# Patient Record
Sex: Female | Born: 1950 | Race: White | Hispanic: No | Smoking: Never smoker
Health system: Southern US, Community
[De-identification: ages and names within clinical notes are randomized; demographics above are authoritative.]

## PROBLEM LIST (undated history)

## (undated) DIAGNOSIS — E119 Type 2 diabetes mellitus without complications: Secondary | ICD-10-CM

## (undated) DIAGNOSIS — I499 Cardiac arrhythmia, unspecified: Secondary | ICD-10-CM

## (undated) DIAGNOSIS — F32A Depression, unspecified: Secondary | ICD-10-CM

## (undated) DIAGNOSIS — K760 Fatty (change of) liver, not elsewhere classified: Secondary | ICD-10-CM

## (undated) DIAGNOSIS — G8929 Other chronic pain: Secondary | ICD-10-CM

## (undated) DIAGNOSIS — E1140 Type 2 diabetes mellitus with diabetic neuropathy, unspecified: Secondary | ICD-10-CM

## (undated) DIAGNOSIS — G4730 Sleep apnea, unspecified: Secondary | ICD-10-CM

## (undated) DIAGNOSIS — T7840XA Allergy, unspecified, initial encounter: Secondary | ICD-10-CM

## (undated) DIAGNOSIS — I10 Essential (primary) hypertension: Secondary | ICD-10-CM

## (undated) DIAGNOSIS — F419 Anxiety disorder, unspecified: Secondary | ICD-10-CM

## (undated) DIAGNOSIS — J45909 Unspecified asthma, uncomplicated: Secondary | ICD-10-CM

## (undated) DIAGNOSIS — F329 Major depressive disorder, single episode, unspecified: Secondary | ICD-10-CM

## (undated) DIAGNOSIS — K7460 Unspecified cirrhosis of liver: Secondary | ICD-10-CM

## (undated) DIAGNOSIS — M1990 Unspecified osteoarthritis, unspecified site: Secondary | ICD-10-CM

## (undated) DIAGNOSIS — K219 Gastro-esophageal reflux disease without esophagitis: Secondary | ICD-10-CM

## (undated) DIAGNOSIS — K5790 Diverticulosis of intestine, part unspecified, without perforation or abscess without bleeding: Secondary | ICD-10-CM

## (undated) DIAGNOSIS — G629 Polyneuropathy, unspecified: Secondary | ICD-10-CM

## (undated) DIAGNOSIS — R05 Cough: Secondary | ICD-10-CM

## (undated) DIAGNOSIS — M549 Dorsalgia, unspecified: Secondary | ICD-10-CM

## (undated) HISTORY — DX: Unspecified asthma, uncomplicated: J45.909

## (undated) HISTORY — PX: PARTIAL HYSTERECTOMY: SHX80

## (undated) HISTORY — PX: FOOT SURGERY: SHX648

## (undated) HISTORY — DX: Gastro-esophageal reflux disease without esophagitis: K21.9

## (undated) HISTORY — DX: Allergy, unspecified, initial encounter: T78.40XA

## (undated) HISTORY — DX: Diverticulosis of intestine, part unspecified, without perforation or abscess without bleeding: K57.90

## (undated) HISTORY — DX: Depression, unspecified: F32.A

## (undated) HISTORY — DX: Type 2 diabetes mellitus with diabetic neuropathy, unspecified: E11.40

## (undated) HISTORY — DX: Anxiety disorder, unspecified: F41.9

## (undated) HISTORY — PX: CARPAL TUNNEL RELEASE: SHX101

## (undated) HISTORY — DX: Cough: R05

## (undated) HISTORY — DX: Major depressive disorder, single episode, unspecified: F32.9

## (undated) HISTORY — PX: TOENAIL EXCISION: SUR558

## (undated) HISTORY — DX: Dorsalgia, unspecified: M54.9

## (undated) HISTORY — DX: Essential (primary) hypertension: I10

## (undated) HISTORY — DX: Other chronic pain: G89.29

## (undated) HISTORY — DX: Unspecified osteoarthritis, unspecified site: M19.90

## (undated) HISTORY — PX: CARDIAC ELECTROPHYSIOLOGY STUDY AND ABLATION: SHX1294

## (undated) HISTORY — PX: OTHER SURGICAL HISTORY: SHX169

## (undated) HISTORY — PX: HEMORROIDECTOMY: SUR656

---
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
## 2002-05-24 ENCOUNTER — Ambulatory Visit (HOSPITAL_COMMUNITY): Admission: RE | Admit: 2002-05-24 | Discharge: 2002-05-24 | Payer: Self-pay | Admitting: *Deleted

## 2002-05-24 ENCOUNTER — Encounter (INDEPENDENT_AMBULATORY_CARE_PROVIDER_SITE_OTHER): Payer: Self-pay | Admitting: Cardiology

## 2008-08-21 ENCOUNTER — Ambulatory Visit: Payer: Self-pay | Admitting: Internal Medicine

## 2009-05-18 DIAGNOSIS — F341 Dysthymic disorder: Secondary | ICD-10-CM | POA: Insufficient documentation

## 2009-05-18 DIAGNOSIS — I10 Essential (primary) hypertension: Secondary | ICD-10-CM | POA: Insufficient documentation

## 2009-05-18 DIAGNOSIS — R002 Palpitations: Secondary | ICD-10-CM | POA: Insufficient documentation

## 2010-01-30 ENCOUNTER — Ambulatory Visit: Payer: Self-pay | Admitting: Unknown Physician Specialty

## 2010-02-12 ENCOUNTER — Ambulatory Visit: Payer: Self-pay | Admitting: Unknown Physician Specialty

## 2010-04-08 ENCOUNTER — Encounter: Payer: Self-pay | Admitting: Pulmonary Disease

## 2010-04-23 ENCOUNTER — Encounter: Payer: Self-pay | Admitting: Pulmonary Disease

## 2010-06-04 ENCOUNTER — Ambulatory Visit: Payer: Self-pay | Admitting: Pulmonary Disease

## 2010-06-04 DIAGNOSIS — R059 Cough, unspecified: Secondary | ICD-10-CM | POA: Insufficient documentation

## 2010-06-04 DIAGNOSIS — R05 Cough: Secondary | ICD-10-CM

## 2010-06-05 ENCOUNTER — Telehealth (INDEPENDENT_AMBULATORY_CARE_PROVIDER_SITE_OTHER): Payer: Self-pay | Admitting: *Deleted

## 2010-07-01 ENCOUNTER — Ambulatory Visit: Payer: Self-pay | Admitting: Pulmonary Disease

## 2010-07-01 ENCOUNTER — Encounter: Payer: Self-pay | Admitting: Pulmonary Disease

## 2010-07-01 DIAGNOSIS — R053 Chronic cough: Secondary | ICD-10-CM

## 2010-07-01 HISTORY — DX: Chronic cough: R05.3

## 2010-07-03 ENCOUNTER — Encounter: Payer: Self-pay | Admitting: Pulmonary Disease

## 2010-07-08 ENCOUNTER — Encounter: Payer: Self-pay | Admitting: Pulmonary Disease

## 2010-07-08 ENCOUNTER — Ambulatory Visit: Payer: Self-pay | Admitting: Pulmonary Disease

## 2010-07-22 ENCOUNTER — Telehealth (INDEPENDENT_AMBULATORY_CARE_PROVIDER_SITE_OTHER): Payer: Self-pay | Admitting: *Deleted

## 2010-07-22 ENCOUNTER — Ambulatory Visit: Payer: Self-pay | Admitting: Pulmonary Disease

## 2010-07-22 DIAGNOSIS — K7689 Other specified diseases of liver: Secondary | ICD-10-CM | POA: Insufficient documentation

## 2010-07-24 ENCOUNTER — Ambulatory Visit: Admission: RE | Admit: 2010-07-24 | Discharge: 2010-07-24 | Payer: Self-pay | Admitting: Pulmonary Disease

## 2010-07-24 ENCOUNTER — Ambulatory Visit: Payer: Self-pay | Admitting: Pulmonary Disease

## 2010-07-25 HISTORY — PX: BRONCHOSCOPY: SUR163

## 2010-07-26 ENCOUNTER — Telehealth: Payer: Self-pay | Admitting: Pulmonary Disease

## 2010-08-14 ENCOUNTER — Ambulatory Visit: Payer: Self-pay | Admitting: Pulmonary Disease

## 2010-08-14 DIAGNOSIS — J988 Other specified respiratory disorders: Secondary | ICD-10-CM

## 2010-08-14 DIAGNOSIS — J398 Other specified diseases of upper respiratory tract: Secondary | ICD-10-CM | POA: Insufficient documentation

## 2010-08-27 ENCOUNTER — Telehealth: Payer: Self-pay | Admitting: Pulmonary Disease

## 2010-08-27 ENCOUNTER — Encounter: Payer: Self-pay | Admitting: Pulmonary Disease

## 2010-09-12 ENCOUNTER — Telehealth (INDEPENDENT_AMBULATORY_CARE_PROVIDER_SITE_OTHER): Payer: Self-pay | Admitting: *Deleted

## 2010-09-18 ENCOUNTER — Telehealth (INDEPENDENT_AMBULATORY_CARE_PROVIDER_SITE_OTHER): Payer: Self-pay | Admitting: *Deleted

## 2010-09-19 ENCOUNTER — Ambulatory Visit: Payer: Self-pay | Admitting: Pulmonary Disease

## 2010-09-19 DIAGNOSIS — K219 Gastro-esophageal reflux disease without esophagitis: Secondary | ICD-10-CM | POA: Insufficient documentation

## 2010-09-20 ENCOUNTER — Telehealth (INDEPENDENT_AMBULATORY_CARE_PROVIDER_SITE_OTHER): Payer: Self-pay | Admitting: *Deleted

## 2011-01-07 NOTE — Miscellaneous (Signed)
Summary: Pulmonary function test   Pulmonary Function Test Date: 07/01/2010 Height (in.): 65 Gender: Female  Pre-Spirometry FVC    Value: 2.84 L/min   Pred: 3.18 L/min     % Pred: 89 % FEV1    Value: 2.17 L     Pred: 2.35 L     % Pred: 92 % FEV1/FVC  Value: 76 %     Pred: 73 %     % Pred: . % FEF 25-75  Value: 1.98 L/min   Pred: 2.65 L/min     % Pred: 75 %  Post-Spirometry FVC    Value: 2.72 L/min   Pred: 3.18 L/min     % Pred: 85 % FEV1    Value: 2.20 L     Pred: 2.35 L     % Pred: 93 % FEV1/FVC  Value: 81 %     Pred: 73 %     % Pred: . % FEF 25-75  Value: 2.35 L/min   Pred: 2.65 L/min     % Pred: 88 %  Lung Volumes TLC    Value: 4.12 L   % Pred: 81 % RV    Value: 12.8 L   % Pred: 66 % DLCO    Value: 18.6 %   % Pred: 78 % DLCO/VA  Value: 5.06 %   % Pred: 135 %  Comments: Normal spirometry.  Normal lung volumes.  Mild diffusion defect.  No bronchodilator response. Clinical Lists Changes  Observations: Added new observation of PFT COMMENTS: Normal spirometry.  Normal lung volumes.  Mild diffusion defect.  No bronchodilator response. (07/01/2010 11:02) Added new observation of DLCO/VA%EXP: 135 % (07/01/2010 11:02) Added new observation of DLCO/VA: 5.06 % (07/01/2010 11:02) Added new observation of DLCO % EXPEC: 78 % (07/01/2010 11:02) Added new observation of DLCO: 18.6 % (07/01/2010 11:02) Added new observation of RV % EXPECT: 66 % (07/01/2010 11:02) Added new observation of RV: 12.8 L (07/01/2010 11:02) Added new observation of TLC % EXPECT: 81 % (07/01/2010 11:02) Added new observation of TLC: 4.12 L (07/01/2010 11:02) Added new observation of FEF2575%EXPS: 88 % (07/01/2010 11:02) Added new observation of PSTFEF25/75P: 2.65  (07/01/2010 11:02) Added new observation of PSTFEF25/75%: 2.35 L/min (07/01/2010 11:02) Added new observation of PSTFEV1/FCV%: . % (07/01/2010 11:02) Added new observation of FEV1FVCPRDPS: 73 % (07/01/2010 11:02) Added new observation of PSTFEV1/FVC:  81 % (07/01/2010 11:02) Added new observation of POSTFEV1%PRD: 93 % (07/01/2010 11:02) Added new observation of FEV1PRDPST: 2.35 L (07/01/2010 11:02) Added new observation of POST FEV1: 2.20 L/min (07/01/2010 11:02) Added new observation of POST FVC%EXP: 85 % (07/01/2010 11:02) Added new observation of FVCPRDPST: 3.18 L/min (07/01/2010 11:02) Added new observation of POST FVC: 2.72 L (07/01/2010 11:02) Added new observation of FEF % EXPEC: 75 % (07/01/2010 11:02) Added new observation of FEF25-75%PRE: 2.65 L/min (07/01/2010 11:02) Added new observation of FEF 25-75%: 1.98 L/min (07/01/2010 11:02) Added new observation of FEV1/FVC%EXP: . % (07/01/2010 11:02) Added new observation of FEV1/FVC PRE: 73 % (07/01/2010 11:02) Added new observation of FEV1/FVC: 76 % (07/01/2010 11:02) Added new observation of FEV1 % EXP: 92 % (07/01/2010 11:02) Added new observation of FEV1 PREDICT: 2.35 L (07/01/2010 11:02) Added new observation of FEV1: 2.17 L (07/01/2010 11:02) Added new observation of FVC % EXPECT: 89 % (07/01/2010 11:02) Added new observation of FVC PREDICT: 3.18 L (07/01/2010 11:02) Added new observation of FVC: 2.84 L (07/01/2010 11:02) Added new observation of PFT HEIGHT: 65  (07/01/2010 11:02) Added new observation of  PFT DATE: 07/01/2010  (07/01/2010 11:02)

## 2011-01-07 NOTE — Procedures (Signed)
Summary: Esophageal pH Study/[facility]  Esophageal pH [facility]   Imported By: Sherian Rein 06/24/2010 10:19:45  _____________________________________________________________________  External Attachment:    Type:   Image     Comment:   External Document

## 2011-01-07 NOTE — Letter (Signed)
Summary: [facility]   Imported By: Sherian Rein 06/24/2010 10:18:13  _____________________________________________________________________  External Attachment:    Type:   Image     Comment:   External Document

## 2011-01-07 NOTE — Progress Notes (Signed)
Summary: talk to dr sood----appt with VS for 09/19/2010  Phone Note Call from Patient Call back at [phone]   Caller: Patient Call For: sood Summary of Call: need to talk to dr sood about surgery at [facility]. call pt after 2pm Initial call taken by: Rickard Patience,  September 18, 2010 8:50 AM  Follow-up for Phone Call        I just spoke with pt.  She needs to have [name] to discuss options. Follow-up by: Coralyn Helling MD,  September 18, 2010 11:07 AM  Additional Follow-up for Phone Call Additional follow up Details #1::        Lawson Fiscal has already spoke with this pt today and pt is scheduled to see VS tomorrow at 3:45pm.  Arman Filter LPN  September 18, 2010 11:41 AM

## 2011-01-07 NOTE — Miscellaneous (Signed)
Summary: Orders Update pft charges  Clinical Lists Changes  Orders: Added new Service order of Carbon Monoxide diffusing w/capacity (94720) - Signed Added new Service order of Lung Volumes (94240) - Signed Added new Service order of Spirometry (Pre & Post) (94060) - Signed 

## 2011-01-07 NOTE — Progress Notes (Signed)
Summary: speak to dr Craige Cotta  Phone Note Call from Patient Call back at [facility] Phone [phone]   Caller: Patient Call For: sood Summary of Call: pt requests to speak to VS. says she had surgery at [facility]. is home now. she is aware that he is not in office at this time. she will give more details to nurse.  Initial call taken by: Tivis Ringer, CNA,  September 12, 2010 10:21 AM  Follow-up for Phone Call        Pt staets she went to [facility] yesterday for Bronch. She states they were running 3 hours behind and when they did the Bronch she was not put under and had to be help down in order for the mto do procedure.  Pt states MD told her she does not need stents. He did some cultures and she is waiting on results of that. Pt states she does not want to go back to [facility]. She is requesting Dr. Craige Cotta call her to discuss. Please advise. Carron Curie CMA  September 12, 2010 10:54 AM   Additional Follow-up for Phone Call Additional follow up Details #1::        Spoke with Dr. Carmie Kanner, and informed Ms. Rosser about results of eval at [facility].  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  She continues to have a cough.  Will have my nurse call to schedule next available [name] to further assess her chronic cough. Additional Follow-up by: Coralyn Helling MD,  September 18, 2010 11:06 AM    Additional Follow-up for Phone Call Additional follow up Details #2::    The patient is sch for follow-up on Thurs, 09/19/2010 @ 3:45 with Dr. Craige Cotta. Follow-up by: Michel Bickers CMA,  September 18, 2010 11:39 AM

## 2011-01-07 NOTE — Progress Notes (Signed)
Summary: speka to Dr. Craige Cotta about appt at [facility] Note Call from Patient Call back at Work Phone [phone]   Caller: Patient Call For: sood Summary of Call: Pt states she wants to talk to VS in ref to her appt at [facility]. Initial call taken by: Darletta Moll,  August 27, 2010 4:28 PM  Follow-up for Phone Call        I would show to VS in am  I am unfamiliar with the case and he will know better what to say he is back in am Follow-up by: Storm Frisk MD,  August 27, 2010 4:30 PM  Additional Follow-up for Phone Call Additional follow up Details #1::        ATC pt to get more info on this message. This message was sent directly to PW's box without going through Triage first for [place] to call to get more info on.   Called pt's work #.  Pt has left for the day.  called pt's home # and [id number].  Aundra Millet Reynolds LPN  August 27, 2010 4:45 PM   pt wants to speak to VS.  She went to [facility] and they want to do another Bronchoscopy and a sleep study on the pt.  Pt says VS just did a bronchoscopy on her a few weeks ago and sent to Dr. at [facility].  She has had a sleep study done too.  Pt is concerned that she doesn't need to have all this done again.  Dr. at [facility] says that if Stints don't work, they will have to go in and do more surgery also.  Dr. at [facility] is next step to take according to VS.   Additional Follow-up by: Eugene Gavia,  August 28, 2010 8:45 AM    Additional Follow-up for Phone Call Additional follow up Details #2::    Pt staets that Dr. Carmie Kanner wants to do another bronch on Oct 5th and she wants to speak to Dr. Craige Cotta about this since she just had one recently with Dr. Craige Cotta.  I called Dr. Ruben Reason office to get OV note faxed but was instructed that it was not completed yet. Pt saw Dr. Carmie Kanner on 08-27-10.  She is requesting to speak directly to Dr. Craige Cotta to discuss Dr. Ruben Reason plan. Please advise. Carron Curie CMA  August 28, 2010 9:18 AM   Additional  Follow-up for Phone Call Additional follow up Details #3:: Details for Additional Follow-up Action Taken: Spoke with pt over the phone.  Explained to her that I was anticipating that Dr. Carmie Kanner would want to repeat bronchoscopy, and then at that time likely decide about whether she would be a candidate for airway stenting.  Also explained to her that the likely indication for repeat sleep test is to see if she has sleep apnea.  If she is found to have OSA, then she could also benefit from PAP therapy to tx her sleep apnea as well as to help maintain airway patency.  She was told that bronchoscopy will be scheduled at [facility] at beginning of October.  She will d/w her primary physician about getting sleep test scheduled at [facility].  Advised her to contact our office if she has difficulty scheduling sleep test, and then we can arrange for her to have sleep test in our lab. Additional Follow-up by: Coralyn Helling MD,  August 28, 2010 9:53 AM

## 2011-01-07 NOTE — Assessment & Plan Note (Signed)
Summary: rov 3 wks ///kp   Visit Type:  Follow-up Copy to:  Catheryn Bacon Primary Josiel Gahm/Referring [place]:  Jerl Mina  CC:  Bronchoscopy follow-up...patient says her cough has not changed or gone away...occasional SOB.  History of Present Illness: 60 yo female never-smoker with chronic cough.  Bronchoscopy from August 18 showed near complete collapse of her trachea extending to both mainstem bronchi with exhalation to the point that the bronchoscope could not be advanced until she inhaled again.  She was also noted to have possible intra-cellular bacteria on pathologist smear review, but cultures were negative.  Her BAL cell count was as follows: WBC 325,  Neutrophils 20%, Lymphocytes 25%, Monocyte/Macrophage 54%, Eosinophils 1%.  She has taken two weeks of zithromax without any difference in her cough.  She continues to have dry cough and throat irritation.  Her symptoms have been essentially unchanged since I have seen her.     Current Medications (verified): 1)  Amlodipine Besylate 5 Mg Tabs (Amlodipine Besylate) .... Once Daily 2)  Eq Omeprazole 20 Mg Tbec (Omeprazole) .... Two Times A Day 3)  Albuterol Sulfate (5 Mg/ml) 0.5% Nebu (Albuterol Sulfate) .... As Needed 4)  Citalopram Hydrobromide 40 Mg Tabs (Citalopram Hydrobromide) .... Take 1 Tablet Daily 5)  Clonazepam 1 Mg Tbdp (Clonazepam) .... Take 1 Tablet Bid 6)  Aleve 220 Mg Caps (Naproxen Sodium) .... 2 Tablets Qid As Needed  Allergies (verified): 1)  ! * Sulfur 2)  ! * Penicillian 3)  ! Biaxin 4)  ! * Emycin 5)  ! Doxycycline 6)  ! Paulo Fruit  Past History:  Past Surgical History: Partial hysterectomy Carpel tunnel release Foot surgery Catheter ablation for arrhythmia Bronchoscopy 07/25/10  Family History: Reviewed history from 06/04/2010 and no changes required. Mother - DM, macular degeneration Father - brain cancer Brother - throat cancer, blood clot  Social History: Reviewed history from  07/01/2010 and no changes required. Married.  Two children.  Denies tobacco or alcohol use.  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   Vital Signs:  Patient profile:   60 year old female Height:      65 inches (165.10 cm) Weight:      202 pounds (91.82 kg) BMI:     33.74 O2 Sat:      92 % on Room air Temp:     98.0 degrees F (36.67 degrees C) oral Pulse rate:   82 / minute BP sitting:   138 / 80  (right arm) Cuff size:   regular  Vitals Entered By: Michel Bickers CMA (August 14, 2010 4:30 PM)  O2 Sat at Rest %:  92 O2 Flow:  Room air CC: Bronchoscopy follow-up...patient says her cough has not changed or gone away...occasional SOB Comments Medications reviewed with the patient. Daytime phone verified. Michel Bickers [facility]  August 14, 2010 4:37 PM   Physical Exam  General:  obese.   Ears:  mild cerumen build up Nose:  clear nasal discharge, no sinus tenderness Mouth:  MP 3, no exudate Neck:  no JVD.   Lungs:  clear bilaterally to auscultation and percussion Heart:  regular rate and rhythm, S1, S2 without murmurs, rubs, gallops, or clicks Abdomen:  bowel sounds positive; abdomen soft and non-tender without masses, or organomegaly Extremities:  no clubbing, cyanosis, edema, or deformity noted Neurologic:  normal CN II-XII and strength normal.   Cervical Nodes:  no significant adenopathy Psych:  anxious.     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  Looking back at her PFT from July she did have truncation of the expiratory limb of the flow volume curve which would be consistent with intra-thoracic variable obstruction.  I will refer to Dr. Carmie Kanner at [facility] to assess whether she may be a candidate for airway stenting to see if this could be the cause of her cough.  I have explained to her that this would be an unusual, but not unheard of,  cause for chronic cough.  Problem # 2:  COUGH (ICD-786.2) She has undergone extensive evalution.  Except for findings detailed above her evaluation, and response to various therapies, has been unsuccessful.     Problem # 3:  ANXIETY DEPRESSION (ICD-300.4) Certain she could have a psychogenic component to her cough.  Would like to completely exclude all other possible causes first before focusing on this part.  Complete Medication List: 1)  Amlodipine Besylate 5 Mg Tabs (Amlodipine besylate) .... Once daily 2)  Eq Omeprazole 20 Mg Tbec (Omeprazole) .... Two times a day 3)  Albuterol Sulfate (5 Mg/ml) 0.5% Nebu (Albuterol sulfate) .... As needed 4)  Citalopram Hydrobromide 40 Mg Tabs (Citalopram hydrobromide) .... Take 1 tablet daily 5)  Clonazepam 1 Mg Tbdp (Clonazepam) .... Take 1 tablet bid 6)  Aleve 220 Mg Caps (Naproxen sodium) .... 2 tablets qid as needed  Other Orders: Est. Patient Level III (16109) Pulmonary Referral (Pulmonary)  Patient Instructions: 1)  Will refer to [facility] Pulmonary department to assess for tracheal stent 2)  Follow up in 2 months

## 2011-01-07 NOTE — Consult Note (Signed)
Summary: Pulmonary/[facility]  Pulmonary/[facility]   Imported By: Sherian Rein 09/23/2010 10:24:57  _____________________________________________________________________  External Attachment:    Type:   Image     Comment:   External Document

## 2011-01-07 NOTE — Progress Notes (Signed)
  Phone Note Other Incoming   Request: Send information Summary of Call: Records received from [facility]. 4 pages forwarded to Dr. Craige Cotta for review. Lawson Fiscal made aware.

## 2011-01-07 NOTE — Progress Notes (Signed)
  Phone Note Outgoing Call   Call placed by: Coralyn Helling MD,  July 26, 2010 4:11 PM Call placed to: Patient Summary of Call: Preliminary bronchoscopy results reviewed with patient.  Explained that BAL smear showed intracellular and extracellular bacteria.  She could have Chlamydial infection which is contributing to her cough.  She has used doxycycline before, but developed palpitations from this.  She has used azythromycin before, and tolerated this well.    I will give her one week course of azythromycin 500 mg once daily.  I will give her one refill, and advised her to refill the script if her cough has not improved.  Also explained that she had bronchoscopic evidence suggestive of tracheomalacia.  Will re-assess whether specific interventions are needed for this at her [name] in Sept. 2011. Initial call taken by: Coralyn Helling MD,  July 26, 2010 4:14 PM    New/Updated Medications: AZITHROMYCIN 500 MG TABS (AZITHROMYCIN) one once daily Prescriptions: AZITHROMYCIN 500 MG TABS (AZITHROMYCIN) one once daily  #7 x 1   Entered and Authorized by:   Coralyn Helling MD   Signed by:   Coralyn Helling MD on 07/26/2010   Method used:   Electronically to        [facility]  [employer] [phone]* (retail)       [address] [place], [place]  [address]       Ph: [phone] or [phone]       Fax: [phone]   RxID:   [id number]

## 2011-01-07 NOTE — Op Note (Signed)
Summary: Bronch/[facility]  Bronch/[facility]   Imported By: Lester [place] 08/19/2010 08:18:49  _____________________________________________________________________  External Attachment:    Type:   Image     Comment:   External Document

## 2011-01-07 NOTE — Progress Notes (Signed)
Summary: hold off on [facility] referral----  Phone Note Call from Patient   Caller: pt Call For: sood Summary of Call: Pt states she feels like the Dexilant is helping and she wants to hold off on [facility] referral.  [facility] aware, i will forward message to Dr. Craige Cotta as well. Carron Curie CMA  September 20, 2010 4:00 PM   Follow-up for Phone Call        [place].  Please advise her that we will need to work out refilling her dexilant if this continues to improve her cough.  Advise her that she may need to have this refilled by her primary physician if she is not going to follow up with GI at [facility]. Follow-up by: Coralyn Helling MD,  September 23, 2010 1:38 PM  Additional Follow-up for Phone Call Additional follow up Details #1::        [id number] to advise if dexilant helps needs to get refills through PCP. Carron Curie CMA  September 23, 2010 2:30 PM   [facility].  Aundra Millet Reynolds LPN  September 24, 2010 12:01 PM      Additional Follow-up for Phone Call Additional follow up Details #2::    pt returned our call.  informed her of VS' recs and that she will need to get refills of Dexilant through her pcp.  Pt verbalized understanding and denied any questions.  Aundra Millet Reynolds LPN  September 25, 2010 9:49 AM

## 2011-01-07 NOTE — Assessment & Plan Note (Signed)
Summary: rov after pft ///kp   Copy to:  Laura [name] Primary Provider/Referring Provider:  Lynnae [name]  CC:  Pt here for follow up after PFT. Pt states breathing and cough is the same.  History of Present Illness: 60 yo female with chronic cough.  She still has a dry cough, and feels like she has phlegm in her throat.  She has not noticed any difference with her sinus regimen.  She still gets occasional wheeze and chest tightness.  She denies fever.  She denies reflux.  She has been using tussionex and this seems to help.    Pulmonary function test from July 01, 2010 showed mild diffusion defect, otherwise normal.  Preventive Screening-Counseling & Management  Alcohol-Tobacco     Smoking Status: never  Current Medications (verified): 1)  Amlodipine Besylate 5 Mg Tabs (Amlodipine Besylate) .... Once Daily 2)  Eq Omeprazole 20 Mg Tbec (Omeprazole) .... Two Times A Day 3)  Albuterol Sulfate (5 Mg/ml) 0.5% Nebu (Albuterol Sulfate) .... As Needed 4)  Singulair 10 Mg Tabs (Montelukast Sodium) .... Take 1 Tablet Every Day 5)  Nasonex 50 Mcg/act Susp (Mometasone Furoate) .... Two Sprays Once Daily 6)  Citalopram Hydrobromide 40 Mg Tabs (Citalopram Hydrobromide) .... Take 1 Tablet Daily 7)  Clonazepam 1 Mg Tbdp (Clonazepam) .... Take 1 Tablet Bid 8)  Aleve 220 Mg Caps (Naproxen Sodium) .... 2 Tablets Qid As Needed 9)  Hydrocodone-Homalropin Syrup .... As Needed 10)  Chlorpheniramine Maleate 4 Mg Tabs (Chlorpheniramine Maleate) .... One To Two Pills At Bedtime For Allergies and Sinus For One Week, Then As Needed  Allergies (verified): 1)  ! * Sulfur 2)  ! * Penicillian 3)  ! Biaxin 4)  ! * Emycin 5)  ! Doxycycline 6)  ! Paulo Fruit  Past History:  Past Medical History: Hypertension Asthma       - PFT 07/01/10 FEV1 2.20(93%), FEV1% 81, TLC 4.12(81%), DLCO 78%, no BD Anxiety Depression Chronic back pain GERD Diverticulosis Hemorrhoids Osteoarthritis  Past Surgical  History: Reviewed history from 06/04/2010 and no changes required. Partial hysterectomy Carpel tunnel release Foot surgery Catheter ablation for arrhythmia  Social History: Married.  Two children.  Denies tobacco or alcohol use.  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   Vital Signs:  Patient profile:   60 year old female Height:      65 inches Weight:      198 pounds BMI:     33.07 O2 Sat:      92 % on Room air Temp:     97.8 degrees F oral Pulse rate:   73 / minute BP sitting:   130 / 80  (right arm) Cuff size:   regular  Vitals Entered By: Laura [name] CMA (July 01, 2010 12:14 PM)  O2 Flow:  Room air CC: Pt here for follow up after PFT. Pt states breathing and cough is the same Comments Medications reviewed with patient Verified contact number and pharmacy with patient Laura [name] CMA  July 01, 2010 12:15 PM    Physical Exam  General:  obese.   Nose:  clear nasal discharge, no sinus tenderness Mouth:  MP 3, mild erythema of posterior pharynx with cobblestone appearance Neck:  no JVD.   Lungs:  clear bilaterally to auscultation and percussion Heart:  regular rate and rhythm, S1, S2 without murmurs, rubs, gallops, or clicks Extremities:  no clubbing, cyanosis, edema, or deformity noted Cervical Nodes:  no significant adenopathy   Impression & Recommendations:  Problem # 1:  COUGH (ICD-786.2) Will check CT sinus and chest.  Will give course of prednisone.  She is to continue her sinus regimen, singulair, and as needed albuterol.  Will refill tussionex.  Medications Added to Medication List This Visit: 1)  Tussionex Pennkinetic Er 8-10 Mg/74ml Lqcr (Chlorpheniramine-hydrocodone) .... 5 ml two times a day as needed 2)  Prednisone 10 Mg Tabs (Prednisone) .... 3 pills once daily for 2 days, 2 pills once daily for 2 days, 1 pill once daily for 2 days, 1/2 pill once daily for 2 days  Complete Medication List: 1)  Amlodipine Besylate 5 Mg Tabs  (Amlodipine besylate) .... Once daily 2)  Eq Omeprazole 20 Mg Tbec (Omeprazole) .... Two times a day 3)  Albuterol Sulfate (5 Mg/ml) 0.5% Nebu (Albuterol sulfate) .... As needed 4)  Singulair 10 Mg Tabs (Montelukast sodium) .... Take 1 tablet every day 5)  Nasonex 50 Mcg/act Susp (Mometasone furoate) .... Two sprays once daily 6)  Citalopram Hydrobromide 40 Mg Tabs (Citalopram hydrobromide) .... Take 1 tablet daily 7)  Clonazepam 1 Mg Tbdp (Clonazepam) .... Take 1 tablet bid 8)  Aleve 220 Mg Caps (Naproxen sodium) .... 2 tablets qid as needed 9)  Hydrocodone-homalropin Syrup  .... As needed 10)  Tussionex Pennkinetic Er 8-10 Mg/26ml Lqcr (Chlorpheniramine-hydrocodone) .... 5 ml two times a day as needed 11)  Prednisone 10 Mg Tabs (Prednisone) .... 3 pills once daily for 2 days, 2 pills once daily for 2 days, 1 pill once daily for 2 days, 1/2 pill once daily for 2 days  Other Orders: Est. Patient Level III (78469) Radiology Referral (Radiology)  Patient Instructions: 1)  Prednisone 10 mg pill:  3 pills once daily for 2 days, 2 pills once daily for 2 days, 1 pill once daily for 2 days, 1/2 pill once daily for 2 days 2)  Tussionex 5 ml two times a day as needed 3)  Will schedule CT sinus and chest 4)  Follow up in 3 to 4 weeks Prescriptions: PREDNISONE 10 MG TABS (PREDNISONE) 3 pills once daily for 2 days, 2 pills once daily for 2 days, 1 pill once daily for 2 days, 1/2 pill once daily for 2 days  #13 x 0   Entered and Authorized by:   Coralyn Helling MD   Signed by:   Coralyn Helling MD on 07/01/2010   Method used:   Print then Give to Patient   RxID:   [id number] TUSSIONEX PENNKINETIC ER 8-10 MG/5ML LQCR (CHLORPHENIRAMINE-HYDROCODONE) 5 ml two times a day as needed  #60 ml x 1   Entered and Authorized by:   Coralyn Helling MD   Signed by:   Coralyn Helling MD on 07/01/2010   Method used:   Print then Give to Patient   RxID:   [id number]    Immunization History:  Influenza Immunization  History:    Influenza:  historical (10/08/2009)

## 2011-01-07 NOTE — Progress Notes (Signed)
Summary: Bronch  Phone Note Call from Patient Call back at Work Phone [phone]   Caller: Patient Call For: sood Reason for Call: Talk to Nurse Summary of Call: re: Bronchoscopy, could you do it this week, Wed, Thurs or Friday?   Initial call taken by: Eugene Gavia,  July 22, 2010 1:47 PM  Follow-up for Phone Call        Pt would like bronch to be done this Wednesday, Thurday, or Friday.  Will forward message to Dr. Craige Cotta.  Pls advise if one of this dates will work for you.  Gweneth Dimitri RN  July 22, 2010 2:43 PM   Additional Follow-up for Phone Call Additional follow up Details #1::        She has been scheduled for bronchoscopy on Wednesday, July 24, 2010 at 11 am.  I advised her to go to outpatient admitting 1 hour before the scheduled procedure time.  It is okay for her to take her morning dose of amlodipine with a small amount of water.  She should not take any of her other medications until after the procedure.  Otherwise advised her to not eat or drink anything after midnight on the night prior to the test.  Advised her to make driving arrangements to and from the hospital. Additional Follow-up by: Coralyn Helling MD,  July 23, 2010 9:02 AM    Additional Follow-up for Phone Call Additional follow up Details #2::    Spoke with patient; she states VS has went thru the procedure and guidelines with her; she will call her insurance and admitting for any payments that may be due.Reynaldo Minium CMA  July 23, 2010 9:10 AM

## 2011-01-07 NOTE — Assessment & Plan Note (Signed)
Summary: 3 WEEK RETURN/[facility]   Visit Type:  Follow-up Copy to:  Catheryn Bacon Primary Provider/Referring Provider:  Jerl Mina  CC:  Cough follow-up. Review CT chest and sinus results.  The patient c/o a dry cough...no worse or better. On Prednisone taper 1/2 po daily.Marland Kitchen  History of Present Illness: 60 yo female never-smoker with chronic cough.  She continues to have cough.  She has not noticed any benefit from using prednisone.  She is not bringing up sputum, and does not have wheeze.  She has not noticed any difference from her sinus regimen.  She continues to use omeprazole, but does not feel this has made any difference.  Pulmonary function test from July 01, 2010 showed mild diffusion defect, otherwise normal.  CT chest with IV contrast from July 08, 2010 showed fatty liver, but otherwise was unremarkable.  CT sinus from July 08, 2010 was normal.    Current Medications (verified): 1)  Amlodipine Besylate 5 Mg Tabs (Amlodipine Besylate) .... Once Daily 2)  Eq Omeprazole 20 Mg Tbec (Omeprazole) .... Two Times A Day 3)  Albuterol Sulfate (5 Mg/ml) 0.5% Nebu (Albuterol Sulfate) .... As Needed 4)  Singulair 10 Mg Tabs (Montelukast Sodium) .... Take 1 Tablet Every Day 5)  Nasonex 50 Mcg/act Susp (Mometasone Furoate) .... Two Sprays Once Daily 6)  Citalopram Hydrobromide 40 Mg Tabs (Citalopram Hydrobromide) .... Take 1 Tablet Daily 7)  Clonazepam 1 Mg Tbdp (Clonazepam) .... Take 1 Tablet Bid 8)  Aleve 220 Mg Caps (Naproxen Sodium) .... 2 Tablets Qid As Needed 9)  Hydrocodone-Homalropin Syrup .... As Needed 10)  Tussionex Pennkinetic Er 8-10 Mg/33ml Lqcr (Chlorpheniramine-Hydrocodone) .... 5 Ml Two Times A Day As Needed 11)  Prednisone 10 Mg Tabs (Prednisone) .... 3 Pills Once Daily For 2 Days, 2 Pills Once Daily For 2 Days, 1 Pill Once Daily For 2 Days, 1/2 Pill Once Daily For 2 Days  Allergies (verified): 1)  ! * Sulfur 2)  ! * Penicillian 3)  ! Biaxin 4)  ! * Emycin 5)  !  Doxycycline 6)  ! Paulo Fruit  Past History:  Past Medical History: Hypertension Chronic cough      - PFT 07/01/10 FEV1 2.20(93%), FEV1% 81, TLC 4.12(81%), DLCO 78%, no BD      - Normal CT chest, sinus 07/08/10 Anxiety Depression Chronic back pain GERD Diverticulosis Hemorrhoids Osteoarthritis  Past Surgical History: Reviewed history from 06/04/2010 and no changes required. Partial hysterectomy Carpel tunnel release Foot surgery Catheter ablation for arrhythmia  Vital Signs:  Patient profile:   60 year old female Height:      65 inches (165.10 cm) Weight:      199.38 pounds (90.63 kg) BMI:     33.30 O2 Sat:      96 % on Room air Temp:     97.6 degrees F (36.44 degrees C) oral Pulse rate:   67 / minute BP sitting:   120 / 756  (left arm) Cuff size:   regular  Vitals Entered By: Michel Bickers CMA (July 22, 2010 11:35 AM)  O2 Sat at Rest %:  96 O2 Flow:  Room air CC: Cough follow-up. Review CT chest and sinus results.  The patient c/o a dry cough...no worse or better. On Prednisone taper 1/2 po daily. Comments Medications reviewed. Daytime phone verified. Michel Bickers [facility]  July 22, 2010 11:42 AM   Physical Exam  General:  obese.   Ears:  mild cerumen build up Nose:  clear nasal discharge, no  sinus tenderness Mouth:  MP 3, mild erythema of posterior pharynx with cobblestone appearance Neck:  no JVD.   Extremities:  no clubbing, cyanosis, edema, or deformity noted Cervical Nodes:  no significant adenopathy   Impression & Recommendations:  Problem # 1:  COUGH (ICD-786.2)  She has not responded to therapies directed at asthma, sinus disease with post-nasal drip, or reflux.  She does not have a history of smoking.  Her pulmonary function tests, and CT of chest and sinus were unremarkable.  I will stop nasonex and singulair.  She can continue as needed albuterol.  Will have her finish, but not renew, her course of prednisone.    I will also schedule her for  bronchoscopy to assess her upper airway further, and to obtain airway sampling for culture and cell count.  I have explained the procedure to the patient.  Risks were detailed as bleeding, infection, pneumothorax, and non-diagnosis.  She will contact my office about a suitable date for her to have the procedure.  Problem # 2:  FATTY LIVER DISEASE (ICD-571.8)  She had incidental finding of fatty liver on CT chest.  In a person without history of alcohol abuse, I explained to her that this can often times be associated with lipid abnormalities and diabetes.  I have advised her to follow up with her primary care physician for further assessment of this.  She may need further GI evaluation also.  Complete Medication List: 1)  Amlodipine Besylate 5 Mg Tabs (Amlodipine besylate) .... Once daily 2)  Eq Omeprazole 20 Mg Tbec (Omeprazole) .... Two times a day 3)  Albuterol Sulfate (5 Mg/ml) 0.5% Nebu (Albuterol sulfate) .... As needed 4)  Citalopram Hydrobromide 40 Mg Tabs (Citalopram hydrobromide) .... Take 1 tablet daily 5)  Clonazepam 1 Mg Tbdp (Clonazepam) .... Take 1 tablet bid 6)  Aleve 220 Mg Caps (Naproxen sodium) .... 2 tablets qid as needed 7)  Hydrocodone-homalropin Syrup  .... As needed 8)  Tussionex Pennkinetic Er 8-10 Mg/76ml Lqcr (Chlorpheniramine-hydrocodone) .... 5 ml two times a day as needed 9)  Prednisone 10 Mg Tabs (Prednisone) .... 3 pills once daily for 2 days, 2 pills once daily for 2 days, 1 pill once daily for 2 days, 1/2 pill once daily for 2 days  Other Orders: Est. Patient Level III (60454)  Patient Instructions: 1)  Stop singulair and nasonex 2)  Finish course of prednisone 3)  Call to schedule appointment for bronchoscopy at [facility] 4)  Follow up in 2 to 3 weeks

## 2011-01-07 NOTE — Assessment & Plan Note (Signed)
Summary: chronic bronchitis/jd   Copy to:  Catheryn Bacon Primary Provider/Referring Provider:  Lynnae Prude  CC:  Chronic cough.  History of Present Illness: 60 yo female with chronic cough.  She is a self-referral.  She has notice a cough for several years, and was told that she has asthma and bronchitis.  This has been getting worse over the past one year.  She was told that she had pneumonia twice this past year, and once the year before.  She was never hospitalized for her previous pneumonias.  She gets a dry cough associated with a globus sensation.  She feels like she has congestion in her upper chest, but can't bring up any sputum.  She does not get wheezing and denies hemoptysis.  She will get winded on occasional when she exerts herself, but otherwise does not feel like her breathing stops her from doing activity.  She denies fever or sweats.  She will occasionally clear her throat.  She does get sinus congestion at times, but denies sneezing.  She has been evaluated previously by pulmonary, Dr. Meredeth Ide.  She was tried on advair for possible asthma.  This caused her to get a rash in her throat, but did not doing anything for her cough.  She has been using her nebulizer sporadically, but this doesn't really seem to do much.  She has been seen by an allergist.  She was told that she had multiple allergies, including: ragweed, trees, grass, mold, dust mites, and cats.  She denies nasal polyps, and does not have problems taking aspirin.  She has never been on allergy shots, and is not currently using anti-histamines.  She does snore at night, and will occasionally wake up with palpitations and a cough.  She had previous catheter ablation for an arrhythmia.  She thinks her last chest xray was in January.  She does not recall when she had breathing tests done last.  She denies travel history.  There is no history of smoking.  She works in a day care center.  She does not have animal  exposure.  There is no history of TB.EGD in March 2011 showed esophagitis and barium swallow showed mild reflux.  She was seen by Dr. Pamala Hurry with GI at [facility].  She was given a trial of omeprazole 20 mg two times a day.  She had ambulatory esophageal pH monitoring from Apr 23, 2010.  She currently denies symptoms of reflux.  Current Medications (verified): 1)  Hydrocodone-Homalropin Syrup .... As Needed 2)  Amlodipine Besylate 5 Mg Tabs (Amlodipine Besylate) .... Once Daily 3)  Eq Omeprazole 20 Mg Tbec (Omeprazole) .... Two Times A Day 4)  Singulair 10 Mg Tabs (Montelukast Sodium) .... Take 1 Tablet Every Day 5)  Citalopram Hydrobromide 40 Mg Tabs (Citalopram Hydrobromide) .... Take 1 Tablet Daily 6)  Clonazepam 1 Mg Tbdp (Clonazepam) .... Take 1 Tablet Bid 7)  Albuterol Sulfate (5 Mg/ml) 0.5% Nebu (Albuterol Sulfate) .... As Needed 8)  Aleve 220 Mg Caps (Naproxen Sodium) .... 2 Tablets Qid As Needed  Allergies (verified): 1)  ! * Sulfur 2)  ! * Penicillian 3)  ! Biaxin 4)  ! * Emycin 5)  ! Doxycycline 6)  ! Paulo Fruit  Past History:  Past Medical History: Hypertension Asthma  Anxiety Depression Chronic back pain GERD Diverticulosis Hemorrhoids Osteoarthritis  Past Surgical History: Partial hysterectomy Carpel tunnel release Foot surgery Catheter ablation for arrhythmia  Family History: Mother - DM, macular degeneration Father - brain cancer Brother -  throat cancer, blood clot  Social History: Married.  Two children.  Denies tobacco or alcohol use.  Works in day care center.  Review of Systems       The patient complains of shortness of breath with activity, non-productive cough, weight change, anxiety, depression, hand/feet swelling, and joint stiffness or pain.  The patient denies shortness of breath at rest, productive cough, coughing up blood, chest pain, irregular heartbeats, indigestion, loss of appetite, abdominal pain, difficulty swallowing, sore throat,  tooth/dental problems, headaches, nasal congestion/difficulty breathing through nose, sneezing, itching, rash, change in color of mucus, and fever.    Vital Signs:  Patient profile:   60 year old female Height:      65 inches Weight:      198 pounds BMI:     33.07 O2 Sat:      98 % on Room air Temp:     97.8 degrees F oral Pulse rate:   80 / minute BP sitting:   122 / 90  (right arm) Cuff size:   regular  Vitals Entered By: Denna Haggard, CMA (June 04, 2010 4:10 PM)  O2 Sat at Rest %:  98% O2 Flow:  Room air  Reason for Visit Chronic bronchitis and ashtma  Physical Exam  General:  obese.   Eyes:  PERRLA and EOMI.   Ears:  mild cerumen build up Nose:  clear nasal discharge, no sinus tenderness Mouth:  MP 3, mild erythema of posterior pharynx with cobblestone appearance Neck:  no JVD.   Chest Wall:  no deformities noted Lungs:  clear bilaterally to auscultation and percussion Heart:  regular rate and rhythm, S1, S2 without murmurs, rubs, gallops, or clicks Abdomen:  bowel sounds positive; abdomen soft and non-tender without masses, or organomegaly Msk:  no deformity or scoliosis noted with normal posture Pulses:  pulses normal Extremities:  no clubbing, cyanosis, edema, or deformity noted Neurologic:  CN II-XII grossly intact with normal reflexes, coordination, muscle strength and tone Skin:  intact without lesions or rashes Cervical Nodes:  no significant adenopathy Psych:  anxious.     Impression & Recommendations:  Problem # 1:  COUGH (ICD-786.2) She has a chronic cough.  There is no history of tobacco use.  I will repeat her chest xray to exclude lung parenchymal disease.  She has been on therapy for asthma w/o benefit.  She had recent evaluation for reflux as a cause of her cough at [facility], but this did not improve her symptoms.  She does have a history of allergies, and has symptoms of post-nasal drip.  She also reports previous allergy testing which showed  multiple allergens.  She has symptoms suggestive of sleep apnea, and some of her nocturnal cough could actually be related to sleep disordered breathing.  She may need further evaluation for sleep apnea.  I will repeat her chest xray today.  I will arrange for repeat pulmonary function testing.  I will get copies of her medical records.  She is to continue singulair and as needed albuterol for now.  I have advised her to use nasal irrigation followed by nasonex on a daily basis for at least several weeks.  She is to use chlorpheniramine at bedtime for one week, then as needed after this.  I have advised her to use sugarless candy to maintain salivary flow, and salt water gargles as needed.  Depending on her response to these she may need further allergy evaluation and/or imaging studies of her sinuses.  She  had also questioned whether a bronchoscopy would be needed.  I have advised her that she may need a bronchoscopy at some point, but that there are several interventions we can try before she may need to undergo airway inspection.  Medications Added to Medication List This Visit: 1)  Amlodipine Besylate 5 Mg Tabs (Amlodipine besylate) .... Once daily 2)  Eq Omeprazole 20 Mg Tbec (Omeprazole) .... Two times a day 3)  Albuterol Sulfate (5 Mg/ml) 0.5% Nebu (Albuterol sulfate) .... As needed 4)  Singulair 10 Mg Tabs (Montelukast sodium) .... Take 1 tablet every day 5)  Nasonex 50 Mcg/act Susp (Mometasone furoate) .... Two sprays once daily 6)  Citalopram Hydrobromide 40 Mg Tabs (Citalopram hydrobromide) .... Take 1 tablet daily 7)  Clonazepam 1 Mg Tbdp (Clonazepam) .... Take 1 tablet bid 8)  Aleve 220 Mg Caps (Naproxen sodium) .... 2 tablets qid as needed 9)  Hydrocodone-homalropin Syrup  .... As needed 10)  Chlorpheniramine Maleate 4 Mg Tabs (Chlorpheniramine maleate) .... One to two pills at bedtime for allergies and sinus for one week, then as needed  Complete Medication List: 1)  Amlodipine  Besylate 5 Mg Tabs (Amlodipine besylate) .... Once daily 2)  Eq Omeprazole 20 Mg Tbec (Omeprazole) .... Two times a day 3)  Albuterol Sulfate (5 Mg/ml) 0.5% Nebu (Albuterol sulfate) .... As needed 4)  Singulair 10 Mg Tabs (Montelukast sodium) .... Take 1 tablet every day 5)  Nasonex 50 Mcg/act Susp (Mometasone furoate) .... Two sprays once daily 6)  Citalopram Hydrobromide 40 Mg Tabs (Citalopram hydrobromide) .... Take 1 tablet daily 7)  Clonazepam 1 Mg Tbdp (Clonazepam) .... Take 1 tablet bid 8)  Aleve 220 Mg Caps (Naproxen sodium) .... 2 tablets qid as needed 9)  Hydrocodone-homalropin Syrup  .... As needed 10)  Chlorpheniramine Maleate 4 Mg Tabs (Chlorpheniramine maleate) .... One to two pills at bedtime for allergies and sinus for one week, then as needed  Other Orders: Consultation Level IV (10272) Full Pulmonary Function Test (PFT) T-2 View CXR (71020TC)  Patient Instructions: 1)  Nasal irrigation once daily (saline sinus rinse) 2)  Nasonex two sprays once daily  3)  Chlorpheniramine 8 mg at bedtime for one week, then as needed  4)  Use salt water gargle two times a day as needed  5)  Use sugarless candy to keep mouth moist 6)  Continue singulair 10 mg at bedtime  7)  Albuterol nebulized up to four times per day as needed 8)  Chest xray today 9)  Will schedule breathing test (PFT) 10)  Follow up in 2 to 3 weeks Prescriptions: NASONEX 50 MCG/ACT SUSP (MOMETASONE FUROATE) two sprays once daily  #1 x 3   Entered and Authorized by:   Coralyn Helling MD   Signed by:   Coralyn Helling MD on 06/04/2010   Method used:   Print then Give to Patient   RxID:   [id number]

## 2011-01-07 NOTE — Assessment & Plan Note (Signed)
Summary: rov to assess cough/LC   Copy to:  Catheryn Bacon Primary Provider/Referring Provider:  Jerl Mina  CC:  Pt here for follow-up to discuss chronic cough. .  History of Present Illness: 60 yo female never-smoker with chronic cough.  She had bronchoscopy at [facility].  She had a lot of difficulty after the bronch.  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  She continues to have her cough.  She has informed me today that she did not actually follow up with Dr. Pamala Hurry at [facility] GI dept. after having her EGD and esophageal pH probe.      Current Medications (verified): 1)  Amlodipine Besylate 5 Mg Tabs (Amlodipine Besylate) .... Once Daily 2)  Eq Omeprazole 20 Mg Tbec (Omeprazole) .... Two Times A Day 3)  Albuterol Sulfate (5 Mg/ml) 0.5% Nebu (Albuterol Sulfate) .... As Needed 4)  Citalopram Hydrobromide 40 Mg Tabs (Citalopram Hydrobromide) .... Take 1 Tablet Daily 5)  Clonazepam 1 Mg Tbdp (Clonazepam) .... Take 1 Tablet Bid 6)  Aleve 220 Mg Caps (Naproxen Sodium) .... 2 Tablets Qid As Needed  Allergies (verified): 1)  ! * Sulfur 2)  ! * Penicillian 3)  ! Biaxin 4)  ! * Emycin 5)  ! Doxycycline 6)  ! Paulo Fruit  Past History:  Past Medical History: Last updated: 07/22/2010 Hypertension Chronic cough      - PFT 07/01/10 FEV1 2.20(93%), FEV1% 81, TLC 4.12(81%), DLCO 78%, no BD      - Normal CT chest, sinus 07/08/10 Anxiety Depression Chronic back pain GERD Diverticulosis Hemorrhoids Osteoarthritis  Past Surgical History: Last updated: 08/14/2010 Partial hysterectomy Carpel tunnel release Foot surgery Catheter ablation for arrhythmia Bronchoscopy 07/25/10  Vital Signs:  Patient profile:   60 year old female Height:      65 inches Weight:      202 pounds O2 Sat:      98 % on Room air Temp:     98.0 degrees F oral Pulse rate:   65 / minute BP sitting:   130 / 88  (right arm) Cuff size:   regular  Vitals Entered By: Carron Curie CMA (September 19, 2010 3:30 PM)  O2 Flow:  Room air CC: Pt here for follow-up to discuss chronic cough.  Comments Medications reviewed with patient Carron Curie CMA  September 19, 2010 3:32 PM Daytime phone number verified with patient.    Physical Exam  General:  obese.   Nose:  clear nasal discharge, no sinus tenderness Mouth:  MP 3, no exudate Neck:  no JVD.   Lungs:  clear bilaterally to auscultation and percussion Heart:  regular rate and rhythm, S1, S2 without murmurs, rubs, gallops, or clicks Abdomen:  bowel sounds positive; abdomen soft and non-tender without masses, or organomegaly Extremities:  no clubbing, cyanosis, edema, or deformity noted Neurologic:  normal CN II-XII and strength normal.   Cervical Nodes:  no significant adenopathy Psych:  anxious.     Impression & Recommendations:  Problem # 1:  COUGH (ICD-786.2) Her pulmonary evaluation has not revealed a specific cause for her cough.  I will have her re-assess her reflux as a cause of the cough.  Will also try her on tramadol as a cough suppressant agent.  Problem # 2:  GERD (ICD-530.81) She had evidence for reflux on EGD and pH monitoring from [facility].  She did not follow up with GI after these tests.  I explained that her cough could be related to continued reflux inspite  of being on PPI therapy.  I will have her stop omeprazole and have given her samples of dexilant.  Will also arrange for her to have repeat evaluation with Dr. Pamala Hurry at [facility] GI dept.  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 Carmie Kanner at [facility].  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  It was felt the severity did not warrant stenting, and that this was not likely to be contributing to her cough.  Medications Added to Medication List This Visit: 1)  Dexilant 60 Mg Cpdr (Dexlansoprazole) .... One by mouth once daily 2)  Tramadol Hcl 50 Mg Tabs (Tramadol hcl) .... One by mouth three times a day as  needed  Complete Medication List: 1)  Amlodipine Besylate 5 Mg Tabs (Amlodipine besylate) .... Once daily 2)  Eq Omeprazole 20 Mg Tbec (Omeprazole) .... Two times a day 3)  Albuterol Sulfate (5 Mg/ml) 0.5% Nebu (Albuterol sulfate) .... As needed 4)  Citalopram Hydrobromide 40 Mg Tabs (Citalopram hydrobromide) .... Take 1 tablet daily 5)  Clonazepam 1 Mg Tbdp (Clonazepam) .... Take 1 tablet bid 6)  Aleve 220 Mg Caps (Naproxen sodium) .... 2 tablets qid as needed 7)  Dexilant 60 Mg Cpdr (Dexlansoprazole) .... One by mouth once daily 8)  Tramadol Hcl 50 Mg Tabs (Tramadol hcl) .... One by mouth three times a day as needed  Other Orders: Est. Patient Level III (11914) Gastroenterology Referral (GI) Admin 1st Vaccine (78295) Flu Vaccine 52yrs + (62130)  Patient Instructions: 1)  Tramadol 50 mg three times a day as needed for cough 2)  Dexilant 60 mg once daily.  Stop Omeprazole while using Dexilant 3)  Will arrange for follow up appointment with Dr. Pamala Hurry at [facility] 4)  Follow up in 2 to 3 months Prescriptions: TRAMADOL HCL 50 MG TABS (TRAMADOL HCL) one by mouth three times a day as needed  #30 x 6   Entered and Authorized by:   Coralyn Helling MD   Signed by:   Coralyn Helling MD on 09/19/2010   Method used:   Electronically to        [facility] [employer] [phone]* (retail)       [address] [place], [place]  [address]       Ph: [phone] or [phone]       Fax: [phone]   RxID:   [id number]     Flu Vaccine Consent Questions     Do you have a history of severe allergic reactions to this vaccine? no    Any prior history of allergic reactions to egg and/or gelatin? no    Do you have a sensitivity to the preservative Thimersol? no    Do you have a past history of Guillan-Barre Syndrome? no    Do you currently have an acute febrile illness? no    Have you ever had a severe reaction to latex? no    Vaccine information given and explained to patient?  yes    Are you currently pregnant? no    Lot Number:[id number]   Exp Date:06/07/2011   Site Given  Left Deltoid IMlu Carron Curie CMA  September 19, 2010 4:20 PM

## 2011-02-21 LAB — AFB CULTURE WITH SMEAR (NOT AT ARMC): Acid Fast Smear: NONE SEEN

## 2011-02-21 LAB — BODY FLUID CELL COUNT WITH DIFFERENTIAL
Eos, Fluid: 1 %
Lymphs, Fluid: 25 %
Monocyte-Macrophage-Serous Fluid: 54 % (ref 50–90)
Neutrophil Count, Fluid: 20 % (ref 0–25)
Total Nucleated Cell Count, Fluid: 305 cu mm (ref 0–1000)

## 2011-02-21 LAB — CULTURE, RESPIRATORY W GRAM STAIN

## 2011-02-21 LAB — FUNGUS CULTURE W SMEAR: Fungal Smear: NONE SEEN

## 2011-02-21 LAB — PATHOLOGIST SMEAR REVIEW

## 2011-02-21 LAB — CULTURE, RESPIRATORY

## 2011-02-28 ENCOUNTER — Emergency Department: Payer: Self-pay | Admitting: Emergency Medicine

## 2011-04-22 NOTE — Letter (Signed)
August 21, 2008    Laura [name]  [address] [place].  [place], [place] [address]   RE:  Laura, [name]  MRN:  [id number]  /  DOB:  1950/12/26   Dear Dr. Burnett Sheng:   It was a pleasure to see Laura [name], your cousin's wife, in  consultation regarding her palpitations.   As you know, she is a 60 year old woman with a complex set of complaints  who has a history of palpitations that dates back some years.  She  characterizes these as abrupt in onset, a sensation that her body is  shaking inside, atypically shaking inside.  These spells typically lasts  for just a few seconds.  They occur frequently during the day.  They are  accompanied by a sensation that her blood pressure is falling,  presyncope, and lightheadedness.  She has had no syncope.  She does not  note that they are aggravated by stimulants or caffeine.  There is some  orthostatic intolerance.  She has a history of hypertension.   Your note refers to a catheter ablation procedure.  She does not  recognize the term EP study or catheter ablation.  She describes a  catheter procedure at [facility] which for me is hard to interpret.  It was  done very late in the day, she went home that day and it was done just  on one side.  So, I suspect that this may have been a coronary angiogram  as opposed to an electrophysiological study, but I do not know for sure.   She describes her life as being exceedingly stressful.  She recounted  some of the stories of her husband's very lengthy and difficult stays at  [facility].   Her past cardiac evaluation has included Holter monitor undertaken in  May 2009, which was associated with many of her typical spells and there  are numerous activations.  As you know, the rhythm on this monitor was  entirely normal (see below).  She has also had an ultrasound done  relatively recently by Dr. Gwen Pounds and that report is normal.  The date  of that scan was June 2009.   Her past medical history in addition  to above is notable for:  1. Anxiety/depression.  2. Back pain requiring injections.  3. Arthritis.  4. GE reflux disease.  5. Asthma.   Her past surgical history is notable for  1. Partial hysterectomy.  2. Carpal tunnel.  3. Foot surgery.   SOCIAL HISTORY:  She is married.  She has 2 children.  Does admit to  having stress with her younger daughter.  She does not use cigarettes,  alcohol, or recreational drugs.  She works in a day care center.   Her current medications include Singulair, lisinopril 10, citalopram 40,  clonazepam 1, amlodipine 5, p.r.n. albuterol inhalers.   Her allergies include SULFA, PENICILLIN, ERYTHROMYCIN, LIDOCAINE,  QUESTION BETA-BLOCKERS, LEVAQUIN, BIAXIN, DOXYCYCLINE and at least  specifically listed is ATENOLOL.  She is also reportedly allergic to  TAMIFLU.    On examination, she is a older Caucasian female appearing her stated age  of 63.  She is 5 feet 4 inches, her weight was 197.  Her pulse was 64.  Her blood pressure was 123/78.  With 5 minutes of standing, there is no  significant change in her heart rate and her blood pressure went up  initially to 143 and then drifted back down to 116.  She described  dizziness, blurry vision, feeling funny, swaying  knees, and feeling  shaky on the inside, all with the aforementioned vital signs.  Her HEENT  exam demonstrated no icterus or xanthoma.  The neck veins were flat.  The carotids were brisk and full bilaterally without bruits.  The back  was without kyphosis or scoliosis.  The lungs were clear.  The heart  sounds were regular without murmurs or gallops.  The abdomen was soft  with active bowel sounds without midline pulsation or hepatomegaly.  Femoral pulses were 2+, distal pulses were intact.  There is no  clubbing, cyanosis, or edema.  Neurological exam was grossly normal.  Her skin was warm and dry.   Electrocardiogram dated today demonstrated sinus rhythm of 64 with  intervals of  0.17/0.10/0.48 with a QTc as recorded at 0.49.  Old  electrocardiograms are not available for comparison.   IMPRESSION:  1. Palpitations without discernible arrhythmia.  2. Previous catheter procedure question angiogram versus      electrophysiology study.  3. Anxiety/depression.  4. QT prolongation on the electrocardiogram.   Dr. Burnett Sheng, Laura [name]'s palpitations are unassociated with  arrhythmia.  This was seen in a nonsignificant minority of patients  where up to a third will have no discernible arrhythmia.  I think there  may be some analogy to the syndrome X population and that there are some  patients I think who are exquisitely sensitive to changes in their heart  which we normally do not associate with symptoms, for example, abrupt  changes in heart rate may be symptomatic as it goes from 60 to 80.  The  old record describes her as being allergic to beta-blockers.  I am not  quite sure what this means, but if there are beta-blockers that could be  tried in low dose, it may help get [place] a little bit of some assistance  here.  The other thing that was done in the syndrome X people, the idea  was that their brains were exquisitely sensitive to changes in their  heart in ways again that we cannot discern and amitriptyline was used to  try to help improve the filtering if you were to decrease the nerve  trapping into the brain with at least some success.  Biofeedback is  another alternative.   I will plan to talk with you about her directly so we come up with a  specific plan.   If there is anything further I can do, please do not hesitate to contact  me.   My number is [phone] and feel free to call whenever.    Sincerely,      Duke Salvia, MD, [facility]  Electronically Signed    SCK/MedQ  DD: 08/21/2008  DT: 08/22/2008  Job #: [id number]   CC:    Arnoldo Hooker, MD

## 2011-05-29 ENCOUNTER — Encounter: Payer: Self-pay | Admitting: Cardiovascular Disease

## 2011-11-19 DIAGNOSIS — IMO0001 Reserved for inherently not codable concepts without codable children: Secondary | ICD-10-CM | POA: Insufficient documentation

## 2012-04-23 ENCOUNTER — Ambulatory Visit: Payer: Self-pay | Admitting: Unknown Physician Specialty

## 2012-04-26 LAB — PATHOLOGY REPORT

## 2012-12-08 HISTORY — PX: COLONOSCOPY: SHX174

## 2013-01-15 ENCOUNTER — Emergency Department: Payer: Self-pay | Admitting: Emergency Medicine

## 2013-11-08 ENCOUNTER — Ambulatory Visit: Payer: Self-pay | Admitting: Obstetrics and Gynecology

## 2015-08-01 ENCOUNTER — Encounter: Payer: Self-pay | Admitting: Obstetrics and Gynecology

## 2015-11-21 ENCOUNTER — Emergency Department
Admission: EM | Admit: 2015-11-21 | Discharge: 2015-11-21 | Disposition: A | Discharge status: Home / Self Care | Payer: Medicare Other | Attending: Emergency Medicine | Admitting: Emergency Medicine

## 2015-11-21 ENCOUNTER — Encounter: Payer: Self-pay | Admitting: *Deleted

## 2015-11-21 DIAGNOSIS — I10 Essential (primary) hypertension: Secondary | ICD-10-CM | POA: Insufficient documentation

## 2015-11-21 DIAGNOSIS — E1140 Type 2 diabetes mellitus with diabetic neuropathy, unspecified: Secondary | ICD-10-CM | POA: Diagnosis not present

## 2015-11-21 DIAGNOSIS — R200 Anesthesia of skin: Secondary | ICD-10-CM | POA: Diagnosis present

## 2015-11-21 DIAGNOSIS — R0981 Nasal congestion: Secondary | ICD-10-CM | POA: Diagnosis not present

## 2015-11-21 DIAGNOSIS — B029 Zoster without complications: Secondary | ICD-10-CM | POA: Diagnosis not present

## 2015-11-21 DIAGNOSIS — R05 Cough: Secondary | ICD-10-CM | POA: Insufficient documentation

## 2015-11-21 DIAGNOSIS — Z79899 Other long term (current) drug therapy: Secondary | ICD-10-CM | POA: Diagnosis not present

## 2015-11-21 DIAGNOSIS — Z880 Allergy status to penicillin: Secondary | ICD-10-CM | POA: Insufficient documentation

## 2015-11-21 HISTORY — DX: Type 2 diabetes mellitus without complications: E11.9

## 2015-11-21 LAB — BASIC METABOLIC PANEL
Anion gap: 7 (ref 5–15)
BUN: 13 mg/dL (ref 6–20)
CO2: 25 mmol/L (ref 22–32)
Calcium: 8.8 mg/dL — ABNORMAL LOW (ref 8.9–10.3)
Chloride: 105 mmol/L (ref 101–111)
Creatinine, Ser: 0.57 mg/dL (ref 0.44–1.00)
GFR calc Af Amer: >60 mL/min (ref >60)
GFR calc non Af Amer: >60 mL/min (ref >60)
Glucose, Bld: 223 mg/dL — ABNORMAL HIGH (ref 65–99)
Potassium: 3.8 mmol/L (ref 3.5–5.1)
Sodium: 137 mmol/L (ref 135–145)

## 2015-11-21 LAB — CBC
HCT: 42.1 % (ref 35.0–47.0)
Hemoglobin: 14.5 g/dL (ref 12.0–16.0)
MCH: 27.9 pg (ref 26.0–34.0)
MCHC: 34.5 g/dL (ref 32.0–36.0)
MCV: 80.7 fL (ref 80.0–100.0)
Platelets: 230 10*3/uL (ref 150–440)
RBC: 5.22 MIL/uL — ABNORMAL HIGH (ref 3.80–5.20)
RDW: 13.5 % (ref 11.5–14.5)
WBC: 8.6 10*3/uL (ref 3.6–11.0)

## 2015-11-21 LAB — GLUCOSE, CAPILLARY: Glucose-Capillary: 214 mg/dL — ABNORMAL HIGH (ref 65–99)

## 2015-11-21 NOTE — ED Provider Notes (Signed)
[facility] Emergency Department Provider Note  ____________________________________________  Time seen: Approximately 5:33 PM  I have reviewed the triage vital signs and the nursing notes.   HISTORY  Chief Complaint Numbness and Nasal Congestion    HPI Laura [name] is a 64 y.o. female patient reports she was recently diagnosed with diabetes is on 3 medicines for it. Patient had a yeast infection for about a year and was not diagnosed then went to see a new doctor who diagnosed diabetes with blood sugars above 400. Patient comes in today for several reasons over one she's having stuffy nose and a cough productive of mostly clear mucus there is a little bit of green in it. Patient also reports her sugar was 81 this morning. Patient says her doctor wanted to have her sugar be around 100 she was little bit worried. Patient also reports her shingles is hurting her she has medicine for thorough and her hands and feet have been numb especially the soles of feet and toes. In going on for several weeks. I discussed with patient and her family that to make sure her sugar does not drop too much to just eat a few more bites of food this evening and then she is to see her doctor tomorrow anyway she can tell him about the extra food tonight and what happened with the sugar yesterday. I discussed asking her doctor to see if he wants to give her any steroids although it's been about a week since shingles involving going on so much sure that they would help much and might raise the blood sugar too much. I will let her doctor decided what if any antibiotic to give her for the cough. It sounds like it could be mostly viral. Numbness of the hands and feet sound like it's probably a diabetic neuropathy especially since the patient has apparently had diabetes for over a year at least. Touching by the yeast infection she described   Past Medical History  Diagnosis Date  . HTN (hypertension)    . Chronic cough 07/01/10    PFT  FEV1 2.20 (93%), FEV 1% 81, TLC 4,12 (81%), DLCO 78%, no BD. normal chest CT, sinus 07/08/10  . Anxiety   . Depression   . Chronic back pain   . GERD (gastroesophageal reflux disease)   . Diverticulosis   . Hemorrhoids   . Osteoarthritis   . Diabetes mellitus without complication [facility])     Patient Active Problem List   Diagnosis Date Noted  . GERD 09/19/2010  . OTHER DISEASES OF TRACHEA AND BRONCHUS 08/14/2010  . FATTY LIVER DISEASE 07/22/2010  . COUGH 06/04/2010  . ANXIETY DEPRESSION 05/18/2009  . HYPERTENSION, UNSPECIFIED 05/18/2009  . PALPITATIONS 05/18/2009    Past Surgical History  Procedure Laterality Date  . Partial hysterectomy    . Carpal tunnel release    . Foot surgery    . Cathater ablation      for arrhythmia  . Bronchoscopy  07/25/10    Current Outpatient Rx  Name  Route  Sig  Dispense  Refill  . albuterol (PROVENTIL) (5 MG/ML) 0.5% nebulizer solution   Nebulization   Take 2.5 mg by nebulization every 6 (six) hours as needed.           Marland Kitchen amLODipine (NORVASC) 5 MG tablet   Oral   Take 5 mg by mouth daily.           . citalopram (CELEXA) 40 MG tablet   Oral  Take 40 mg by mouth daily.           . clonazePAM (KLONOPIN) 1 MG tablet   Oral   Take 1 mg by mouth 2 (two) times daily.           Marland Kitchen dexlansoprazole (DEXILANT) 60 MG capsule   Oral   Take 60 mg by mouth daily.           . naproxen sodium (ANAPROX) 220 MG tablet   Oral   Take 440 mg by mouth daily as needed.           . Omeprazole (EQ OMEPRAZOLE) 20 MG TBEC   Oral   Take 1 tablet by mouth 2 (two) times daily.           . traMADol (ULTRAM) 50 MG tablet   Oral   Take 50 mg by mouth 3 (three) times daily as needed.             Allergies Atenolol; Clarithromycin; Doxycycline; Penicillins; and Sulfur  History reviewed. No pertinent family history.  Social History Social History  Substance Use Topics  . Smoking status: Never Smoker    . Smokeless tobacco: None  . Alcohol Use: No    Review of Systems Constitutional: No fever/chills Eyes: No visual changes. ENT: No sore throat. Cardiovascular: Denies chest pain. Respiratory: Denies shortness of breath. Gastrointestinal: No abdominal pain.  No nausea, no vomiting.  No diarrhea.  No constipation. Genitourinary: Negative for dysuria. Musculoskeletal: Negative for back pain. Skin: Negative for rash except the shingles except for pain in that side of the shingles Neurological: Negative for headaches, focal weakness   10-point ROS otherwise negative.  ____________________________________________   PHYSICAL EXAM:  VITAL SIGNS: ED Triage Vitals  Enc Vitals Group     BP 11/21/15 1644 154/70 mmHg     Pulse Rate 11/21/15 1644 88     Resp 11/21/15 1644 18     Temp 11/21/15 1644 98.2 F (36.8 C)     Temp Source 11/21/15 1644 Oral     SpO2 11/21/15 1644 97 %     Weight 11/21/15 1644 164 lb (74.39 kg)     Height 11/21/15 1644 5\' 5"  (1.651 m)     Head Cir --      Peak Flow --      Pain Score 11/21/15 1647 7     Pain Loc --      Pain Edu? --      Excl. in [place]? --     Constitutional: Alert and oriented. Well appearing and in no acute distress. Eyes: Conjunctivae are normal. PERRL. EOMI. Head: Atraumatic. Nose: No congestion/rhinnorhea. Mouth/Throat: Mucous membranes are moist.  Oropharynx non-erythematous. Neck: No stridor. Cardiovascular: Normal rate, regular rhythm. Grossly normal heart sounds.  Good peripheral circulation. Respiratory: Normal respiratory effort.  No retractions. Lungs CTAB. Gastrointestinal: Soft and nontender. No distention. No abdominal bruits. No CVA tenderness. Musculoskeletal: No lower extremity tenderness nor edema.  No joint effusions. Neurologic:  Normal speech and language. No gross focal neurologic deficits are appreciated except for complaints of numbness in the hands and feet especially the soles of the feet.. No gait  instability. Skin:  Skin is warm, dry and intact. No rash noted except for the low back area going around to the abdomen rash consistent with shingles Psychiatric: Mood and affect are normal. Speech and behavior are normal.  ____________________________________________   LABS (all labs ordered are listed, but only abnormal results are displayed)  Labs Reviewed  BASIC METABOLIC PANEL - Abnormal; Notable for the following:    Glucose, Bld 223 (*)    Calcium 8.8 (*)    All other components within normal limits  CBC - Abnormal; Notable for the following:    RBC 5.22 (*)    All other components within normal limits  GLUCOSE, CAPILLARY - Abnormal; Notable for the following:    Glucose-Capillary 214 (*)    All other components within normal limits  CBG MONITORING, ED   ____________________________________________  EKG   ____________________________________________  RADIOLOGY   ____________________________________________   PROCEDURES   ____________________________________________   INITIAL IMPRESSION / ASSESSMENT AND PLAN / ED COURSE  Pertinent labs & imaging results that were available during my care of the patient were reviewed by me and considered in my medical decision making (see chart for details).   ____________________________________________   FINAL CLINICAL IMPRESSION(S) / ED DIAGNOSES  Final diagnoses:  Type 2 diabetes mellitus with diabetic neuropathy, without long-term current use of insulin ([place])      Nena Polio, MD 11/21/15 2330

## 2015-11-21 NOTE — Discharge Instructions (Signed)
Blood Glucose Monitoring, Adult °Monitoring your blood glucose (also know as blood sugar) helps you to manage your diabetes. It also helps you and your health care provider monitor your diabetes and determine how well your treatment plan is working. °WHY SHOULD YOU MONITOR YOUR BLOOD GLUCOSE? °· It can help you understand how food, exercise, and medicine affect your blood glucose. °· It allows you to know what your blood glucose is at any given moment. You can quickly tell if you are having low blood glucose (hypoglycemia) or high blood glucose (hyperglycemia). °· It can help you and your health care provider know how to adjust your medicines. °· It can help you understand how to manage an illness or adjust medicine for exercise. °WHEN SHOULD YOU TEST? °Your health care provider will help you decide how often you should check your blood glucose. This may depend on the type of diabetes you have, your diabetes control, or the types of medicines you are taking. Be sure to write down all of your blood glucose readings so that this information can be reviewed with your health care provider. See below for examples of testing times that your health care provider may suggest. °Type 1 Diabetes °· Test at least 2 times per day if your diabetes is well controlled, if you are using an insulin pump, or if you perform multiple daily injections. °· If your diabetes is not well controlled or if you are sick, you may need to test more often. °· It is a good idea to also test: °¨ Before every insulin injection. °¨ Before and after exercise. °¨ Between meals and 2 hours after a meal. °¨ Occasionally between 2:00 a.m. and 3:00 a.m. °Type 2 Diabetes °· If you are taking insulin, test at least 2 times per day. However, it is best to test before every insulin injection. °· If you take medicines by mouth (orally), test 2 times a day. °· If you are on a controlled diet, test once a day. °· If your diabetes is not well controlled or if you  are sick, you may need to monitor more often. °HOW TO MONITOR YOUR BLOOD GLUCOSE °Supplies Needed °· Blood glucose meter. °· Test strips for your meter. Each meter has its own strips. You must use the strips that go with your own meter. °· A pricking needle (lancet). °· A device that holds the lancet (lancing device). °· A journal or log book to write down your results. °Procedure °· Wash your hands with soap and water. Alcohol is not preferred. °· Prick the side of your finger (not the tip) with the lancet. °· Gently milk the finger until a small drop of blood appears. °· Follow the instructions that come with your meter for inserting the test strip, applying blood to the strip, and using your blood glucose meter. °Other Areas to Get Blood for Testing °Some meters allow you to use other areas of your body (other than your finger) to test your blood. These areas are called alternative sites. The most common alternative sites are: °· The forearm. °· The thigh. °· The back area of the lower leg. °· The palm of the hand. °The blood flow in these areas is slower. Therefore, the blood glucose values you get may be delayed, and the numbers are different from what you would get from your fingers. Do not use alternative sites if you think you are having hypoglycemia. Your reading will not be accurate. Always use a finger if you are   having hypoglycemia. Also, if you cannot feel your lows (hypoglycemia unawareness), always use your fingers for your blood glucose checks. °ADDITIONAL TIPS FOR GLUCOSE MONITORING °· Do not reuse lancets. °· Always carry your supplies with you. °· All blood glucose meters have a 24-hour "hotline" number to call if you have questions or need help. °· Adjust (calibrate) your blood glucose meter with a control solution after finishing a few boxes of strips. °BLOOD GLUCOSE RECORD KEEPING °It is a good idea to keep a daily record or log of your blood glucose readings. Most glucose meters, if not all,  keep your glucose records stored in the meter. Some meters come with the ability to download your records to your home computer. Keeping a record of your blood glucose readings is especially helpful if you are wanting to look for patterns. Make notes to go along with the blood glucose readings because you might forget what happened at that exact time. Keeping good records helps you and your health care provider to work together to achieve good diabetes management.  °  °This information is not intended to replace advice given to you by your health care provider. Make sure you discuss any questions you have with your health care provider. °  °Document Released: 11/27/2003 Document Revised: 12/15/2014 Document Reviewed: 04/18/2013 °Elsevier Interactive Patient Education ©2016 [employer] ° °Diabetes and Exercise °Exercising regularly is important. It is not just about losing weight. It has many health benefits, such as: °· Improving your overall fitness, flexibility, and endurance. °· Increasing your bone density. °· Helping with weight control. °· Decreasing your body fat. °· Increasing your muscle strength. °· Reducing stress and tension. °· Improving your overall health. °People with diabetes who exercise gain additional benefits because exercise: °· Reduces appetite. °· Improves the body's use of blood sugar (glucose). °· Helps lower or control blood glucose. °· Decreases blood pressure. °· Helps control blood lipids (such as cholesterol and triglycerides). °· Improves the body's use of the hormone insulin by: °¨ Increasing the body's insulin sensitivity. °¨ Reducing the body's insulin needs. °· Decreases the risk for heart disease because exercising: °¨ Lowers cholesterol and triglycerides levels. °¨ Increases the levels of good cholesterol (such as high-density lipoproteins [HDL]) in the body. °¨ Lowers blood glucose levels. °YOUR ACTIVITY PLAN  °Choose an activity that you enjoy, and set realistic goals. To  exercise safely, you should begin practicing any new physical activity slowly, and gradually increase the intensity of the exercise over time. Your health care provider or diabetes educator can help create an activity plan that works for you. General recommendations include: °· Encouraging children to engage in at least 60 minutes of physical activity each day. °· Stretching and performing strength training exercises, such as yoga or weight lifting, at least 2 times per week. °· Performing a total of at least 150 minutes of moderate-intensity exercise each week, such as brisk walking or water aerobics. °· Exercising at least 3 days per week, making sure you allow no more than 2 consecutive days to pass without exercising. °· Avoiding long periods of inactivity (90 minutes or more). When you have to spend an extended period of time sitting down, take frequent breaks to walk or stretch. °RECOMMENDATIONS FOR EXERCISING WITH TYPE 1 OR TYPE 2 DIABETES  °· Check your blood glucose before exercising. If blood glucose levels are greater than 240 mg/dL, check for urine ketones. Do not exercise if ketones are present. °· Avoid injecting insulin into areas of the   body that are going to be exercised. For example, avoid injecting insulin into:  The arms when playing tennis.  The legs when jogging.  Keep a record of:  Food intake before and after you exercise.  Expected peak times of insulin action.  Blood glucose levels before and after you exercise.  The type and amount of exercise you have done.  Review your records with your health care provider. Your health care provider will help you to develop guidelines for adjusting food intake and insulin amounts before and after exercising.  If you take insulin or oral hypoglycemic agents, watch for signs and symptoms of hypoglycemia. They include:  Dizziness.  Shaking.  Sweating.  Chills.  Confusion.  Drink plenty of water while you exercise to prevent  dehydration or heat stroke. Body water is lost during exercise and must be replaced.  Talk to your health care provider before starting an exercise program to make sure it is safe for you. Remember, almost any type of activity is better than none.   This information is not intended to replace advice given to you by your health care provider. Make sure you discuss any questions you have with your health care provider.   Document Released: 02/14/2004 Document Revised: 04/10/2015 Document Reviewed: 05/03/2013 Elsevier Interactive Patient Education [employer].   Please discuss with your doctor any other treatments for your diabetic neuropathy. I would eat just a little bit of extra food tonight. She doctor know when you see him tomorrow and he may want to adjust your diabetic medicines so that she sugar stayed around 181 is not bad. I also discussed with you doctor whether or not he wants to treat you for the cough and runny nose you're having. Please do not hesitate to return for any further problems. Be sure to always carry some sugar or hard candy around with you in case you begin to develop the symptoms of hypoglycemia or low blood sugar that we discussed. Remember these are sweating nervousness and shaking. Remember to go over these things with your doctor as well.

## 2015-11-21 NOTE — ED Notes (Addendum)
Pt states she was diagnosed with DM 2 a few weeks ago, states she was diagnosed with shingles last week, states numbness in her feet and hands for several days, also states this AM her CBG was 81 and she is concerned, also states some nasal congestion

## 2015-12-09 HISTORY — PX: CATARACT EXTRACTION: SUR2

## 2016-01-31 DIAGNOSIS — E1140 Type 2 diabetes mellitus with diabetic neuropathy, unspecified: Secondary | ICD-10-CM | POA: Insufficient documentation

## 2016-02-05 DIAGNOSIS — G479 Sleep disorder, unspecified: Secondary | ICD-10-CM | POA: Insufficient documentation

## 2016-02-05 DIAGNOSIS — E1140 Type 2 diabetes mellitus with diabetic neuropathy, unspecified: Secondary | ICD-10-CM | POA: Insufficient documentation

## 2016-02-24 ENCOUNTER — Emergency Department
Admission: EM | Admit: 2016-02-24 | Discharge: 2016-02-24 | Disposition: A | Discharge status: Home / Self Care | Payer: Medicare Other | Attending: Emergency Medicine | Admitting: Emergency Medicine

## 2016-02-24 ENCOUNTER — Encounter: Payer: Self-pay | Admitting: Emergency Medicine

## 2016-02-24 DIAGNOSIS — F329 Major depressive disorder, single episode, unspecified: Secondary | ICD-10-CM | POA: Insufficient documentation

## 2016-02-24 DIAGNOSIS — E1140 Type 2 diabetes mellitus with diabetic neuropathy, unspecified: Secondary | ICD-10-CM | POA: Insufficient documentation

## 2016-02-24 DIAGNOSIS — Z90711 Acquired absence of uterus with remaining cervical stump: Secondary | ICD-10-CM | POA: Insufficient documentation

## 2016-02-24 DIAGNOSIS — G629 Polyneuropathy, unspecified: Secondary | ICD-10-CM

## 2016-02-24 DIAGNOSIS — E1165 Type 2 diabetes mellitus with hyperglycemia: Secondary | ICD-10-CM | POA: Diagnosis not present

## 2016-02-24 DIAGNOSIS — M1990 Unspecified osteoarthritis, unspecified site: Secondary | ICD-10-CM | POA: Insufficient documentation

## 2016-02-24 DIAGNOSIS — I10 Essential (primary) hypertension: Secondary | ICD-10-CM | POA: Diagnosis not present

## 2016-02-24 DIAGNOSIS — Z79899 Other long term (current) drug therapy: Secondary | ICD-10-CM | POA: Diagnosis not present

## 2016-02-24 MED ORDER — GABAPENTIN 300 MG PO CAPS: 300.0000 mg | ORAL_CAPSULE | Freq: Three times a day (TID) | ORAL | Status: DC

## 2016-02-24 MED ORDER — IBUPROFEN 600 MG PO TABS
600.0000 mg | ORAL_TABLET | Freq: Three times a day (TID) | ORAL | Status: DC
Start: 2016-02-24 — End: 2016-06-24

## 2016-02-24 MED ORDER — LORAZEPAM 1 MG PO TABS: 1.0000 mg | ORAL_TABLET | Freq: Every day | ORAL | Status: DC

## 2016-02-24 NOTE — ED Provider Notes (Signed)
Time Seen: Approximately.----------------------------------------- 3:56 PM on 02/24/2016 -----------------------------------------   I have reviewed the triage notes  Chief Complaint: Peripheral Neuropathy   History of Present Illness: Laura [name] is a 65 y.o. female *who presents with symptoms of bilateral hands and lower extremity pain. She has been to a neurologist and an orthopedic surgeon is been diagnosed with peripheral neuropathy with also upcoming referral to a pain specialist. And states her blood sugars have waxed and waned in her height. They seem to be somewhere between 142 210. She denies any fever or trauma. She has not had any nerve conduction studies at this time and is been diagnosed from a clinical standpoint. Today she states the pain is just did not "" kept her awake "" at night and she's tried Ultram without success. The patient's currently on gabapentin and that's 300 mg twice a day. She was also started recently on duloxetine.   Past Medical History  Diagnosis Date  . HTN (hypertension)   . Chronic cough 07/01/10    PFT  FEV1 2.20 (93%), FEV 1% 81, TLC 4,12 (81%), DLCO 78%, no BD. normal chest CT, sinus 07/08/10  . Anxiety   . Depression   . Chronic back pain   . GERD (gastroesophageal reflux disease)   . Diverticulosis   . Hemorrhoids   . Osteoarthritis   . Diabetes mellitus without complication [facility])     Patient Active Problem List   Diagnosis Date Noted  . GERD 09/19/2010  . OTHER DISEASES OF TRACHEA AND BRONCHUS 08/14/2010  . FATTY LIVER DISEASE 07/22/2010  . COUGH 06/04/2010  . ANXIETY DEPRESSION 05/18/2009  . HYPERTENSION, UNSPECIFIED 05/18/2009  . PALPITATIONS 05/18/2009    Past Surgical History  Procedure Laterality Date  . Partial hysterectomy    . Carpal tunnel release    . Foot surgery    . Cathater ablation      for arrhythmia  . Bronchoscopy  07/25/10    Past Surgical History  Procedure Laterality Date  . Partial hysterectomy     . Carpal tunnel release    . Foot surgery    . Cathater ablation      for arrhythmia  . Bronchoscopy  07/25/10    Current Outpatient Rx  Name  Route  Sig  Dispense  Refill  . albuterol (PROVENTIL) (5 MG/ML) 0.5% nebulizer solution   Nebulization   Take 2.5 mg by nebulization every 6 (six) hours as needed.           Marland Kitchen amLODipine (NORVASC) 5 MG tablet   Oral   Take 5 mg by mouth daily.           . citalopram (CELEXA) 40 MG tablet   Oral   Take 40 mg by mouth daily.           . clonazePAM (KLONOPIN) 1 MG tablet   Oral   Take 1 mg by mouth 2 (two) times daily.           Marland Kitchen dexlansoprazole (DEXILANT) 60 MG capsule   Oral   Take 60 mg by mouth daily.           . naproxen sodium (ANAPROX) 220 MG tablet   Oral   Take 440 mg by mouth daily as needed.           . Omeprazole (EQ OMEPRAZOLE) 20 MG TBEC   Oral   Take 1 tablet by mouth 2 (two) times daily.           Marland Kitchen  traMADol (ULTRAM) 50 MG tablet   Oral   Take 50 mg by mouth 3 (three) times daily as needed.             Allergies:  Atenolol; Clarithromycin; Doxycycline; Penicillins; and Sulfur  Family History: History reviewed. No pertinent family history.  Social History: Social History  Substance Use Topics  . Smoking status: Never Smoker   . Smokeless tobacco: None  . Alcohol Use: No     Review of Systems:   10 point review of systems was performed and was otherwise negative:  Constitutional: No fever Eyes: No visual disturbances ENT: No sore throat, ear pain Cardiac: No chest pain Respiratory: No shortness of breath, wheezing, or stridor Abdomen: No abdominal pain, no vomiting, No diarrhea Endocrine: No weight loss, No night sweats Extremities: No peripheral edema, cyanosis Skin: No rashes, easy bruising Neurologic: No focal weakness, trouble with speech or swollowing Urologic: No dysuria, Hematuria, or urinary frequency   Physical Exam:  ED Triage Vitals  Enc Vitals Group     BP  02/24/16 1513 166/71 mmHg     Pulse Rate 02/24/16 1513 93     Resp 02/24/16 1513 18     Temp 02/24/16 1513 98 F (36.7 C)     Temp Source 02/24/16 1513 Oral     SpO2 02/24/16 1513 98 %     Weight 02/24/16 1513 175 lb (79.379 kg)     Height 02/24/16 1513 5\' 5"  (1.651 m)     Head Cir --      Peak Flow --      Pain Score 02/24/16 1514 10     Pain Loc --      Pain Edu? --      Excl. in [place]? --     General: Awake , Alert , and Oriented times 3; GCS 15 Head: Normal cephalic , atraumatic Eyes: Pupils equal , round, reactive to light Nose/Throat: No nasal drainage, patent upper airway without erythema or exudate.  Neck: Supple, Full range of motion, No anterior adenopathy or palpable thyroid masses Lungs: Clear to ascultation without wheezes , rhonchi, or rales Heart: Regular rate, regular rhythm without murmurs , gallops , or rubs Abdomen: Soft, non tender without rebound, guarding , or rigidity; bowel sounds positive and symmetric in all 4 quadrants. No organomegaly .        Extremities: 2 plus symmetric pulses. No edema, clubbing or cyanosis Neurologic: normal ambulation, Motor symmetric without deficits, sensory intact Skin: warm, dry, no rashes  ED Course:  The patient clinically presents with pain that seems to be indicative of peripheral neuropathy given its location and the chronic nature of her discomfort. The patient can be increased on her Neurontin. And we discussed the lack of benefit with opioid therapy at this time. I will prescribe her some Ativan and prescription strength ibuprofen along with increasing her Neurontin. We did encourage her to follow up with the pain clinic.   Assessment:  Peripheral neuropathy      Plan: * Outpatient management Patient was advised to return immediately if condition worsens. Patient was advised to follow up with their primary care physician or other specialized physicians involved in their outpatient care. The patient and/or family  member/power of attorney had laboratory results reviewed at the bedside. All questions and concerns were addressed and appropriate discharge instructions were distributed by the nursing staff.             Daymon Larsen, MD 02/24/16 [phone]

## 2016-02-24 NOTE — ED Notes (Signed)
Pt present to ED with c/o with sharp pains with tingling that started Friday night. Pt states this has been happening since January ; has diabetic neuropathy.

## 2016-02-24 NOTE — Discharge Instructions (Signed)
Peripheral Neuropathy °Peripheral neuropathy is a type of nerve damage. It affects nerves that carry signals between the spinal cord and other parts of the body. These are called peripheral nerves. With peripheral neuropathy, one nerve or a group of nerves may be damaged.  °CAUSES  °Many things can damage peripheral nerves. For some people with peripheral neuropathy, the cause is unknown. Some causes include: °· Diabetes. This is the most common cause of peripheral neuropathy. °· Injury to a nerve. °· Pressure or stress on a nerve that lasts a long time. °· Too little vitamin B. Alcoholism can lead to this. °· Infections. °· Autoimmune diseases, such as multiple sclerosis and systemic lupus erythematosus. °· Inherited nerve diseases. °· Some medicines, such as cancer drugs. °· Toxic substances, such as lead and mercury. °· Too little blood flowing to the legs. °· Kidney disease. °· Thyroid disease. °SIGNS AND SYMPTOMS  °Different people have different symptoms. The symptoms you have will depend on which of your nerves is damaged.  Common symptoms include: °· Loss of feeling (numbness) in the feet and hands. °· Tingling in the feet and hands. °· Pain that burns. °· Very sensitive skin. °· Weakness. °· Not being able to move a part of the body (paralysis). °· Muscle twitching. °· Clumsiness or poor coordination. °· Loss of balance. °· Not being able to control your bladder. °· Feeling dizzy. °· Sexual problems. °DIAGNOSIS  °Peripheral neuropathy is a symptom, not a disease. Finding the cause of peripheral neuropathy can be hard. To figure that out, your health care provider will take a medical history and do a physical exam. A neurological exam will also be done. This involves checking things affected by your brain, spinal cord, and nerves (nervous system). For example, your health care provider will check your reflexes, how you move, and what you can feel.  °Other types of tests may also be ordered, such as: °· Blood  tests. °· A test of the fluid in your spinal cord. °· Imaging tests, such as CT scans or an MRI. °· Electromyography (EMG). This test checks the nerves that control muscles. °· Nerve conduction velocity tests. These tests check how fast messages pass through your nerves. °· Nerve biopsy. A small piece of nerve is removed. It is then checked under a microscope. °TREATMENT  °· Medicine is often used to treat peripheral neuropathy. Medicines may include: °¨ Pain-relieving medicines. Prescription or over-the-counter medicine may be suggested. °¨ Antiseizure medicine. This may be used for pain. °¨ Antidepressants. These also may help ease pain from neuropathy. °¨ Lidocaine. This is a numbing medicine. You might wear a patch or be given a shot. °¨ Mexiletine. This medicine is typically used to help control irregular heart rhythms. °· Surgery. Surgery may be needed to relieve pressure on a nerve or to destroy a nerve that is causing pain. °· Physical therapy to help movement. °· Assistive devices to help movement. °HOME CARE INSTRUCTIONS  °· Only take over-the-counter or prescription medicines as directed by your health care provider. Follow the instructions carefully for any given medicines. Do not take any other medicines without first getting approval from your health care provider. °· If you have diabetes, work closely with your health care provider to keep your blood sugar under control. °· If you have numbness in your feet: °¨ Check every day for signs of injury or infection. Watch for redness, warmth, and swelling. °¨ Wear padded socks and comfortable shoes. These help protect your feet. °· Do not do   things that put pressure on your damaged nerve.  Do not smoke. Smoking keeps blood from getting to damaged nerves.  Avoid or limit alcohol. Too much alcohol can cause a lack of B vitamins. These vitamins are needed for healthy nerves.  Develop a good support system. Coping with peripheral neuropathy can be  stressful. Talk to a mental health specialist or join a support group if you are struggling.  Follow up with your health care provider as directed. SEEK MEDICAL CARE IF:   You have new signs or symptoms of peripheral neuropathy.  You are struggling emotionally from dealing with peripheral neuropathy.  You have a fever. SEEK IMMEDIATE MEDICAL CARE IF:   You have an injury or infection that is not healing.  You feel very dizzy or begin vomiting.  You have chest pain.  You have trouble breathing.   This information is not intended to replace advice given to you by your health care provider. Make sure you discuss any questions you have with your health care provider.   Document Released: 11/14/2002 Document Revised: 08/06/2011 Document Reviewed: 08/01/2013 Elsevier Interactive Patient Education [employer].  Please return immediately if condition worsens. Please contact her primary physician or the physician you were given for referral. If you have any specialist physicians involved in her treatment and plan please also contact them. Thank you for using [facility].

## 2016-02-24 NOTE — ED Notes (Signed)
Discussed discharge instructions, prescriptions, and follow-up care with patient and pt's family, with pt's permission. No questions or concerns at this time. Pt stable at discharge.

## 2016-02-28 ENCOUNTER — Encounter: Payer: Self-pay | Admitting: Pain Medicine

## 2016-02-28 ENCOUNTER — Ambulatory Visit: Payer: Medicare Other | Attending: Pain Medicine | Admitting: Pain Medicine

## 2016-02-28 VITALS — BP 133/72 | HR 92 | Temp 98.1°F | Resp 16 | Ht 65.0 in | Wt 174.0 lb

## 2016-02-28 DIAGNOSIS — M1990 Unspecified osteoarthritis, unspecified site: Secondary | ICD-10-CM | POA: Insufficient documentation

## 2016-02-28 DIAGNOSIS — F418 Other specified anxiety disorders: Secondary | ICD-10-CM | POA: Diagnosis not present

## 2016-02-28 DIAGNOSIS — E1140 Type 2 diabetes mellitus with diabetic neuropathy, unspecified: Secondary | ICD-10-CM | POA: Insufficient documentation

## 2016-02-28 DIAGNOSIS — I10 Essential (primary) hypertension: Secondary | ICD-10-CM | POA: Insufficient documentation

## 2016-02-28 DIAGNOSIS — E0849 Diabetes mellitus due to underlying condition with other diabetic neurological complication: Secondary | ICD-10-CM

## 2016-02-28 DIAGNOSIS — E0841 Diabetes mellitus due to underlying condition with diabetic mononeuropathy: Secondary | ICD-10-CM

## 2016-02-28 DIAGNOSIS — K219 Gastro-esophageal reflux disease without esophagitis: Secondary | ICD-10-CM | POA: Insufficient documentation

## 2016-02-28 DIAGNOSIS — M79605 Pain in left leg: Secondary | ICD-10-CM | POA: Diagnosis present

## 2016-02-28 DIAGNOSIS — J45909 Unspecified asthma, uncomplicated: Secondary | ICD-10-CM | POA: Insufficient documentation

## 2016-02-28 DIAGNOSIS — T7840XA Allergy, unspecified, initial encounter: Secondary | ICD-10-CM | POA: Insufficient documentation

## 2016-02-28 DIAGNOSIS — M79604 Pain in right leg: Secondary | ICD-10-CM | POA: Diagnosis present

## 2016-02-28 NOTE — Patient Instructions (Addendum)
PLAN   Continue present medications Neurontin Cymbalta Klonopin Patient no longer taking tramadol tramadol. May consider amitriptyline or similar medications. Also discuss your taking ibuprofen with Dr. Kary Kos and with Dr. Lewis Shock or similar medications  Lumbar sympathetic block to be performed at time of return appointment  F/U PCP Dr. Kary Kos for evaliation of  BP and general medical  condition  F/U surgical evaluation. May consider pending follow-up evaluations  Ask the nurses and secretary how do you get your TENS unit and physical therapy  F/U neurological evaluation. May consider pending follow-up evaluations  May consider radiofrequency rhizolysis or intraspinal procedures pending response to present treatment and F/U evaluation   Patient to call Pain Management Center should patient have concerns prior to scheduled return appointment.  Lumbar Sympathetic Block Patient Information  Description: The lumbar plexus is a group of nerves that are part of the sympathetic nervous system.  These nerves supply organs in the pelvis and legs.  Lumbar sympathetic blocks are utilized for the diagnosis and treatment of painful conditions in these areas.   The lumbar plexus is located on both sides of the aorta at approximately the level of the second lumbar vertebral body.  The block will be performed with you lying on your abdomen with a pillow underneath.  Using direct x-ray guidance,   The plexus will be located on both sides of the spine.  Numbing medicine will be used to deaden the skin prior to needle insertion.  In most cases, a small amount of sedation can be give by IV prior to the numbing medicine.  One or two small needles will be placed near the plexus and local anesthetic will be injected.  This may make your leg(s) feel warm.  The Entire block usually lasts about 15-25 minutes.  Conditions which may be treated by lumbar sympathetic block:   Reflex sympathetic  dystrophy  Phantom limb pain  Peripheral neuropathy  Peripheral vascular disease ( inadequate blood flow )  Cancer pain of pelvis, leg and kidney  Preparation for the injection:  1. Do note eat any solid food or diary products within 8 hours of your appointment. 2. You may drink clear liquids up to 3 hours before appointment.  Clear liquids include water, black coffee, juice or soda.  No milk or cream please. 3. You may take your regular medication, including pain medications, with a sip of water before you appointment.  Diabetics should hold regular insulin ( if taken separately ) and take 1/2 NPH dose the morning of the procedure .  Carry some sugar containing items with you to your appointment. 4. A driver must accompany you and be prepared to drive you home after your procedure. 5. Bring all your current medication with you. 6. An IV may be inserted and sedation may be given at the discretion of the physician.  7. A blood pressure cuff, EKG and other monitors will often be applied during the procedure.  Some patients may need to have extra oxygen administered for a short period. 8. You will be asked to provide medical information, including your allergies and medications, prior to the procedure.  We must know immediately if your taking blood thinners (like Coumadin/Warfarin) or if you are allergic to IV iodine contrast (dye).  We must know if you could possibly be pregnant.  Possible side-effects   Bleeding from needle site or deeper  Infection (rare, can require surgery)  Nerve injury (rare)  Numbness & tingling (temporary)  Collapsed lung (  rare)  Spinal headache (a headache worse with upright posture)  Light-headedness (temporary)  Pain at injection site (several days)  Decreased blood pressure (temporary)  Weakness in legs (temporary)  Seizure or other drug reaction (rare)  Call if you experience:   Fever/chills associated with headache or increased back/ neck  pain  Headache worsened by an upright position  New onset weakness or numbness of an extremity below the injection site  Hives or difficulty breathing ( go to the emergency room)  Inflammation or drainage at the injections site(s)  New symptoms which are concerning to you  Please note:  If effective, we will often do a series of 2-3 injections spaced 3-6 weeks apart to maximally decrease your pain.  If initial series is effective, you may be a candidate for a more permanent block of the lumbar sympathetic plexus.  If you have any questions please call [phone] [facility]

## 2016-02-28 NOTE — Progress Notes (Signed)
Safety precautions to be maintained throughout the outpatient stay will include: orient to surroundings, keep bed in low position, maintain call bell within reach at all times, provide assistance with transfer out of bed and ambulation.  

## 2016-02-28 NOTE — Progress Notes (Signed)
Subjective:    Patient ID: Laura [name], female    DOB: 06-18-1951, 65 y.o.   MRN: [id number]  HPI  The patient is a 65 year old female who comes to pain management Center at the request of Dr. Gurney Maxin for further evaluation and treatment of pain involving the lower extremities especially with pain involving the upper extremities is well. The patient states that she was recently diagnosed with having diabetes mellitus. The patient states that the pain of the lower extremities is most severely incapacitating and interferes with activities of daily living as well as ability to obtain restful sleep. The patient stated that she was diagnosed in late October on November 2016. We discussed patient's condition including prior medications prescribe treating patient's condition. We informed patient that we would consider modifying medications and that we would recommend patient undergo lumbar sympathetic block in attempt to decrease severity of symptoms at this time. We will schedule patient for lumbar sympathetic block to be performed at time of return appointment. We also discussed patient's use of ibuprofen which has replace patient's use of Aleve. We have advised patient to follow-up with Dr. Rosario Jacks and with Dr. Melrose Nakayama to discuss the effects of ibuprofen on patient's liver and kidney as well as the stomach. We mention some of the side effects of these medications and explained to patient that she should exercise caution and try to minimize the use of the ibuprofen. The patient described her pain as aching burning constant distressing build exhausting painful getting longer horrible throbbing tearing uncomfortable sharp shooting pains interferes with patient ability to sleep as well as is awakened patient from sleep. The patient stated that the pain was present TIME. The patient was without any known factors a treatment which decreased her pain. We will schedule patient for lumbar synthetic block to be  performed at time return appointment and patient will continue her present medications Neurontin Cymbalta Klonopin and ibuprofen. The patient had previously been prescribed tramadol and is no longer taking tramadol. The patient was in agreement with suggested treatment plan      Review of Systems     Cardiovascular: Unremarkable   Pulmonary: Asthma   Neurological: Unremarkable  Psychological: Anxiety Depression  Gastrointestinal: Unremarkable  Genitourinary: Unremarkable  Hematologic: Unremarkable  Endocrine: Diabetes mellitus  Rheumatological: Unremarkable  Musculoskeletal: Unremarkable  Other significant: Unremarkable      Objective:   Physical Exam   There was tenderness to palpation of the splenius capitis and occipitalis musculature regions. Palpation over the cervical facet cervical paraspinal must reason was attends to palpation of mild degree as was noted in the splenius capitis and occipitalis musculature regions. Palpation of the acromioclavicular and glenohumeral joint region were attends to palpation of moderate degree. The patient appeared to be with slightly decreased grip strength and Tinel and Phalen's maneuver were without increase of pain of significant degree. There was no increased warmth and erythema in the region of the hands. Palpation over the region of the thoracic facet thoracic paraspinal musculature region was without evidence of crepitus of the thoracic region. Palpation over the lumbar paraspinal must reason lumbar facet region was attends to palpation of mild to moderate degree with lateral bending rotation extension and palpation of the lumbar facets reproducing mild to moderate discomfort. Palpation of the PSIS and PII S region was with mild discomfort. Palpation of the gluteal and piriformis musculature regions were with mild discomfort. Palpation of the greater trochanteric region and iliotibial band region was with mild to moderate  discomfort. Straight leg raise was tolerates approximately 30 without increased pain with dorsiflexion noted. There was questionably decreased sensation of the lower extremities in a stocking-type distribution. No new lesions of the lower extremities were noted. There was negative clonus negative Homans. EHL strength appeared to be decreased. No sensory deficit or dermatomal distribution detected. DTRs were difficult to elicit. The knees were attends to palpation with negative anterior and posterior drawer signs with no ballottement of the patella. Abdomen nontender and no costovertebral tenderness noted.     Assessment & Plan:     Diabetes mellitus with diabetic neuropathy  Osteoarthritis     PLAN   Continue present medications Neurontin Cymbalta Klonopin Patient no longer taking tramadol tramadol. May consider amitriptyline or similar medications. Also discuss your taking ibuprofen with Dr. Kary Kos and with Dr. Lewis Shock or similar medications  Lumbar sympathetic block to be performed at time of return appointment  F/U PCP Dr. Kary Kos for evaliation of  BP and general medical  condition  F/U surgical evaluation. May consider pending follow-up evaluations  Ask the nurses and secretary how do you get your TENS unit and physical therapy  F/U neurological evaluation. May consider pending follow-up evaluations  May consider radiofrequency rhizolysis or intraspinal procedures pending response to present treatment and F/U evaluation   Patient to call Pain Management Center should patient have concerns prior to scheduled return appointment.

## 2016-03-06 ENCOUNTER — Other Ambulatory Visit: Payer: Self-pay | Admitting: Pain Medicine

## 2016-03-12 ENCOUNTER — Encounter: Payer: Self-pay | Admitting: Pain Medicine

## 2016-03-12 ENCOUNTER — Ambulatory Visit: Payer: Medicare Other | Attending: Pain Medicine | Admitting: Pain Medicine

## 2016-03-12 VITALS — BP 159/83 | HR 80 | Temp 98.3°F | Resp 16 | Wt 183.0 lb

## 2016-03-12 DIAGNOSIS — E0849 Diabetes mellitus due to underlying condition with other diabetic neurological complication: Secondary | ICD-10-CM

## 2016-03-12 DIAGNOSIS — E1140 Type 2 diabetes mellitus with diabetic neuropathy, unspecified: Secondary | ICD-10-CM | POA: Insufficient documentation

## 2016-03-12 DIAGNOSIS — M79605 Pain in left leg: Secondary | ICD-10-CM | POA: Diagnosis present

## 2016-03-12 DIAGNOSIS — M79604 Pain in right leg: Secondary | ICD-10-CM | POA: Diagnosis present

## 2016-03-12 DIAGNOSIS — E0841 Diabetes mellitus due to underlying condition with diabetic mononeuropathy: Secondary | ICD-10-CM

## 2016-03-12 MED ORDER — FENTANYL CITRATE (PF) 100 MCG/2ML IJ SOLN
INTRAMUSCULAR | Status: AC
  Administered 2016-03-12: 100 ug via INTRAVENOUS
  Filled 2016-03-12: qty 2

## 2016-03-12 MED ORDER — MIDAZOLAM HCL 5 MG/5ML IJ SOLN
INTRAMUSCULAR | Status: AC
  Administered 2016-03-12: 3 mg via INTRAVENOUS
  Filled 2016-03-12: qty 5

## 2016-03-12 MED ORDER — ORPHENADRINE CITRATE 30 MG/ML IJ SOLN
INTRAMUSCULAR | Status: AC
  Administered 2016-03-12: 12:00:00
  Filled 2016-03-12: qty 2

## 2016-03-12 MED ORDER — TRIAMCINOLONE ACETONIDE 40 MG/ML IJ SUSP
INTRAMUSCULAR | Status: AC
  Administered 2016-03-12: 12:00:00
  Filled 2016-03-12: qty 1

## 2016-03-12 MED ORDER — BUPIVACAINE HCL (PF) 0.25 % IJ SOLN
INTRAMUSCULAR | Status: AC
  Administered 2016-03-12: 12:00:00
  Filled 2016-03-12: qty 30

## 2016-03-12 NOTE — Progress Notes (Signed)
Subjective:    Patient ID: Laura [name], female    DOB: 1951-03-03, 65 y.o.   MRN: [id number]  HPI  PROCEDURE PERFORMED: Lumbar sympathetic block.  HISTORY OF PRESENT ILLNESS: The patient is 65 y.o. female who returns to [facility] for further evaluation and treatment of pain involving the lower extremities. The patient is with history of Diabetes mellitus with diabetic neuropathy with pain described as sharp shooting stabbing burning sensation of the lower extremities especially . There is concern regarding the patient's pain being due to diabetic neuropathy . The risks, benefits, and expectations of the procedure were discussed and explained to the patient who was understanding and wished to proceed with interventional treatment as planned.   DESCRIPTION OF PROCEDURE: Lumbar sympathetic block with IV Versed, IV fentanyl conscious sedation, EKG, blood pressure, pulse, pulse oximetry monitoring. The procedure was performed with the patient in prone position under fluoroscopic guidance.   NEEDLE PLACEMENT AT L2, left side lumbar sympathetic block: With the patient in prone position and oblique orientation of 20 degrees, Betadine prep and local anesthetic skin wheal of 1.5% lidocaine plain was prepared at the proposed needle entry site. Under fluoroscopic guidance with 20 degrees oblique orientation, the 22 -gauge needle was inserted at the lateral border of the L2 vertebral body on the left side.   NEEDLE PLACEMENT AT L3, left side lumbar sympathetic block: With the patient in the prone position and oblique orientation of 20 degrees, Betadine prep and local anesthetic skin wheal of 1.5% lidocaine plain was prepared at the proposed needle entry site. Under fluoroscopic guidance with 20 degrees  oblique orientation, the 22 -gauge needle was inserted at the lateral border of the L3 vertebral body on the left side.   NEEDLE PLACEMENT AT L4, left side lumbar sympathetic block: With the  patient in prone position and oblique orientation of 20 degrees, Betadine prep and local anesthetic skin wheal of 1.5% lidocaine plain was prepared at the proposed needle entry site. Under fluoroscopic guidance with 20 degrees  oblique orientation, the 22 -gauge needle was inserted at the lateral border of the L4 vertebral body on the left side.    Following needle placement at the L2, L3 and L4 vertebral body levels on the left side, needle placement was then verified on lateral view with tip of the needle documented to be in the anterior third of the vertebral body of L2, L3 and L4 respectively. Following negative aspiration of each needle for heme and CSF, L2 vertebral body level needle was injected with 10 mL of 0.25% bupivacaine. L3 vertebral body level needle was injected with 10 mL of 0.25% bupivacaine. L4 vertebral body level was injected with 10 mL of 0.25% bupivacaine. Needles were removed. Please note temperature readings prior to lumbar sympathetic block were noted to be 80.9 degrees Fahrenheit and following completion of the lumbar sympathetic block temperature readings of the lower extremity were noted to be 85.5 degrees Fahrenheit. The patient tolerated the procedure well.  PLAN:   1. Medications: We will continue presently prescribed medications at this time. 2. The patient is to follow-up with primary care physician Dr. [place] Pink for further evaluation of blood pressure and general medical condition as discussed. 3. Surgical evaluation as discussed. We will avoid at this time 4. Neurological evaluation as discussed. May consider PNCV EMG studies 5. The patient may be candidate for radiofrequency procedures, implantation devices, and other treatment pending response to treatment and follow-up evaluation. 6. The patient has been advised  to adhere to proper body mechanics. 7. The patient has been advised to call the Pain Management Center prior to scheduled return appointment should  there be significant change in condition or have other concerns regarding condition prior to scheduled return appointment.   The patient was understanding and in agreement with suggested treatment plan. *  Review of Systems     Objective:   Physical Exam        Assessment & Plan:

## 2016-03-12 NOTE — Progress Notes (Signed)
Safety precautions to be maintained throughout the outpatient stay will include: orient to surroundings, keep bed in low position, maintain call bell within reach at all times, provide assistance with transfer out of bed and ambulation.  

## 2016-03-12 NOTE — Patient Instructions (Addendum)
PLAN   Continue present medications Neurontin Cymbalta Klonopin Patient no longer taking tramadol tramadol. May consider amitriptyline or similar medications. Also discuss your taking ibuprofen with Dr. Kary Kos and with Dr. Lewis Shock or similar medications as we previously discussed Please begin antibiotic today as prescribed  F/U PCP Dr. Kary Kos for evaliation of  BP and general medical  condition  F/U surgical evaluation. May consider pending follow-up evaluations  Ask the nurses and secretary how do you get your TENS unit and physical therapy  F/U neurological evaluation. May consider pending follow-up evaluations  May consider radiofrequency rhizolysis or intraspinal procedures pending response to present treatment and F/U evaluation   Patient to call Pain Management Center should patient have concerns prior to scheduled return appointment. Lumbar Sympathetic Block Patient Information  Description: The lumbar plexus is a group of nerves that are part of the sympathetic nervous system.  These nerves supply organs in the pelvis and legs.  Lumbar sympathetic blocks are utilized for the diagnosis and treatment of painful conditions in these areas.   The lumbar plexus is located on both sides of the aorta at approximately the level of the second lumbar vertebral body.  The block will be performed with you lying on your abdomen with a pillow underneath.  Using direct x-ray guidance,   The plexus will be located on both sides of the spine.  Numbing medicine will be used to deaden the skin prior to needle insertion.  In most cases, a small amount of sedation can be give by IV prior to the numbing medicine.  One or two small needles will be placed near the plexus and local anesthetic will be injected.  This may make your leg(s) feel warm.  The Entire block usually lasts about 15-25 minutes.  Conditions which may be treated by lumbar sympathetic block:   Reflex sympathetic dystrophy  Phantom  limb pain  Peripheral neuropathy  Peripheral vascular disease ( inadequate blood flow )  Cancer pain of pelvis, leg and kidney  Preparation for the injection:  1. Do note eat any solid food or diary products within 8 hours of your appointment. 2. You may drink clear liquids up to 3 hours before appointment.  Clear liquids include water, black coffee, juice or soda.  No milk or cream please. 3. You may take your regular medication, including pain medications, with a sip of water before you appointment.  Diabetics should hold regular insulin ( if taken separately ) and take 1/2 NPH dose the morning of the procedure .  Carry some sugar containing items with you to your appointment. 4. A driver must accompany you and be prepared to drive you home after your procedure. 5. Bring all your current medication with you. 6. An IV may be inserted and sedation may be given at the discretion of the physician.  7. A blood pressure cuff, EKG and other monitors will often be applied during the procedure.  Some patients may need to have extra oxygen administered for a short period. 8. You will be asked to provide medical information, including your allergies and medications, prior to the procedure.  We must know immediately if your taking blood thinners (like Coumadin/Warfarin) or if you are allergic to IV iodine contrast (dye).  We must know if you could possibly be pregnant.  Possible side-effects   Bleeding from needle site or deeper  Infection (rare, can require surgery)  Nerve injury (rare)  Numbness & tingling (temporary)  Collapsed lung (rare)  Spinal  headache (a headache worse with upright posture)  Light-headedness (temporary)  Pain at injection site (several days)  Decreased blood pressure (temporary)  Weakness in legs (temporary)  Seizure or other drug reaction (rare)  Call if you experience:   Fever/chills associated with headache or increased back/ neck pain  Headache  worsened by an upright position  New onset weakness or numbness of an extremity below the injection site  Hives or difficulty breathing ( go to the emergency room)  Inflammation or drainage at the injections site(s)  New symptoms which are concerning to you  Please note:  If effective, we will often do a series of 2-3 injections spaced 3-6 weeks apart to maximally decrease your pain.  If initial series is effective, you may be a candidate for a more permanent block of the lumbar sympathetic plexus.  If you have any questions please call [phone] [facility] Pain Management Discharge Instructions  General Discharge Instructions :  If you need to reach your doctor call: Monday-Friday 8:00 am - 4:00 pm at [phone] or toll free [phone].  After clinic hours [phone] to have operator reach doctor.  Bring all of your medication bottles to all your appointments in the pain clinic.  To cancel or reschedule your appointment with Pain Management please remember to call 24 hours in advance to avoid a fee.  Refer to the educational materials which you have been given on: General Risks, I had my Procedure. Discharge Instructions, Post Sedation.  Post Procedure Instructions:  The drugs you were given will stay in your system until tomorrow, so for the next 24 hours you should not drive, make any legal decisions or drink any alcoholic beverages.  You may eat anything you prefer, but it is better to start with liquids then soups and crackers, and gradually work up to solid foods.  Please notify your doctor immediately if you have any unusual bleeding, trouble breathing or pain that is not related to your normal pain.  Depending on the type of procedure that was done, some parts of your body may feel week and/or numb.  This usually clears up by tonight or the next day.  Walk with the use of an assistive device or accompanied by an adult for the  24 hours.  You may use ice on the affected area for the first 24 hours.  Put ice in a Ziploc bag and cover with a towel and place against area 15 minutes on 15 minutes off.  You may switch to heat after 24 hours.

## 2016-03-13 ENCOUNTER — Other Ambulatory Visit: Payer: Self-pay | Admitting: Pain Medicine

## 2016-03-13 ENCOUNTER — Telehealth: Payer: Self-pay | Admitting: *Deleted

## 2016-03-13 MED ORDER — CELECOXIB 200 MG PO CAPS: ORAL_CAPSULE | ORAL | Status: DC

## 2016-03-13 NOTE — Telephone Encounter (Signed)
No problems post procedure. 

## 2016-03-17 ENCOUNTER — Telehealth: Payer: Self-pay | Admitting: *Deleted

## 2016-03-17 ENCOUNTER — Telehealth: Payer: Self-pay | Admitting: Pain Medicine

## 2016-03-17 NOTE — Telephone Encounter (Signed)
Thank you very much 

## 2016-03-17 NOTE — Telephone Encounter (Signed)
Patient states she is unable to get Celebrex without prior authorization, wants to know what other meds are available and will need something done within next 2-3 days as she will be out of ibuprofen.

## 2016-03-17 NOTE — Telephone Encounter (Signed)
Nurses and Secretaries, I did not recommend Celebrex for the patient The patient brought in a note stating that her primary care physician recommended that she begin Celebrex Therefore I wrote the prescription for Celebrex for the patient The patient should return to her primary care physician and see what he wants her to take since [name] Laura [name] was present when patient came to the clinic requesting a prescription for Celebrex

## 2016-03-17 NOTE — Telephone Encounter (Signed)
Patient called and given the insurance preferred alternatives to Celebrex. Patient to check with Dr. Rosario Jacks and find out which one he prefers. Fax also sent to Dr. Rosario Jacks with approved medications.

## 2016-03-25 ENCOUNTER — Ambulatory Visit: Payer: Medicare Other | Attending: Pain Medicine | Admitting: Physical Therapy

## 2016-03-25 DIAGNOSIS — M79671 Pain in right foot: Secondary | ICD-10-CM | POA: Diagnosis present

## 2016-03-25 DIAGNOSIS — M79672 Pain in left foot: Secondary | ICD-10-CM | POA: Insufficient documentation

## 2016-03-25 NOTE — Therapy (Signed)
[place] MAIN [facility] SERVICES [address] [place], [place], [address] Phone: [phone]   Fax:  [phone]  Physical Therapy Evaluation  Patient Details  Name: Laura [name] MRN: [id number] Date of Birth: 03/07/1951 No Data Recorded  Encounter Date: 03/25/2016      PT End of Session - 03/25/16 1631    Visit Number 1   PT Start Time 0400   PT Stop Time 0445   PT Time Calculation (min) 45 min      Past Medical History  Diagnosis Date  . HTN (hypertension)   . Chronic cough 07/01/10    PFT  FEV1 2.20 (93%), FEV 1% 81, TLC 4,12 (81%), DLCO 78%, no BD. normal chest CT, sinus 07/08/10  . Anxiety   . Depression   . Chronic back pain   . GERD (gastroesophageal reflux disease)   . Diverticulosis   . Hemorrhoids   . Osteoarthritis   . Diabetes mellitus without complication ([place])   . Allergy   . Diabetic neuropathy [facility])     Past Surgical History  Procedure Laterality Date  . Partial hysterectomy    . Carpal tunnel release    . Foot surgery    . Cathater ablation      for arrhythmia  . Bronchoscopy  07/25/10  . Cataract extraction Bilateral Jan 2017    There were no vitals filed for this visit.                Tens Evaluation [facility]) - 03/25/16 1613    TENS History   History of Current Condition Patient was Dx with diabetic neuropathy 12/16 and is having  pain  in feet and numbness  and tingling in her hands.   Treatments used to date Medication;Injections   ROM Impairments and Functional Limitations WFL   ROM WFL   Strength Impairments and Functional Limitations WFL   Strength WFL BLE   Sensation Impairments and Fuctional Limitations numbness BUE hands and feet   Sensation decreased sensation light touch in hands and feet   Assessment and Plan   Assessment/ Plan Patient is able to demonstrate proper setup and use of TENS unit, no further skilled needs.   Initial Parameters used constant    Type/Brand of TENS  issued: medical modalities   Pre-programmed setting: constant mode   Frequency and duraton of daily use: 2 hours on/ 2 hours off                      PT Education - 03/25/16 1630    Education provided Yes   Education Details TENS   Person(s) Educated Patient   Methods Explanation   Comprehension Verbalized understanding             PT Long Term Goals - 03/25/16 1632    PT LONG TERM GOAL #1   Title Patient will understand use of TENS unti with contraindications.    Status Achieved               Plan - 03/25/16 1631    Clinical Impression Statement Patient was evaluated for TENS unit for BLE foot pain.    Rehab Potential Fair   PT Frequency One time visit   PT Treatment/Interventions Other (comment)  TENs unit       Patient will benefit from skilled therapeutic intervention in order to improve the following deficits and impairments:  Pain  Visit Diagnosis: Pain in left foot  Pain in right  foot     Problem List Patient Active Problem List   Diagnosis Date Noted  . Allergic state 02/28/2016  . Arthritis 02/28/2016  . Acid reflux 02/28/2016  . BP (high blood pressure) 02/28/2016  . Chronic painful diabetic neuropathy ([place]) 02/05/2016  . Difficulty sleeping 02/05/2016  . Diabetic neuropathy ([place]) 01/31/2016  . Reflux 11/19/2011  . GERD 09/19/2010  . OTHER DISEASES OF TRACHEA AND BRONCHUS 08/14/2010  . FATTY LIVER DISEASE 07/22/2010  . Cough 06/04/2010  . ANXIETY DEPRESSION 05/18/2009  . HYPERTENSION, UNSPECIFIED 05/18/2009  . PALPITATIONS 05/18/2009   Alanson Puls, PT, DPT  [place], Minette Headland S 03/25/2016, 5:07 PM  [place] MAIN [facility] SERVICES [address] [place], [place], [address] Phone: [phone]   Fax:  [phone]  Name: Emira Wigley MRN: [id number] Date of Birth: [birth year]

## 2016-04-03 ENCOUNTER — Encounter: Payer: Self-pay | Admitting: Pain Medicine

## 2016-04-03 ENCOUNTER — Ambulatory Visit: Payer: Medicare Other | Attending: Pain Medicine | Admitting: Pain Medicine

## 2016-04-03 VITALS — BP 154/66 | HR 81 | Temp 98.2°F | Resp 16 | Ht 64.0 in | Wt 183.0 lb

## 2016-04-03 DIAGNOSIS — E0841 Diabetes mellitus due to underlying condition with diabetic mononeuropathy: Secondary | ICD-10-CM

## 2016-04-03 DIAGNOSIS — E1140 Type 2 diabetes mellitus with diabetic neuropathy, unspecified: Secondary | ICD-10-CM | POA: Insufficient documentation

## 2016-04-03 DIAGNOSIS — M1990 Unspecified osteoarthritis, unspecified site: Secondary | ICD-10-CM | POA: Diagnosis not present

## 2016-04-03 DIAGNOSIS — E0849 Diabetes mellitus due to underlying condition with other diabetic neurological complication: Secondary | ICD-10-CM

## 2016-04-03 DIAGNOSIS — Z79899 Other long term (current) drug therapy: Secondary | ICD-10-CM | POA: Diagnosis not present

## 2016-04-03 DIAGNOSIS — M79604 Pain in right leg: Secondary | ICD-10-CM | POA: Diagnosis present

## 2016-04-03 DIAGNOSIS — M79605 Pain in left leg: Secondary | ICD-10-CM | POA: Diagnosis present

## 2016-04-03 NOTE — Patient Instructions (Addendum)
PLAN   Continue present medications Neurontin Cymbalta Klonopin patient advised to avoid Naprosyn and nonsteroidal anti-inflammatory medications on today's visit. May consider amitriptyline and other medications as discussed  Lumbar sympathetic block to be performed at time return appointment  F/U PCP Dr. Kary Kos for evaliation of  BP and general medical  condition  F/U surgical evaluation. May consider pending follow-up evaluations  Ask the nurses and secretary how do you get your TENS unit and physical therapy  F/U neurological evaluation. May consider PNCV/EMG studies and other studies pending follow-up evaluations  May consider radiofrequency rhizolysis or intraspinal procedures pending response to present treatment and F/U evaluation   Patient to call Pain Management Center should patient have concerns prior to scheduled return appointment.  Lumbar Sympathetic Block Patient Information  Description: The lumbar plexus is a group of nerves that are part of the sympathetic nervous system.  These nerves supply organs in the pelvis and legs.  Lumbar sympathetic blocks are utilized for the diagnosis and treatment of painful conditions in these areas.   The lumbar plexus is located on both sides of the aorta at approximately the level of the second lumbar vertebral body.  The block will be performed with you lying on your abdomen with a pillow underneath.  Using direct x-ray guidance,   The plexus will be located on both sides of the spine.  Numbing medicine will be used to deaden the skin prior to needle insertion.  In most cases, a small amount of sedation can be give by IV prior to the numbing medicine.  One or two small needles will be placed near the plexus and local anesthetic will be injected.  This may make your leg(s) feel warm.  The Entire block usually lasts about 15-25 minutes.  Conditions which may be treated by lumbar sympathetic block:   Reflex sympathetic dystrophy  Phantom  limb pain  Peripheral neuropathy  Peripheral vascular disease ( inadequate blood flow )  Cancer pain of pelvis, leg and kidney  Preparation for the injection:  1. Do note eat any solid food or diary products within 8 hours of your appointment. 2. You may drink clear liquids up to 3 hours before appointment.  Clear liquids include water, black coffee, juice or soda.  No milk or cream please. 3. You may take your regular medication, including pain medications, with a sip of water before you appointment.  Diabetics should hold regular insulin ( if taken separately ) and take 1/2 NPH dose the morning of the procedure .  Carry some sugar containing items with you to your appointment. 4. A driver must accompany you and be prepared to drive you home after your procedure. 5. Bring all your current medication with you. 6. An IV may be inserted and sedation may be given at the discretion of the physician.  7. A blood pressure cuff, EKG and other monitors will often be applied during the procedure.  Some patients may need to have extra oxygen administered for a short period. 8. You will be asked to provide medical information, including your allergies and medications, prior to the procedure.  We must know immediately if your taking blood thinners (like Coumadin/Warfarin) or if you are allergic to IV iodine contrast (dye).  We must know if you could possibly be pregnant.  Possible side-effects   Bleeding from needle site or deeper  Infection (rare, can require surgery)  Nerve injury (rare)  Numbness & tingling (temporary)  Collapsed lung (rare)  Spinal headache (a  headache worse with upright posture)  Light-headedness (temporary)  Pain at injection site (several days)  Decreased blood pressure (temporary)  Weakness in legs (temporary)  Seizure or other drug reaction (rare)  Call if you experience:   Fever/chills associated with headache or increased back/ neck pain  Headache  worsened by an upright position  New onset weakness or numbness of an extremity below the injection site  Hives or difficulty breathing ( go to the emergency room)  Inflammation or drainage at the injections site(s)  New symptoms which are concerning to you  Please note:  If effective, we will often do a series of 2-3 injections spaced 3-6 weeks apart to maximally decrease your pain.  If initial series is effective, you may be a candidate for a more permanent block of the lumbar sympathetic plexus.  If you have any questions please call [phone] [facility]

## 2016-04-03 NOTE — Progress Notes (Signed)
Safety precautions to be maintained throughout the outpatient stay will include: orient to surroundings, keep bed in low position, maintain call bell within reach at all times, provide assistance with transfer out of bed and ambulation.  

## 2016-04-03 NOTE — Progress Notes (Signed)
Subjective:    Patient ID: Laura [name], female    DOB: [birth year], 65 y.o.   MRN: [id number]  HPI  The patient is a 65 year old female who returns to pain management for further evaluation and treatment of pain involving the lower extremities. The patient is a burning stinging sensation of the lower extremities which has been persistent despite prior treatment. The patient is undergone lumbar sympathetic block with some improvement of her pain and wishes to undergo lumbar sympathetic block of the other lower extremity. We discussed patient's medications as well on today's visit and patient states that she has had some improvement of pain with Neurontin 300 mg which she had been told to take and the dose of 3 pills 3 times per day per Dr. Melrose Nakayama. The patient also has been taking Cymbalta as well as Naprosyn. We have advised patient to avoid the use of Naprosyn and to continue her Neurontin and Cymbalta as per Dr. Melrose Nakayama. Because of the office of Dr. Melrose Nakayama on today's visit regarding the Neurontin and we will request permission to prescribe medications for treatment of patient's pain. We will proceed with lumbar sympathetic block at time return appointment and consider patient for additional modifications of treatment pending sponsor treatment and follow-up evaluation. We will proceed with lumbar synthetic block as discussed at time return appointment and consider additional modifications of treatment pending follow-up evaluation  Review of Systems     Objective:   Physical Exam  There was tends to palpation of paraspinal muscular treat the cervical region cervical facet region a mild degree with mild tenderness over the splenius capitis and occipitalis musculature regions. Palpation over the region of the thoracic facet thoracic paraspinal musculature region was without excessive tends to palpation and no crepitus of the thoracic region was noted. Palpation of the acromioclavicular and glenohumeral  joint regions reproduce mild discomfort and patient appeared to be with unremarkable Spurling's maneuver. The patient appeared to be with bilaterally equal grip strength with Tinel and Phalen's maneuver reproducing minimal discomfort as well palpation over the lumbar paraspinal muscular treat and lumbar facet region was attends to palpation of moderate degree with lateral bending rotation extension and palpation of lumbar facets reproducing moderate discomfort. There was decreased sensation of the lower extremities in a stocking-type distribution with areas of increased sensitivity to touch of the lower extremities. Palpation of the knees reveal patient to be with tenderness to palpation of moderate degree with crepitus of the knees with negative anterior and posterior drawer signs without ballottement of the patella. EHL strength the lower extremities were noted. DTRs were difficult to this patient had difficulty relaxing. No definite sensory deficit or dermatomal distribution the lower extremities noted abdomen nontender with no costovertebral tenderness noted there was negative clonus negative Homans      Assessment & Plan:    Diabetes mellitus with diabetic neuropathy  Osteoarthritis    PLAN   Continue present medications NeurontinAnd Cymbalta  The patient was advised to avoid Naprosyn and nonsteroidal anti-inflammatory medications on today's visit. May consider amitriptyline and other medications as discussed  Lumbar sympathetic block to be performed at time return appointment  F/U PCP Dr. Kary Kos for evaliation of  BP and general medical  condition  F/U surgical evaluation. May consider pending follow-up evaluations  Ask the nurses and secretary how do you get your TENS unit and physical therapy  F/U neurological evaluation.. Patient will follow-up with Dr. Melrose Nakayama as discussed. Patient will continue Neurontin 300 mg and will take 3  pills 3 times per day as per Dr. Melrose Nakayama  May  consider radiofrequency rhizolysis or intraspinal procedures pending response to present treatment and F/U evaluation   Patient to call Pain Management Center should patient have concerns prior to scheduled return appointment.

## 2016-04-10 ENCOUNTER — Telehealth: Payer: Self-pay | Admitting: *Deleted

## 2016-04-10 NOTE — Telephone Encounter (Signed)
Pt daughter called in for her mother, due to her mother is working at the moment wanting to speak with Dr. Primus Bravo. i made the daughter aware that Dr. Primus Bravo is in clinicals and I would have to take a message. She stated to have Dr. Primus Bravo to give her mom a call ASAP.

## 2016-04-10 NOTE — Telephone Encounter (Signed)
Thank you :)

## 2016-04-10 NOTE — Telephone Encounter (Signed)
Laura [name] Please call patient this morning to collect details of what concerns patient and discussed with me as soon as possible so that we can provide a plan of treatment for the patient

## 2016-04-10 NOTE — Telephone Encounter (Signed)
Nurses Please let Laura [name] know so that she can address this and hopefully prevent this from reoccurring

## 2016-04-10 NOTE — Telephone Encounter (Signed)
Patient states that someone called from our office and rescheduled her appointment via her supervisor at work. Asked that we not leave messages anymore , just to leave a message and she will call us back. I assured her that it would not happen again and apologized. Confirmed appointment for May 12th.

## 2016-04-14 ENCOUNTER — Ambulatory Visit: Payer: Medicare Other | Admitting: Pain Medicine

## 2016-04-18 ENCOUNTER — Encounter: Payer: Self-pay | Admitting: Pain Medicine

## 2016-04-18 ENCOUNTER — Ambulatory Visit: Payer: Medicare Other | Attending: Pain Medicine | Admitting: Pain Medicine

## 2016-04-18 VITALS — BP 136/76 | HR 84 | Temp 89.6°F | Resp 16 | Ht 64.0 in | Wt 185.0 lb

## 2016-04-18 DIAGNOSIS — E0849 Diabetes mellitus due to underlying condition with other diabetic neurological complication: Secondary | ICD-10-CM

## 2016-04-18 DIAGNOSIS — G629 Polyneuropathy, unspecified: Secondary | ICD-10-CM | POA: Insufficient documentation

## 2016-04-18 DIAGNOSIS — M79604 Pain in right leg: Secondary | ICD-10-CM | POA: Insufficient documentation

## 2016-04-18 DIAGNOSIS — M79605 Pain in left leg: Secondary | ICD-10-CM | POA: Insufficient documentation

## 2016-04-18 DIAGNOSIS — E0841 Diabetes mellitus due to underlying condition with diabetic mononeuropathy: Secondary | ICD-10-CM

## 2016-04-18 MED ORDER — TRIAMCINOLONE ACETONIDE 40 MG/ML IJ SUSP
INTRAMUSCULAR | Status: AC
  Filled 2016-04-18: qty 1

## 2016-04-18 MED ORDER — MIDAZOLAM HCL 5 MG/5ML IJ SOLN
INTRAMUSCULAR | Status: AC
  Administered 2016-04-18: 2 mg via INTRAVENOUS
  Filled 2016-04-18: qty 5

## 2016-04-18 MED ORDER — LIDOCAINE HCL (PF) 1 % IJ SOLN: 10.0000 mL | Freq: Once | INTRAMUSCULAR | Status: DC

## 2016-04-18 MED ORDER — ORPHENADRINE CITRATE 30 MG/ML IJ SOLN: 60.0000 mg | Freq: Once | INTRAMUSCULAR | Status: DC

## 2016-04-18 MED ORDER — AZITHROMYCIN 250 MG PO TABS: ORAL_TABLET | ORAL | Status: DC

## 2016-04-18 MED ORDER — ORPHENADRINE CITRATE 30 MG/ML IJ SOLN
INTRAMUSCULAR | Status: AC
  Administered 2016-04-18: 09:00:00
  Filled 2016-04-18: qty 2

## 2016-04-18 MED ORDER — LACTATED RINGERS IV SOLN: 1000.0000 mL | INTRAVENOUS | Status: DC

## 2016-04-18 MED ORDER — FENTANYL CITRATE (PF) 100 MCG/2ML IJ SOLN
INTRAMUSCULAR | Status: AC
  Administered 2016-04-18: 50 ug via INTRAVENOUS
  Filled 2016-04-18: qty 2

## 2016-04-18 MED ORDER — LIDOCAINE HCL (PF) 1 % IJ SOLN
INTRAMUSCULAR | Status: AC
  Administered 2016-04-18: 09:00:00
  Filled 2016-04-18: qty 5

## 2016-04-18 MED ORDER — MIDAZOLAM HCL 5 MG/5ML IJ SOLN: 5.0000 mg | Freq: Once | INTRAMUSCULAR | Status: DC

## 2016-04-18 MED ORDER — BUPIVACAINE HCL (PF) 0.25 % IJ SOLN: 30.0000 mL | Freq: Once | INTRAMUSCULAR | Status: DC

## 2016-04-18 MED ORDER — BUPIVACAINE HCL (PF) 0.25 % IJ SOLN
INTRAMUSCULAR | Status: AC
  Administered 2016-04-18: 09:00:00
  Filled 2016-04-18: qty 30

## 2016-04-18 MED ORDER — FENTANYL CITRATE (PF) 100 MCG/2ML IJ SOLN: 100.0000 ug | Freq: Once | INTRAMUSCULAR | Status: DC

## 2016-04-18 NOTE — Patient Instructions (Addendum)
PLAN   Continue present medications Neurontin Cymbalta Klonopin Patient no longer taking tramadol tramadol. May consider amitriptyline or similar medications. Also discuss your taking ibuprofen with Dr. Kary Kos and with Dr. Lewis Shock or similar medications as we previously discussed Please begin antibiotic azithromycin(Z-Pak)  today as prescribed  F/U PCP Dr. Kary Kos for evaliation of  BP and general medical  condition  F/U surgical evaluation. May consider pending follow-up evaluations  Ask the nurses and secretary how do you get your TENS unit and physical therapy  F/U neurological evaluation. May consider pending follow-up evaluations  May consider radiofrequency rhizolysis or intraspinal procedures pending response to present treatment and F/U evaluation   Patient to call Pain Management Center should patient have concerns prior to scheduled return appointment.Lumbar Sympathetic Block Patient Information  Description: The lumbar plexus is a group of nerves that are part of the sympathetic nervous system.  These nerves supply organs in the pelvis and legs.  Lumbar sympathetic blocks are utilized for the diagnosis and treatment of painful conditions in these areas.   The lumbar plexus is located on both sides of the aorta at approximately the level of the second lumbar vertebral body.  The block will be performed with you lying on your abdomen with a pillow underneath.  Using direct x-ray guidance,   The plexus will be located on both sides of the spine.  Numbing medicine will be used to deaden the skin prior to needle insertion.  In most cases, a small amount of sedation can be give by IV prior to the numbing medicine.  One or two small needles will be placed near the plexus and local anesthetic will be injected.  This may make your leg(s) feel warm.  The Entire block usually lasts about 15-25 minutes.  Conditions which may be treated by lumbar sympathetic block:   Reflex sympathetic  dystrophy  Phantom limb pain  Peripheral neuropathy  Peripheral vascular disease ( inadequate blood flow )  Cancer pain of pelvis, leg and kidney  Preparation for the injection:  1. Do note eat any solid food or diary products within 8 hours of your appointment. 2. You may drink clear liquids up to 3 hours before appointment.  Clear liquids include water, black coffee, juice or soda.  No milk or cream please. 3. You may take your regular medication, including pain medications, with a sip of water before you appointment.  Diabetics should hold regular insulin ( if taken separately ) and take 1/2 NPH dose the morning of the procedure .  Carry some sugar containing items with you to your appointment. 4. A driver must accompany you and be prepared to drive you home after your procedure. 5. Bring all your current medication with you. 6. An IV may be inserted and sedation may be given at the discretion of the physician.  7. A blood pressure cuff, EKG and other monitors will often be applied during the procedure.  Some patients may need to have extra oxygen administered for a short period. 8. You will be asked to provide medical information, including your allergies and medications, prior to the procedure.  We must know immediately if your taking blood thinners (like Coumadin/Warfarin) or if you are allergic to IV iodine contrast (dye).  We must know if you could possibly be pregnant.  Possible side-effects   Bleeding from needle site or deeper  Infection (rare, can require surgery)  Nerve injury (rare)  Numbness & tingling (temporary)  Collapsed lung (rare)  Spinal headache (a headache worse with upright posture)  Light-headedness (temporary)  Pain at injection site (several days)  Decreased blood pressure (temporary)  Weakness in legs (temporary)  Seizure or other drug reaction (rare)  Call if you experience:   Fever/chills associated with headache or increased back/ neck  pain  Headache worsened by an upright position  New onset weakness or numbness of an extremity below the injection site  Hives or difficulty breathing ( go to the emergency room)  Inflammation or drainage at the injections site(s)  New symptoms which are concerning to you  Please note:  If effective, we will often do a series of 2-3 injections spaced 3-6 weeks apart to maximally decrease your pain.  If initial series is effective, you may be a candidate for a more permanent block of the lumbar sympathetic plexus.  If you have any questions please call [phone] [facility] Pain Management Discharge Instructions  General Discharge Instructions :  If you need to reach your doctor call: Monday-Friday 8:00 am - 4:00 pm at [phone] or toll free [phone].  After clinic hours [phone] to have operator reach doctor.  Bring all of your medication bottles to all your appointments in the pain clinic.  To cancel or reschedule your appointment with Pain Management please remember to call 24 hours in advance to avoid a fee.  Refer to the educational materials which you have been given on: General Risks, I had my Procedure. Discharge Instructions, Post Sedation.  Post Procedure Instructions:  The drugs you were given will stay in your system until tomorrow, so for the next 24 hours you should not drive, make any legal decisions or drink any alcoholic beverages.  You may eat anything you prefer, but it is better to start with liquids then soups and crackers, and gradually work up to solid foods.  Please notify your doctor immediately if you have any unusual bleeding, trouble breathing or pain that is not related to your normal pain.  Depending on the type of procedure that was done, some parts of your body may feel week and/or numb.  This usually clears up by tonight or the next day.  Walk with the use of an assistive device or accompanied by  an adult for the 24 hours.  You may use ice on the affected area for the first 24 hours.  Put ice in a Ziploc bag and cover with a towel and place against area 15 minutes on 15 minutes off.  You may switch to heat after 24 hours.

## 2016-04-18 NOTE — Progress Notes (Signed)
Safety precautions to be maintained throughout the outpatient stay will include: orient to surroundings, keep bed in low position, maintain call bell within reach at all times, provide assistance with transfer out of bed and ambulation.  

## 2016-04-18 NOTE — Progress Notes (Signed)
Subjective:    Patient ID: Laura [name], female    DOB: 1951-06-19, 65 y.o.   MRN: [id number]  HPI  PROCEDURE PERFORMED: Lumbar sympathetic block.  HISTORY OF PRESENT ILLNESS: The patient is 64 y.o. female who returns to [place] for further evaluation and treatment of pain involving the lower extremities. The patient is with history of Neuropathy with pain described as burning shooting stabbing sensations . There is concern regarding the patient's pain being due to diabetic neuropathy and decision has been made to proceed with lumbar sympathetic block in attempt to decrease severely disabling pain, prevent progression of patient's symptoms, and avoid the need for more involved treatment . The risks, benefits, and expectations of the procedure were discussed and explained to the patient who was understanding and wished to proceed with interventional treatment as planned.   DESCRIPTION OF PROCEDURE: Lumbar sympathetic block with IV Versed, IV fentanyl conscious sedation, EKG, blood pressure, pulse, capnography, and pulse oximetry monitoring. The procedure was performed with the patient in prone position under fluoroscopic guidance.   NEEDLE PLACEMENT AT L2, right side lumbar sympathetic block: With the patient in prone position and oblique orientation of 20 degrees, Betadine prep and local anesthetic skin wheal of 1.5% lidocaine plain was prepared at the proposed needle entry site. Under fluoroscopic guidance with 20 degrees oblique orientation, the 22 -gauge needle was inserted at the lateral border of the L2 vertebral body on the right side.   NEEDLE PLACEMENT AT L3, right side lumbar sympathetic block: With the patient in the prone position and oblique orientation of 20 degrees, Betadine prep and local anesthetic skin wheal of 1.5% lidocaine plain was prepared at the proposed needle entry site. Under fluoroscopic guidance with 20 degrees  oblique orientation, the 22 -gauge needle was  inserted at the lateral border of the L3 vertebral body on the right side.   NEEDLE PLACEMENT AT L4, right side lumbar sympathetic block: With the patient in prone position and oblique orientation of 20 degrees, Betadine prep and local anesthetic skin wheal of 1.5% lidocaine plain was prepared at the proposed needle entry site. Under fluoroscopic guidance with 20 degrees  oblique orientation, the 22 -gauge needle was inserted at the lateral border of the L4 vertebral body on the right side.    Following needle placement at the L2, L3 and L4 vertebral body levels on the right side, needle placement was then verified on lateral view with tip of the needle documented to be in the anterior third of the vertebral body of L2, L3 and L4 respectively. Following negative aspiration of each needle for heme and CSF, L2 vertebral body level needle was injected with 10 mL of 0.25% bupivacaine. L3 vertebral body level needle was injected with 10 mL of 0.25% bupivacaine. L4 vertebral body level was injected with 10 mL of 0.25% bupivacaine. Needles were removed. Please note temperature readings prior to lumbar sympathetic block were noted to be 81.6 degrees Fahrenheit and following completion of the lumbar sympathetic block temperature readings of the lower extremity were noted to be 89.6 degrees Fahrenheit.    The patient tolerated the procedure well.  PLAN:   1. Medications: We will continue presently prescribed medications at this time. 2. The patient is to follow-up with primary care physician Dr. Kary Kos for further evaluation of blood pressure and general medical condition as discussed. 3. Surgical evaluation as discussed. 4. Neurological evaluation as discussed. 5. The patient may be candidate for radiofrequency procedures, implantation devices, and other  treatment pending response to treatment and follow-up evaluation. 6. The patient has been advised to adhere to proper body mechanics. 7. The patient has  been advised to call the Pain Management Center prior to scheduled return appointment should there be significant change in condition or have other concerns regarding condition prior to scheduled return appointment.   The patient was understanding and in agreement with suggested treatment plan.   Review of Systems     Objective:   Physical Exam        Assessment & Plan:

## 2016-04-21 ENCOUNTER — Telehealth: Payer: Self-pay | Admitting: *Deleted

## 2016-04-21 NOTE — Telephone Encounter (Signed)
patient denies problems post procedure

## 2016-04-22 ENCOUNTER — Telehealth: Payer: Self-pay | Admitting: Pain Medicine

## 2016-04-22 NOTE — Telephone Encounter (Signed)
Patient called and informed that she has refills at [facility] and she can discuss refills with dr. Primus Bravo at next appt.

## 2016-04-22 NOTE — Telephone Encounter (Signed)
Patient wants to know if dr crisp has permission to write meds and if so when will he be writing them, have we heard back from Dr. Melrose Nakayama regarding this?

## 2016-05-20 ENCOUNTER — Ambulatory Visit: Payer: Medicare Other | Attending: Pain Medicine | Admitting: Pain Medicine

## 2016-05-20 ENCOUNTER — Encounter: Payer: Self-pay | Admitting: Pain Medicine

## 2016-05-20 VITALS — BP 124/63 | HR 75 | Temp 98.2°F | Resp 15 | Ht 65.0 in | Wt 184.0 lb

## 2016-05-20 DIAGNOSIS — E0849 Diabetes mellitus due to underlying condition with other diabetic neurological complication: Secondary | ICD-10-CM

## 2016-05-20 DIAGNOSIS — E0841 Diabetes mellitus due to underlying condition with diabetic mononeuropathy: Secondary | ICD-10-CM

## 2016-05-20 DIAGNOSIS — M79605 Pain in left leg: Secondary | ICD-10-CM | POA: Diagnosis present

## 2016-05-20 DIAGNOSIS — M79604 Pain in right leg: Secondary | ICD-10-CM | POA: Diagnosis present

## 2016-05-20 DIAGNOSIS — M5136 Other intervertebral disc degeneration, lumbar region: Secondary | ICD-10-CM

## 2016-05-20 DIAGNOSIS — E1140 Type 2 diabetes mellitus with diabetic neuropathy, unspecified: Secondary | ICD-10-CM | POA: Insufficient documentation

## 2016-05-20 DIAGNOSIS — M1990 Unspecified osteoarthritis, unspecified site: Secondary | ICD-10-CM

## 2016-05-20 NOTE — Progress Notes (Signed)
   Subjective:    Patient ID: Laura [name], female    DOB: [birth year], 65 y.o.   MRN: [id number]  HPI  The patient is a 65 year old female who returns to pain management for further evaluation and treatment of pain involving the region of the lower extremity regions. The patient stated that she had decreased in terms of sensations involving the lower extremity following prior interventional treatment. The patient states that she has occasional burning stinging pain of the lower extremity region. The patient also has some pain involving the region of the back. We discussed patient's condition and discussed proceeding with lumbosacral selective nerve root block in attempt to decrease the pain of the lower extremities and decreased pain involving the lower back region is well. There has been concern regarding patient's pain began due to neuropathy and there is also concern regarding intraspinal abnormalities of the lumbar region treated to patient's symptomatology. We discussed patient's condition and patient will continue Neurontin and Cymbalta and we will proceed with lumbosacral selective nerve root block at time of return appointment. All agreed to suggested treatment plan.   Review of Systems     Objective:   Physical Exam  There was tenderness over the splenius capitis and occipitalis regions palpation which reproduces mild discomfort with mild tenderness over the cervical facet cervical paraspinal muscular treat regions. There was mild tenderness of the acromioclavicular and glenohumeral joint regions. Palpation over the thoracic region thoracic facet region was attends to palpation with crepitus of the thoracic region noted. There appeared to be unremarkable Spurling's maneuver. The patient appeared to be with bilaterally equal grip strength and Tinel and Phalen's maneuver were without increased pain of significant degree. Palpation over the lumbar paraspinal musculature region lumbar facet region  was attends to palpation of moderate degree with lateral bending rotation extension and palpation of the lumbar facets reproducing moderate discomfort. There was moderate tenderness of the PSIS and PII S region as well as mild tenderness of the greater trochanteric region iliotibial band region. Straight leg raising was tolerates approximately 30 without a definite increased pain with dorsiflexion noted. EHL strength appeared to be slightly decreased. There was tenderness to palpation of the lower extremities of mild degree with negative clonus negative Homans. No definite sensory deficit of dermatomal distribution was detected. DTRs appeared to be trace at the knees. There was negative clonus negative Homans Abdomen was nontender and no costovertebral tenderness was noted      Assessment & Plan:     Diabetes mellitus with diabetic neuropathy  Osteoarthritis      PLAN   Continue present medications Neurontin Cymbalta Klonopin  Patient advised to avoid Naprosyn and nonsteroidal anti-inflammatory medications.  May consider amitriptyline and other medications as discussed  Lumbosacral selective nerve root block to be performed at time of return appointment  F/U PCP Dr. Kary Kos for evaliation of  BP and general medical  condition  F/U surgical evaluation. May consider pending follow-up evaluations  F/U neurological evaluation. May consider PNCV/EMG studies and other studies pending follow-up evaluations  Will consider lumbar MRI as discussed  May consider radiofrequency rhizolysis or intraspinal procedures pending response to present treatment and F/U evaluation   [facility] to call [facility] should patient have concerns prior to scheduled return appointment.

## 2016-05-20 NOTE — Progress Notes (Signed)
Safety precautions to be maintained throughout the outpatient stay will include: orient to surroundings, keep bed in low position, maintain call bell within reach at all times, provide assistance with transfer out of bed and ambulation.  

## 2016-05-20 NOTE — Patient Instructions (Addendum)
PLAN   Continue present medications Neurontin Cymbalta Klonopin  Patient advised to avoid Naprosyn and nonsteroidal anti-inflammatory medications. May consider amitriptyline and other medications as discussed  Lumbosacral selective nerve root block to be performed at time of return appointment  F/U PCP Dr. Kary Kos for evaliation of  BP and general medical  condition  F/U surgical evaluation. May consider pending follow-up evaluations  F/U neurological evaluation. May consider PNCV/EMG studies and other studies pending follow-up evaluations  May consider radiofrequency rhizolysis or intraspinal procedures pending response to present treatment and F/U evaluation   [facility] to call [facility] should patient have concerns prior to scheduled return appointment.    Selective Nerve Root Block Patient Information  Description: Specific nerve roots exit the spinal canal and these nerves can be compressed and inflamed by a bulging disc and bone spurs.  By injecting steroids on the nerve root, we can potentially decrease the inflammation surrounding these nerves, which often leads to decreased pain.  Also, by injecting local anesthesia on the nerve root, this can provide [place] helpful information to give to your referring doctor if it decreases your pain.  Selective nerve root blocks can be done along the spine from the neck to the low back depending on the location of your pain.   After numbing the skin with local anesthesia, a small needle is passed to the nerve root and the position of the needle is verified using x-ray pictures.  After the needle is in correct position, we then deposit the medication.  You may experience a pressure sensation while this is being done.  The entire block usually lasts less than 15 minutes.  Conditions that may be treated with selective nerve root blocks:  Low back and leg pain  Spinal stenosis  Diagnostic block prior to potential surgery  Neck and arm  pain  Post laminectomy syndrome  Preparation for the injection:   Do not eat any solid food or dairy products within 8 hours of your appointment.  You may drink clear liquids up to 3 hours before an appointment.  Clear liquids include water, black coffee, juice or soda.  No milk or cream please.  You may take your regular medications, including pain medications, with a sip of water before your appointment.  Diabetics should hold regular insulin (if taken separately) and take 1/2 normal NPH dose the morning of the procedure.  Carry some sugar containing items with you to your appointment.  A driver must accompany you and be prepared to drive you home after your procedure.  Bring all your current medications with you.  An IV may be inserted and sedation may be given at the discretion of the physician.  A blood pressure cuff, EKG, and other monitors will often be applied during the procedure.  Some patients may need to have extra oxygen administered for a short period.  You will be asked to provide medical information, including allergies, prior to the procedure.  We must know immediately if you are taking blood  Thinners (like Coumadin) or if you are allergic to IV iodine contrast (dye).  Possible side-effects: All are usually temporary  Bleeding from needle site  Light headedness  Numbness and tingling  Decreased blood pressure  Weakness in arms/legs  Pressure sensation in back/neck  Pain at injection site (several days)  Possible complications: All are extremely rare  Infection  Nerve injury  Spinal headache (a headache wore with upright position)  Call if you experience:  Fever/chills associated with  headache or increased back/neck pain  Headache worsened by an upright position  New onset weakness or numbness of an extremity below the injection site  Hives or difficulty breathing (go to the emergency room)  Inflammation or drainage at the injection  site(s)  Severe back/neck pain greater than usual  New symptoms which are concerning to you  Please note:  Although the local anesthetic injected can often make your back or neck feel good for several hours after the injection the pain will likely return.  It takes 3-5 days for steroids to work on the nerve root. You may not notice any pain relief for at least one week.  If effective, we will often do a series of 3 injections spaced 3-6 weeks apart to maximally decrease your pain.    If you have any questions, please call [phone] [facility]   GENERAL RISKS AND COMPLICATIONS  What are the risk, side effects and possible complications? Generally speaking, most procedures are safe.  However, with any procedure there are risks, side effects, and the possibility of complications.  The risks and complications are dependent upon the sites that are lesioned, or the type of nerve block to be performed.  The closer the procedure is to the spine, the more serious the risks are.  Great care is taken when placing the radio frequency needles, block needles or lesioning probes, but sometimes complications can occur.  Infection: Any time there is an injection through the skin, there is a risk of infection.  This is why sterile conditions are used for these blocks.  There are four possible types of infection.  Localized skin infection.  Central Nervous System Infection-This can be in the form of Meningitis, which can be deadly.  Epidural Infections-This can be in the form of an epidural abscess, which can cause pressure inside of the spine, causing compression of the spinal cord with subsequent paralysis. This would require an emergency surgery to decompress, and there are no guarantees that the patient would recover from the paralysis.  Discitis-This is an infection of the intervertebral discs.  It occurs in about 1% of discography procedures.  It is difficult to  treat and it may lead to surgery.        2. Pain: the needles have to go through skin and soft tissues, will cause soreness.       3. Damage to internal structures:  The nerves to be lesioned may be near blood vessels or    other nerves which can be potentially damaged.       4. Bleeding: Bleeding is more common if the patient is taking blood thinners such as  aspirin, Coumadin, Ticiid, Plavix, etc., or if he/she have some genetic predisposition  such as hemophilia. Bleeding into the spinal canal can cause compression of the spinal  cord with subsequent paralysis.  This would require an emergency surgery to  decompress and there are no guarantees that the patient would recover from the  paralysis.       5. Pneumothorax:  Puncturing of a lung is a possibility, every time a needle is introduced in  the area of the chest or upper back.  Pneumothorax refers to free air around the  collapsed lung(s), inside of the thoracic cavity (chest cavity).  Another two possible  complications related to a similar event would include: Hemothorax and Chylothorax.   These are variations of the Pneumothorax, where instead of air around the collapsed  lung(s), you  may have blood or chyle, respectively.       6. Spinal headaches: They may occur with any procedures in the area of the spine.       7. Persistent CSF (Cerebro-Spinal Fluid) leakage: This is a rare problem, but may occur  with prolonged intrathecal or epidural catheters either due to the formation of a fistulous  track or a dural tear.       8. Nerve damage: By working so close to the spinal cord, there is always a possibility of  nerve damage, which could be as serious as a permanent spinal cord injury with  paralysis.       9. Death:  Although rare, severe deadly allergic reactions known as "Anaphylactic  reaction" can occur to any of the medications used.      10. Worsening of the symptoms:  We can always make thing worse.  What are the chances of something  like this happening? Chances of any of this occuring are extremely low.  By statistics, you have more of a chance of getting killed in a motor vehicle accident: while driving to the hospital than any of the above occurring .  Nevertheless, you should be aware that they are possibilities.  In general, it is similar to taking a shower.  Everybody knows that you can slip, hit your head and get killed.  Does that mean that you should not shower again?  Nevertheless always keep in mind that statistics do not mean anything if you happen to be on the wrong side of them.  Even if a procedure has a 1 (one) in a 1,000,000 (million) chance of going wrong, it you happen to be that one..Also, keep in mind that by statistics, you have more of a chance of having something go wrong when taking medications.  Who should not have this procedure? If you are on a blood thinning medication (e.g. Coumadin, Plavix, see list of "Blood Thinners"), or if you have an active infection going on, you should not have the procedure.  If you are taking any blood thinners, please inform your physician.  How should I prepare for this procedure?  Do not eat or drink anything at least six hours prior to the procedure.  Bring a driver with you .  It cannot be a taxi.  Come accompanied by an adult that can drive you back, and that is strong enough to help you if your legs get weak or numb from the local anesthetic.  Take all of your medicines the morning of the procedure with just enough water to swallow them.  If you have diabetes, make sure that you are scheduled to have your procedure done first thing in the morning, whenever possible.  If you have diabetes, take only half of your insulin dose and notify our nurse that you have done so as soon as you arrive at the clinic.  If you are diabetic, but only take blood sugar pills (oral hypoglycemic), then do not take them on the morning of your procedure.  You may take them after you  have had the procedure.  Do not take aspirin or any aspirin-containing medications, at least eleven (11) days prior to the procedure.  They may prolong bleeding.  Wear loose fitting clothing that may be easy to take off and that you would not mind if it got stained with Betadine or blood.  Do not wear any jewelry or perfume  Remove any nail coloring.  It will interfere  with some of our monitoring equipment.  NOTE: Remember that this is not meant to be interpreted as a complete list of all possible complications.  Unforeseen problems may occur.  BLOOD THINNERS The following drugs contain aspirin or other products, which can cause increased bleeding during surgery and should not be taken for 2 weeks prior to and 1 week after surgery.  If you should need take something for relief of minor pain, you may take acetaminophen which is found in Tylenol,m Datril, Anacin-3 and Panadol. It is not blood thinner. The products listed below are.  Do not take any of the products listed below in addition to any listed on your instruction sheet.  A.P.C or A.P.C with Codeine Codeine Phosphate Capsules #3 Ibuprofen Ridaura  ABC compound Congesprin Imuran rimadil  Advil Cope Indocin Robaxisal  Alka-Seltzer Effervescent Pain Reliever and Antacid Coricidin or Coricidin-D  Indomethacin Rufen  Alka-Seltzer plus Cold Medicine Cosprin Ketoprofen S-A-C Tablets  Anacin Analgesic Tablets or Capsules Coumadin Korlgesic Salflex  Anacin Extra Strength Analgesic tablets or capsules CP-2 Tablets Lanoril Salicylate  Anaprox Cuprimine Capsules Levenox Salocol  Anexsia-D Dalteparin Magan Salsalate  Anodynos Darvon compound Magnesium Salicylate Sine-off  Ansaid Dasin Capsules Magsal Sodium Salicylate  Anturane Depen Capsules Marnal Soma  APF Arthritis pain formula Dewitt's Pills Measurin Stanback  Argesic Dia-Gesic Meclofenamic Sulfinpyrazone  Arthritis Bayer Timed Release Aspirin Diclofenac Meclomen Sulindac  Arthritis pain  formula Anacin Dicumarol Medipren Supac  Analgesic (Safety coated) Arthralgen Diffunasal Mefanamic Suprofen  Arthritis Strength Bufferin Dihydrocodeine Mepro Compound Suprol  Arthropan liquid Dopirydamole Methcarbomol with Aspirin Synalgos  ASA tablets/Enseals Disalcid Micrainin Tagament  Ascriptin Doan's Midol Talwin  Ascriptin A/D Dolene Mobidin Tanderil  Ascriptin Extra Strength Dolobid Moblgesic Ticlid  Ascriptin with Codeine Doloprin or Doloprin with Codeine Momentum Tolectin  Asperbuf Duoprin Mono-gesic Trendar  Aspergum Duradyne Motrin or Motrin IB Triminicin  Aspirin plain, buffered or enteric coated Durasal Myochrisine Trigesic  Aspirin Suppositories Easprin Nalfon Trillsate  Aspirin with Codeine Ecotrin Regular or Extra Strength Naprosyn Uracel  Atromid-S Efficin Naproxen Ursinus  Auranofin Capsules Elmiron Neocylate Vanquish  Axotal Emagrin Norgesic Verin  Azathioprine Empirin or Empirin with Codeine Normiflo Vitamin E  Azolid Emprazil Nuprin Voltaren  Bayer Aspirin plain, buffered or children's or timed BC Tablets or powders Encaprin Orgaran Warfarin Sodium  Buff-a-Comp Enoxaparin Orudis Zorpin  Buff-a-Comp with Codeine Equegesic Os-Cal-Gesic   Buffaprin Excedrin plain, buffered or Extra Strength Oxalid   Bufferin Arthritis Strength Feldene Oxphenbutazone   Bufferin plain or Extra Strength Feldene Capsules Oxycodone with Aspirin   Bufferin with Codeine Fenoprofen Fenoprofen Pabalate or Pabalate-SF   Buffets II Flogesic Panagesic   Buffinol plain or Extra Strength Florinal or Florinal with Codeine Panwarfarin   Buf-Tabs Flurbiprofen Penicillamine   Butalbital Compound Four-way cold tablets Penicillin   Butazolidin Fragmin Pepto-Bismol   Carbenicillin Geminisyn Percodan   Carna Arthritis Reliever Geopen Persantine   Carprofen Gold's salt Persistin   Chloramphenicol Goody's Phenylbutazone   Chloromycetin Haltrain Piroxlcam   Clmetidine heparin Plaquenil   Cllnoril  Hyco-pap Ponstel   Clofibrate Hydroxy chloroquine Propoxyphen         Before stopping any of these medications, be sure to consult the physician who ordered them.  Some, such as Coumadin (Warfarin) are ordered to prevent or treat serious conditions such as "deep thrombosis", "pumonary embolisms", and other heart problems.  The amount of time that you may need off of the medication may also vary with the medication and the reason for which you were taking it.  If you are taking any of these medications, please make sure you notify your pain physician before you undergo any procedures.         Trigger Point Injection Trigger points are areas where you have muscle pain. A trigger point injection is a shot given in the trigger point to relieve that pain. A trigger point might feel like a knot in your muscle. It hurts to press on a trigger point. Sometimes the pain spreads out (radiates) to other parts of the body. For example, pressing on a trigger point in your shoulder might cause pain in your arm or neck. You might have one trigger point. Or, you might have more than one. People often have trigger points in their upper back and lower back. They also occur often in the neck and shoulders. Pain from a trigger point lasts for a long time. It can make it hard to keep moving. You might not be able to do the exercise or physical therapy that could help you deal with the pain. A trigger point injection may help. It does not work for everyone. But, it may relieve your pain for a few days or a few months. A trigger point injection does not cure long-lasting (chronic) pain. LET YOUR CAREGIVER KNOW ABOUT:  Any allergies (especially to latex, lidocaine, or steroids).  Blood-thinning medicines that you take. These drugs can lead to bleeding or bruising after an injection. They include:  Aspirin.  Ibuprofen.  Clopidogrel.  Warfarin.  Other medicines you take. This includes all vitamins, herbs,  eyedrops, over-the-counter medicines, and creams.  Use of steroids.  Recent infections.  Past problems with numbing medicines.  Bleeding problems.  Surgeries you have had.  Other health problems. RISKS AND COMPLICATIONS A trigger point injection is a safe treatment. However, problems may develop, such as:  Minor side effects usually go away in 1 to 2 days. These may include:  Soreness.  Bruising.  Stiffness.  More serious problems are rare. But, they may include:  Bleeding under the skin (hematoma).  Skin infection.  Breaking off of the needle under your skin.  Lung puncture.  The trigger point injection may not work for you. BEFORE THE PROCEDURE You may need to stop taking any medicine that thins your blood. This is to prevent bleeding and bruising. Usually these medicines are stopped several days before the injection. No other preparation is needed. PROCEDURE  A trigger point injection can be given in your caregiver's office or in a clinic. Each injection takes 2 minutes or less.  Your caregiver will feel for trigger points. The caregiver may use a marker to circle the area for the injection.  The skin over the trigger point will be washed with a germ-killing (antiseptic) solution.  The caregiver pinches the spot for the injection.  Then, a very thin needle is used for the shot. You may feel pain or a twitching feeling when the needle enters the trigger point.  A numbing solution may be injected into the trigger point. Sometimes a drug to keep down swelling, redness, and warmth (inflammation) is also injected.  Your caregiver moves the needle around the trigger zone until the tightness and twitching goes away.  After the injection, your caregiver may put gentle pressure over the injection site.  Then it is covered with a bandage. AFTER THE PROCEDURE  You can go right home after the injection.  The bandage can be taken off after a few hours.  You may feel  sore  and stiff for 1 to 2 days.  Go back to your regular activities slowly. Your caregiver may ask you to stretch your muscles. Do not do anything that takes extra energy for a few days.  Follow your caregiver's instructions to manage and treat other pain.   This information is not intended to replace advice given to you by your health care provider. Make sure you discuss any questions you have with your health care provider.   Document Released: 11/13/2011 Document Revised: 03/21/2013 Document Reviewed: 11/13/2011 Elsevier Interactive Patient Education 2016 [place]  What are the risk, side effects and possible complications? Generally speaking, most procedures are safe.  However, with any procedure there are risks, side effects, and the possibility of complications.  The risks and complications are dependent upon the sites that are lesioned, or the type of nerve block to be performed.  The closer the procedure is to the spine, the more serious the risks are.  Great care is taken when placing the radio frequency needles, block needles or lesioning probes, but sometimes complications can occur.  Infection: Any time there is an injection through the skin, there is a risk of infection.  This is why sterile conditions are used for these blocks.  There are four possible types of infection.  Localized skin infection.  Central Nervous System Infection-This can be in the form of Meningitis, which can be deadly.  Epidural Infections-This can be in the form of an epidural abscess, which can cause pressure inside of the spine, causing compression of the spinal cord with subsequent paralysis. This would require an emergency surgery to decompress, and there are no guarantees that the patient would recover from the paralysis.  Discitis-This is an infection of the intervertebral discs.  It occurs in about 1% of discography procedures.  It is difficult to treat and it  may lead to surgery.        2. Pain: the needles have to go through skin and soft tissues, will cause soreness.       3. Damage to internal structures:  The nerves to be lesioned may be near blood vessels or    other nerves which can be potentially damaged.       4. Bleeding: Bleeding is more common if the patient is taking blood thinners such as  aspirin, Coumadin, Ticiid, Plavix, etc., or if he/she have some genetic predisposition  such as hemophilia. Bleeding into the spinal canal can cause compression of the spinal  cord with subsequent paralysis.  This would require an emergency surgery to  decompress and there are no guarantees that the patient would recover from the  paralysis.       5. Pneumothorax:  Puncturing of a lung is a possibility, every time a needle is introduced in  the area of the chest or upper back.  Pneumothorax refers to free air around the  collapsed lung(s), inside of the thoracic cavity (chest cavity).  Another two possible  complications related to a similar event would include: Hemothorax and Chylothorax.   These are variations of the Pneumothorax, where instead of air around the collapsed  lung(s), you may have blood or chyle, respectively.       6. Spinal headaches: They may occur with any procedures in the area of the spine.       7. Persistent CSF (Cerebro-Spinal Fluid) leakage: This is a rare problem, but may occur  with prolonged intrathecal or epidural catheters either due to  the formation of a fistulous  track or a dural tear.       8. Nerve damage: By working so close to the spinal cord, there is always a possibility of  nerve damage, which could be as serious as a permanent spinal cord injury with  paralysis.       9. Death:  Although rare, severe deadly allergic reactions known as "Anaphylactic  reaction" can occur to any of the medications used.      10. Worsening of the symptoms:  We can always make thing worse.  What are the chances of something like this  happening? Chances of any of this occuring are extremely low.  By statistics, you have more of a chance of getting killed in a motor vehicle accident: while driving to the hospital than any of the above occurring .  Nevertheless, you should be aware that they are possibilities.  In general, it is similar to taking a shower.  Everybody knows that you can slip, hit your head and get killed.  Does that mean that you should not shower again?  Nevertheless always keep in mind that statistics do not mean anything if you happen to be on the wrong side of them.  Even if a procedure has a 1 (one) in a 1,000,000 (million) chance of going wrong, it you happen to be that one..Also, keep in mind that by statistics, you have more of a chance of having something go wrong when taking medications.  Who should not have this procedure? If you are on a blood thinning medication (e.g. Coumadin, Plavix, see list of "Blood Thinners"), or if you have an active infection going on, you should not have the procedure.  If you are taking any blood thinners, please inform your physician.  How should I prepare for this procedure?  Do not eat or drink anything at least six hours prior to the procedure.  Bring a driver with you .  It cannot be a taxi.  Come accompanied by an adult that can drive you back, and that is strong enough to help you if your legs get weak or numb from the local anesthetic.  Take all of your medicines the morning of the procedure with just enough water to swallow them.  If you have diabetes, make sure that you are scheduled to have your procedure done first thing in the morning, whenever possible.  If you have diabetes, take only half of your insulin dose and notify our nurse that you have done so as soon as you arrive at the clinic.  If you are diabetic, but only take blood sugar pills (oral hypoglycemic), then do not take them on the morning of your procedure.  You may take them after you have had the  procedure.  Do not take aspirin or any aspirin-containing medications, at least eleven (11) days prior to the procedure.  They may prolong bleeding.  Wear loose fitting clothing that may be easy to take off and that you would not mind if it got stained with Betadine or blood.  Do not wear any jewelry or perfume  Remove any nail coloring.  It will interfere with some of our monitoring equipment.  NOTE: Remember that this is not meant to be interpreted as a complete list of all possible complications.  Unforeseen problems may occur.  BLOOD THINNERS The following drugs contain aspirin or other products, which can cause increased bleeding during surgery and should not be taken for 2 weeks prior  to and 1 week after surgery.  If you should need take something for relief of minor pain, you may take acetaminophen which is found in Tylenol,m Datril, Anacin-3 and Panadol. It is not blood thinner. The products listed below are.  Do not take any of the products listed below in addition to any listed on your instruction sheet.  A.P.C or A.P.C with Codeine Codeine Phosphate Capsules #3 Ibuprofen Ridaura  ABC compound Congesprin Imuran rimadil  Advil Cope Indocin Robaxisal  Alka-Seltzer Effervescent Pain Reliever and Antacid Coricidin or Coricidin-D  Indomethacin Rufen  Alka-Seltzer plus Cold Medicine Cosprin Ketoprofen S-A-C Tablets  Anacin Analgesic Tablets or Capsules Coumadin Korlgesic Salflex  Anacin Extra Strength Analgesic tablets or capsules CP-2 Tablets Lanoril Salicylate  Anaprox Cuprimine Capsules Levenox Salocol  Anexsia-D Dalteparin Magan Salsalate  Anodynos Darvon compound Magnesium Salicylate Sine-off  Ansaid Dasin Capsules Magsal Sodium Salicylate  Anturane Depen Capsules Marnal Soma  APF Arthritis pain formula Dewitt's Pills Measurin Stanback  Argesic Dia-Gesic Meclofenamic Sulfinpyrazone  Arthritis Bayer Timed Release Aspirin Diclofenac Meclomen Sulindac  Arthritis pain formula  Anacin Dicumarol Medipren Supac  Analgesic (Safety coated) Arthralgen Diffunasal Mefanamic Suprofen  Arthritis Strength Bufferin Dihydrocodeine Mepro Compound Suprol  Arthropan liquid Dopirydamole Methcarbomol with Aspirin Synalgos  ASA tablets/Enseals Disalcid Micrainin Tagament  Ascriptin Doan's Midol Talwin  Ascriptin A/D Dolene Mobidin Tanderil  Ascriptin Extra Strength Dolobid Moblgesic Ticlid  Ascriptin with Codeine Doloprin or Doloprin with Codeine Momentum Tolectin  Asperbuf Duoprin Mono-gesic Trendar  Aspergum Duradyne Motrin or Motrin IB Triminicin  Aspirin plain, buffered or enteric coated Durasal Myochrisine Trigesic  Aspirin Suppositories Easprin Nalfon Trillsate  Aspirin with Codeine Ecotrin Regular or Extra Strength Naprosyn Uracel  Atromid-S Efficin Naproxen Ursinus  Auranofin Capsules Elmiron Neocylate Vanquish  Axotal Emagrin Norgesic Verin  Azathioprine Empirin or Empirin with Codeine Normiflo Vitamin E  Azolid Emprazil Nuprin Voltaren  Bayer Aspirin plain, buffered or children's or timed BC Tablets or powders Encaprin Orgaran Warfarin Sodium  Buff-a-Comp Enoxaparin Orudis Zorpin  Buff-a-Comp with Codeine Equegesic Os-Cal-Gesic   Buffaprin Excedrin plain, buffered or Extra Strength Oxalid   Bufferin Arthritis Strength Feldene Oxphenbutazone   Bufferin plain or Extra Strength Feldene Capsules Oxycodone with Aspirin   Bufferin with Codeine Fenoprofen Fenoprofen Pabalate or Pabalate-SF   Buffets II Flogesic Panagesic   Buffinol plain or Extra Strength Florinal or Florinal with Codeine Panwarfarin   Buf-Tabs Flurbiprofen Penicillamine   Butalbital Compound Four-way cold tablets Penicillin   Butazolidin Fragmin Pepto-Bismol   Carbenicillin Geminisyn Percodan   Carna Arthritis Reliever Geopen Persantine   Carprofen Gold's salt Persistin   Chloramphenicol Goody's Phenylbutazone   Chloromycetin Haltrain Piroxlcam   Clmetidine heparin Plaquenil   Cllnoril Hyco-pap  Ponstel   Clofibrate Hydroxy chloroquine Propoxyphen         Before stopping any of these medications, be sure to consult the physician who ordered them.  Some, such as Coumadin (Warfarin) are ordered to prevent or treat serious conditions such as "deep thrombosis", "pumonary embolisms", and other heart problems.  The amount of time that you may need off of the medication may also vary with the medication and the reason for which you were taking it.  If you are taking any of these medications, please make sure you notify your pain physician before you undergo any procedures.

## 2016-05-30 LAB — TOXASSURE SELECT 13 (MW), URINE: PDF: 0

## 2016-06-02 ENCOUNTER — Ambulatory Visit: Payer: Medicare Other | Attending: Pain Medicine | Admitting: Pain Medicine

## 2016-06-02 ENCOUNTER — Encounter: Payer: Self-pay | Admitting: Pain Medicine

## 2016-06-02 VITALS — BP 138/76 | HR 77 | Temp 98.1°F | Resp 14 | Ht 64.0 in | Wt 180.0 lb

## 2016-06-02 DIAGNOSIS — M5136 Other intervertebral disc degeneration, lumbar region: Secondary | ICD-10-CM

## 2016-06-02 DIAGNOSIS — M545 Low back pain: Secondary | ICD-10-CM | POA: Insufficient documentation

## 2016-06-02 DIAGNOSIS — E0849 Diabetes mellitus due to underlying condition with other diabetic neurological complication: Secondary | ICD-10-CM

## 2016-06-02 DIAGNOSIS — M79606 Pain in leg, unspecified: Secondary | ICD-10-CM | POA: Diagnosis present

## 2016-06-02 DIAGNOSIS — M1990 Unspecified osteoarthritis, unspecified site: Secondary | ICD-10-CM

## 2016-06-02 DIAGNOSIS — E0841 Diabetes mellitus due to underlying condition with diabetic mononeuropathy: Secondary | ICD-10-CM

## 2016-06-02 MED ORDER — MIDAZOLAM HCL 5 MG/5ML IJ SOLN
5.0000 mg | Freq: Once | INTRAMUSCULAR | Status: AC
  Administered 2016-06-02: 2 mg via INTRAVENOUS
  Filled 2016-06-02: qty 5

## 2016-06-02 MED ORDER — TRIAMCINOLONE ACETONIDE 40 MG/ML IJ SUSP
40.0000 mg | Freq: Once | INTRAMUSCULAR | Status: AC
  Administered 2016-06-02: 40 mg
  Filled 2016-06-02: qty 1

## 2016-06-02 MED ORDER — AZITHROMYCIN 250 MG PO TABS: ORAL_TABLET | ORAL | Status: DC

## 2016-06-02 MED ORDER — FENTANYL CITRATE (PF) 100 MCG/2ML IJ SOLN
100.0000 ug | Freq: Once | INTRAMUSCULAR | Status: AC
  Administered 2016-06-02: 100 ug via INTRAVENOUS
  Filled 2016-06-02: qty 2

## 2016-06-02 MED ORDER — BUPIVACAINE HCL (PF) 0.25 % IJ SOLN
30.0000 mL | Freq: Once | INTRAMUSCULAR | Status: AC
  Administered 2016-06-02: 30 mL
  Filled 2016-06-02: qty 30

## 2016-06-02 MED ORDER — LACTATED RINGERS IV SOLN
1000.0000 mL | INTRAVENOUS | Status: DC
  Administered 2016-06-02: 1000 mL via INTRAVENOUS

## 2016-06-02 MED ORDER — ORPHENADRINE CITRATE 30 MG/ML IJ SOLN
60.0000 mg | Freq: Once | INTRAMUSCULAR | Status: AC
  Administered 2016-06-02: 60 mg via INTRAMUSCULAR
  Filled 2016-06-02: qty 2

## 2016-06-02 MED ORDER — LIDOCAINE HCL (PF) 1 % IJ SOLN
10.0000 mL | Freq: Once | INTRAMUSCULAR | Status: AC
  Administered 2016-06-02: 10 mL via SUBCUTANEOUS

## 2016-06-02 NOTE — Progress Notes (Signed)
Safety precautions to be maintained throughout the outpatient stay will include: orient to surroundings, keep bed in low position, maintain call bell within reach at all times, provide assistance with transfer out of bed and ambulation.  

## 2016-06-02 NOTE — Progress Notes (Signed)
   Subjective:    Patient ID: Laura [name], female    DOB: 23-Mar-1951, 65 y.o.   MRN: [id number]  HPI  PROCEDURE PERFORMED: Lumbosacral selective nerve root block   NOTE: The patient is a 65 y.o. female who returns to [facility] for further evaluation and treatment of pain involving the lumbar and lower extremity region.. There is concern regarding patient's symptoms being due to neuropathy. There is also concern regarding intraspinal abnormalities which may be contributing to patient's symptomatology. Decision has been made to proceed with lumbosacral selective nerve root block in attempt to decrease severity of patient's symptoms, minimize progression of patient's symptoms, and avoid the need for more involved treatment.. The risks, benefits, and expectations of the procedure have been explained to the patient who was understanding and in agreement with suggested treatment plan. We will proceed with interventional treatment as discussed and as explained to the patient. The patient is understanding and in agreement with suggested treatment plan.   DESCRIPTION OF PROCEDURE: Lumbosacral selective nerve root block with IV Versed, IV fentanyl conscious sedation, EKG, blood pressure, pulse, capnography, and pulse oximetry monitoring. The procedure was performed with the patient in the prone position under fluoroscopic guidance. With the patient in the prone position, Betadine prep of proposed entry site was performed. Local anesthetic skin wheal of proposed needle entry site was prepared with 1.5% plain lidocaine with AP view of the lumbosacral spine.   PROCEDURE #1: Needle placement at the right L 2 vertebral body: A 22 -gauge needle was inserted at the inferior border of the transverse process of the vertebral body with needle placed medial to the midline of the transverse process on AP view of the lumbosacral spine.   NEEDLE PLACEMENT AT  L3, L4, and L5  VERTEBRAL BODY LEVELS  Needle   placement was accomplished at L3, L4, and L5  vertebral body levels on the right side exactly as was accomplished at the L2  vertebral body level  and utilizing the same technique and under fluoroscopic guidance.    Needle placement was then verified on lateral view at all levels with needle tip documented to be in the posterior superior quadrant of the intervertebral foramen of  L 2, L3, L4, and L5. Following negative aspiration for heme and CSF at each level, each level was injected with 3 mL of 0.25% bupivacaine with Kenalog.      The patient tolerated the procedure well.   A total of 10 mg of Kenalog was utilized for the procedure.   PLAN:  1. Medications: Will continue presently prescribed medication. 2. The patient is to undergo follow-up evaluation with PCP Dr. Kary Kos  for evaluation of blood pressure and general medical condition status post procedure performed on today's visit. 3. Surgical follow-up evaluation. Has been addressed  4. Neurological evaluation. The patient will follow-up with Dr. Melrose Nakayama as discussed 5. May consider radiofrequency procedures, implantation type procedures and other treatment pending response to treatment and follow-up evaluation. 6. The patient has been advise do adhere to proper body mechanics and avoid activities which may aggravate condition. 7. The patient has been advised to call the Pain Management Center prior to scheduled return appointment should there be significant change in the patient's condition or should the patient have other concerns regarding condition prior to scheduled return appointment.      Review of Systems     Objective:   Physical Exam        Assessment & Plan:

## 2016-06-02 NOTE — Patient Instructions (Addendum)
PLAN   Continue present medications Neurontin Cymbalta Klonopin Patient no longer taking tramadol tramadol.   Please begin antibiotic azithromycin(Z-Pak)  today as prescribed  F/U PCP Dr. Kary Kos for evaliation of  BP and general medical  condition  F/U surgical evaluation. May consider pending follow-up evaluations  Ask the nurses and secretary how do you get your TENS unit and physical therapy  F/U neurological evaluation. May consider pending follow-up evaluations  May consider radiofrequency rhizolysis or intraspinal procedures pending response to present treatment and F/U evaluation   Patient to call Pain Management Center should patient have concerns prior to scheduled return appointment.Pain Management Discharge Instructions  General Discharge Instructions :  If you need to reach your doctor call: Monday-Friday 8:00 am - 4:00 pm at [phone] or toll free [phone].  After clinic hours [phone] to have operator reach doctor.  Bring all of your medication bottles to all your appointments in the pain clinic.  To cancel or reschedule your appointment with Pain Management please remember to call 24 hours in advance to avoid a fee.  Refer to the educational materials which you have been given on: General Risks, I had my Procedure. Discharge Instructions, Post Sedation.  Post Procedure Instructions:  The drugs you were given will stay in your system until tomorrow, so for the next 24 hours you should not drive, make any legal decisions or drink any alcoholic beverages.  You may eat anything you prefer, but it is better to start with liquids then soups and crackers, and gradually work up to solid foods.  Please notify your doctor immediately if you have any unusual bleeding, trouble breathing or pain that is not related to your normal pain.  Depending on the type of procedure that was done, some parts of your body may feel week and/or numb.  This usually clears up by  tonight or the next day.  Walk with the use of an assistive device or accompanied by an adult for the 24 hours.  You may use ice on the affected area for the first 24 hours.  Put ice in a Ziploc bag and cover with a towel and place against area 15 minutes on 15 minutes off.  You may switch to heat after 24 hours.

## 2016-06-03 ENCOUNTER — Telehealth: Payer: Self-pay | Admitting: *Deleted

## 2016-06-03 NOTE — Telephone Encounter (Signed)
Voicemail left for patient to call our office if there are questions or concerns re; procedure.  

## 2016-06-16 ENCOUNTER — Encounter: Payer: Self-pay | Admitting: *Deleted

## 2016-06-17 ENCOUNTER — Encounter: Payer: Self-pay | Admitting: *Deleted

## 2016-06-19 ENCOUNTER — Encounter: Payer: Self-pay | Admitting: Pain Medicine

## 2016-06-19 ENCOUNTER — Ambulatory Visit: Payer: Medicare Other | Attending: Pain Medicine | Admitting: Pain Medicine

## 2016-06-19 VITALS — BP 150/75 | HR 81 | Temp 98.5°F | Resp 16 | Ht 65.0 in | Wt 174.0 lb

## 2016-06-19 DIAGNOSIS — M545 Low back pain: Secondary | ICD-10-CM | POA: Diagnosis present

## 2016-06-19 DIAGNOSIS — E1140 Type 2 diabetes mellitus with diabetic neuropathy, unspecified: Secondary | ICD-10-CM | POA: Insufficient documentation

## 2016-06-19 DIAGNOSIS — M1990 Unspecified osteoarthritis, unspecified site: Secondary | ICD-10-CM | POA: Diagnosis not present

## 2016-06-19 DIAGNOSIS — E0841 Diabetes mellitus due to underlying condition with diabetic mononeuropathy: Secondary | ICD-10-CM

## 2016-06-19 DIAGNOSIS — E0849 Diabetes mellitus due to underlying condition with other diabetic neurological complication: Secondary | ICD-10-CM

## 2016-06-19 DIAGNOSIS — M5136 Other intervertebral disc degeneration, lumbar region: Secondary | ICD-10-CM

## 2016-06-19 DIAGNOSIS — M79606 Pain in leg, unspecified: Secondary | ICD-10-CM | POA: Diagnosis present

## 2016-06-19 MED ORDER — DULOXETINE HCL 30 MG PO CPEP: ORAL_CAPSULE | ORAL | Status: DC

## 2016-06-19 MED ORDER — TRAZODONE HCL 50 MG PO TABS: ORAL_TABLET | ORAL | Status: DC

## 2016-06-19 MED ORDER — GABAPENTIN 300 MG PO CAPS: ORAL_CAPSULE | ORAL | Status: DC

## 2016-06-19 NOTE — Patient Instructions (Addendum)
PLAN   Continue present medications Neurontin Cymbalta and trazodone  Continue Lyrica as per Dr. Rosario Jacks as discussed Patient advised to avoid Naprosyn and nonsteroidal anti-inflammatory medications. May consider amitriptyline and other medications as discussed Take vitamin B complex as well as discussed  Lumbosacral selective nerve root block to be performed at time of return appointment  F/U PCP Dr. Kary Kos for evaliation of  BP and general medical  condition  F/U surgical evaluation. May consider pending follow-up evaluations  F/U neurological evaluation. May consider PNCV/EMG studies and other studies pending follow-up evaluations  May consider radiofrequency rhizolysis or intraspinal procedures pending response to present treatment and F/U evaluation   Patient to call Pain Management Center should patient have concerns prior to scheduled return appointment. Selective Nerve Root Block Patient Information  Description: Specific nerve roots exit the spinal canal and these nerves can be compressed and inflamed by a bulging disc and bone spurs.  By injecting steroids on the nerve root, we can potentially decrease the inflammation surrounding these nerves, which often leads to decreased pain.  Also, by injecting local anesthesia on the nerve root, this can provide [place] helpful information to give to your referring doctor if it decreases your pain.  Selective nerve root blocks can be done along the spine from the neck to the low back depending on the location of your pain.   After numbing the skin with local anesthesia, a small needle is passed to the nerve root and the position of the needle is verified using x-ray pictures.  After the needle is in correct position, we then deposit the medication.  You may experience a pressure sensation while this is being done.  The entire block usually lasts less than 15 minutes.  Conditions that may be treated with selective nerve root blocks:  Low back and  leg pain  Spinal stenosis  Diagnostic block prior to potential surgery  Neck and arm pain  Post laminectomy syndrome  Preparation for the injection:  1. Do not eat any solid food or dairy products within 8 hours of your appointment. 2. You may drink clear liquids up to 3 hours before an appointment.  Clear liquids include water, black coffee, juice or soda.  No milk or cream please. 3. You may take your regular medications, including pain medications, with a sip of water before your appointment.  Diabetics should hold regular insulin (if taken separately) and take 1/2 normal NPH dose the morning of the procedure.  Carry some sugar containing items with you to your appointment. 4. A driver must accompany you and be prepared to drive you home after your procedure. 5. Bring all your current medications with you. 6. An IV may be inserted and sedation may be given at the discretion of the physician. 7. A blood pressure cuff, EKG, and other monitors will often be applied during the procedure.  Some patients may need to have extra oxygen administered for a short period. 8. You will be asked to provide medical information, including allergies, prior to the procedure.  We must know immediately if you are taking blood  Thinners (like Coumadin) or if you are allergic to IV iodine contrast (dye).  Possible side-effects: All are usually temporary  Bleeding from needle site  Light headedness  Numbness and tingling  Decreased blood pressure  Weakness in arms/legs  Pressure sensation in back/neck  Pain at injection site (several days)  Possible complications: All are extremely rare  Infection  Nerve injury  Spinal headache (a  headache wore with upright position)  Call if you experience:  Fever/chills associated with headache or increased back/neck pain  Headache worsened by an upright position  New onset weakness or numbness of an extremity below the injection site  Hives or  difficulty breathing (go to the emergency room)  Inflammation or drainage at the injection site(s)  Severe back/neck pain greater than usual  New symptoms which are concerning to you  Please note:  Although the local anesthetic injected can often make your back or neck feel good for several hours after the injection the pain will likely return.  It takes 3-5 days for steroids to work on the nerve root. You may not notice any pain relief for at least one week.  If effective, we will often do a series of 3 injections spaced 3-6 weeks apart to maximally decrease your pain.    If you have any questions, please call [phone] [facility]

## 2016-06-19 NOTE — Progress Notes (Signed)
Safety precautions to be maintained throughout the outpatient stay will include: orient to surroundings, keep bed in low position, maintain call bell within reach at all times, provide assistance with transfer out of bed and ambulation.  

## 2016-06-19 NOTE — Progress Notes (Signed)
   Subjective:    Patient ID: Laura [name], female    DOB: 12/03/1951, 65 y.o.   MRN: [id number]  HPI  Patient is a 65 year old female who returns to pain management for further evaluation and treatment of pain involving the lower back lower extremity region and hands. The patient's pain has been felt to be due to diabetes mellitus with diabetic neuropathy. The patient continues medications consisting of Cymbalta Neurontin and recently has been prescribed Lyrica by Dr. Rosario Jacks . We discussed patient's condition and patient continues to be with significant burning stinging pain involving the lower extremity region especially. We will proceed with interventional treatment at time return appointment in attempt to decrease severity of patient's symptoms of the lower extremity, with retard progression of patient's symptoms, and avoid the need for more involved treatment. All agreed to suggested treatment plan  Review of Systems     Objective:   Physical Exam   There was tenderness of the splenius capitis and occipitalis region palpation which reproduces pain of minimal degree with minimal tenderness over the trapezius levator scapula and rhomboid musculature region of minimal tenderness of the acromioclavicular and glenohumeral joint region. The patient was with unremarkable drop test and Spurling's maneuver was unremarkable. The patient appeared to be with slightly decreased grip strength with mild increase of pain with Tinel and Phalen's maneuver. There was tenderness over the thoracic region without crepitus of the thoracic region noted. Palpation over the lumbar region was with tenderness to palpation with lateral bending rotation extension and palpation of the lumbar facets reproducing mild to moderate discomfort. There was questionably decreased sensation of the lower extremities in a stocking-type distribution with no sensory deficit of dermatomal distribution detected. There was tenderness over the  PSIS and PII S regions. There was negative clonus negative Homans. Abdomen nontender with no costovertebral angle tenderness noted     Assessment & Plan:      Diabetes mellitus with diabetic neuropathy  Osteoarthritis     PLAN   Continue present medications Neurontin Cymbalta and trazodone  Continue Lyrica as per Dr. Rosario Jacks as discussed Patient advised to avoid Naprosyn and nonsteroidal anti-inflammatory medications. May consider amitriptyline and other medications as discussed Take vitamin B complex as well as discussed  Lumbosacral selective nerve root block to be performed at time of return appointment  F/U PCP Dr. Kary Kos for evaliation of  BP and general medical  condition  F/U surgical evaluation. May consider pending follow-up evaluations  F/U neurological evaluation. May consider PNCV/EMG studies and other studies pending follow-up evaluations  May consider radiofrequency rhizolysis or intraspinal procedures pending response to present treatment and F/U evaluation   Patient to call Pain Management Center should patient have concerns prior to scheduled return appointment.

## 2016-06-23 ENCOUNTER — Telehealth: Payer: Self-pay

## 2016-06-23 NOTE — Telephone Encounter (Signed)
Pt wanting to know about her gabapentin- she has prescription at pharm- instructed her to take as prescribed

## 2016-06-23 NOTE — Telephone Encounter (Signed)
Pt has a few questions concerning her medications. Please give her a call

## 2016-06-24 ENCOUNTER — Encounter: Payer: Self-pay | Admitting: General Surgery

## 2016-06-24 ENCOUNTER — Ambulatory Visit (INDEPENDENT_AMBULATORY_CARE_PROVIDER_SITE_OTHER): Payer: Medicare Other | Admitting: General Surgery

## 2016-06-24 VITALS — BP 132/74 | HR 74 | Resp 12 | Ht 66.0 in | Wt 179.0 lb

## 2016-06-24 DIAGNOSIS — D1720 Benign lipomatous neoplasm of skin and subcutaneous tissue of unspecified limb: Secondary | ICD-10-CM | POA: Diagnosis not present

## 2016-06-24 NOTE — Progress Notes (Signed)
Patient ID: Laura [name], female   DOB: [birth year], 65 y.o.   MRN: [id number]  Chief Complaint  Patient presents with  . Lipoma    Right Arm    HPI Laura [name] is a 65 y.o. female here today for a evaluation of a lipoma on the lateral aspect of her upper right arm, immediately above her right elbow. Patient states she noticed this about five years ago. The area has grown over time. For the most part it doesn't bother her. It is not painful unless she hits it on something. No other similar lesions exist elsewhere. I have reviewed the history of present illness with the patient.  HPI  Past Medical History  Diagnosis Date  . HTN (hypertension)   . Chronic cough 07/01/10    PFT  FEV1 2.20 (93%), FEV 1% 81, TLC 4,12 (81%), DLCO 78%, no BD. normal chest CT, sinus 07/08/10  . Anxiety   . Depression   . Chronic back pain   . GERD (gastroesophageal reflux disease)   . Diverticulosis   . Hemorrhoids   . Osteoarthritis   . Diabetes mellitus without complication ([place])   . Allergy   . Diabetic neuropathy ([place])   . Asthma     Past Surgical History  Procedure Laterality Date  . Partial hysterectomy    . Carpal tunnel release    . Foot surgery    . Cathater ablation      for arrhythmia  . Bronchoscopy  07/25/10  . Cataract extraction Bilateral Jan 2017  . Toenail excision Right   . Colonoscopy  2014    Family History  Problem Relation Age of Onset  . Cancer Mother     stomach  . Cancer Father     brain  . Heart disease Father   . Cancer Brother     mouth  . Heart disease Brother   . Ovarian cancer Maternal Grandmother     Social History Social History  Substance Use Topics  . Smoking status: Never Smoker   . Smokeless tobacco: None  . Alcohol Use: No    Allergies  Allergen Reactions  . Atenolol Palpitations  . Clarithromycin Other (See Comments) and Nausea And Vomiting    Makes the tongue raw REACTION: nausea  . Doxycycline Palpitations    Makes her heart beat  fast Other reaction(s): GI Upset (intolerance) Loose bowels REACTION: loose stool  . Sulfa Antibiotics Nausea Only  . Erythromycin Base Nausea And Vomiting  . Sulfamethoxazole Other (See Comments)    Causes kidney infection  . Sulfur     REACTION: burning \\T \ rash  . Penicillins Rash    REACTION: rash REACTION: rash    Current Outpatient Prescriptions  Medication Sig Dispense Refill  . albuterol (PROVENTIL) (5 MG/ML) 0.5% nebulizer solution Take 2.5 mg by nebulization every 6 (six) hours as needed. Reported on 06/02/2016    . amLODipine (NORVASC) 5 MG tablet Take 5 mg by mouth daily. Reported on 06/19/2016    . citalopram (CELEXA) 40 MG tablet Take 40 mg by mouth. Reported on 06/02/2016    . dexlansoprazole (DEXILANT) 60 MG capsule Take 60 mg by mouth daily. Reported on 06/02/2016    . gabapentin (NEURONTIN) 300 MG capsule Limit 1-2 tabs by mouth 2-3 times per day if tolerated 180 capsule 0  . glipiZIDE (GLUCOTROL XL) 5 MG 24 hr tablet Take 5 mg by mouth 2 (two) times daily.     Marland Kitchen lisinopril (PRINIVIL,ZESTRIL) 10 MG tablet Reported  on 06/02/2016    . metFORMIN (GLUCOPHAGE) 500 MG tablet Take 500 mg by mouth 2 (two) times daily with a meal. Reported on 06/02/2016    . pregabalin (LYRICA) 50 MG capsule Take 50 mg by mouth 2 (two) times daily.    . traZODone (DESYREL) 50 MG tablet Limit 1 tablet by mouth at bedtime when necessary for insomnia if tolerated 30 tablet 0  . DULoxetine (CYMBALTA) 30 MG capsule 60 mg daily. Reported on 03/12/2016     Current Facility-Administered Medications  Medication Dose Route Frequency Provider Last Rate Last Dose  . bupivacaine (PF) (MARCAINE) 0.25 % injection 30 mL  30 mL Other Once Mohammed Kindle, MD      . fentaNYL (SUBLIMAZE) injection 100 mcg  100 mcg Intravenous Once Mohammed Kindle, MD      . lactated ringers infusion 1,000 mL  1,000 mL Intravenous Continuous Mohammed Kindle, MD      . lactated ringers infusion 1,000 mL  1,000 mL Intravenous Continuous  Mohammed Kindle, MD 125 mL/hr at 06/02/16 0837 1,000 mL at 06/02/16 0837  . lidocaine (PF) (XYLOCAINE) 1 % injection 10 mL  10 mL Subcutaneous Once Mohammed Kindle, MD      . midazolam (VERSED) 5 MG/5ML injection 5 mg  5 mg Intravenous Once Mohammed Kindle, MD      . orphenadrine (NORFLEX) injection 60 mg  60 mg Intramuscular Once Mohammed Kindle, MD        Review of Systems Review of Systems  Constitutional: Negative.   Respiratory: Negative.   Cardiovascular: Negative.     Blood pressure 132/74, pulse 74, resp. rate 12, height 5\' 6"  (1.676 m), weight 179 lb (81.194 kg).  Physical Exam Physical Exam  Constitutional: She is oriented to person, place, and time. She appears well-developed and well-nourished.  Eyes: Conjunctivae are normal. No scleral icterus.  Neck: Neck supple.  Cardiovascular: Normal rate, regular rhythm and normal heart sounds.   Pulmonary/Chest: Effort normal and breath sounds normal.  Lymphadenopathy:    She has no cervical adenopathy.  Neurological: She is alert and oriented to person, place, and time.  Skin: Skin is warm and dry.       Data Reviewed Notes reviewed  Assessment    Lipoma right arm. Confirmed with ultrasound assessment.    Plan   Recommended excision in SDS with sedation and local anesthesia  Patient was educated on the slight increased risk of cancer associated with a lipoma this size. She was advised of the benefits and risks to a lipoma excision. Patient is not interested in an excision at this time. She was encouraged to consider it and to call our office should she decide she wants the lesion removed   PCP:  [place] [facility] This information has been scribed by Gaspar Cola CMA.    SANKAR,SEEPLAPUTHUR G 06/24/2016, 3:00 PM

## 2016-06-24 NOTE — Patient Instructions (Signed)

## 2016-07-02 ENCOUNTER — Ambulatory Visit: Payer: Medicare Other | Attending: Pain Medicine | Admitting: Pain Medicine

## 2016-07-02 ENCOUNTER — Encounter: Payer: Self-pay | Admitting: Pain Medicine

## 2016-07-02 VITALS — BP 122/62 | HR 81 | Temp 97.7°F | Resp 18 | Ht 65.0 in | Wt 174.0 lb

## 2016-07-02 DIAGNOSIS — M47816 Spondylosis without myelopathy or radiculopathy, lumbar region: Secondary | ICD-10-CM

## 2016-07-02 DIAGNOSIS — M545 Low back pain: Secondary | ICD-10-CM | POA: Insufficient documentation

## 2016-07-02 DIAGNOSIS — M79606 Pain in leg, unspecified: Secondary | ICD-10-CM | POA: Insufficient documentation

## 2016-07-02 DIAGNOSIS — M1990 Unspecified osteoarthritis, unspecified site: Secondary | ICD-10-CM

## 2016-07-02 DIAGNOSIS — R202 Paresthesia of skin: Secondary | ICD-10-CM | POA: Insufficient documentation

## 2016-07-02 DIAGNOSIS — E0849 Diabetes mellitus due to underlying condition with other diabetic neurological complication: Secondary | ICD-10-CM

## 2016-07-02 DIAGNOSIS — M5136 Other intervertebral disc degeneration, lumbar region: Secondary | ICD-10-CM | POA: Insufficient documentation

## 2016-07-02 DIAGNOSIS — E1140 Type 2 diabetes mellitus with diabetic neuropathy, unspecified: Secondary | ICD-10-CM

## 2016-07-02 DIAGNOSIS — E0841 Diabetes mellitus due to underlying condition with diabetic mononeuropathy: Secondary | ICD-10-CM

## 2016-07-02 MED ORDER — TRIAMCINOLONE ACETONIDE 40 MG/ML IJ SUSP
40.0000 mg | Freq: Once | INTRAMUSCULAR | Status: AC
  Administered 2016-07-02: 40 mg
  Filled 2016-07-02: qty 1

## 2016-07-02 MED ORDER — LIDOCAINE HCL (PF) 1 % IJ SOLN
10.0000 mL | Freq: Once | INTRAMUSCULAR | Status: DC
  Filled 2016-07-02: qty 10

## 2016-07-02 MED ORDER — LACTATED RINGERS IV SOLN: 1000.0000 mL | INTRAVENOUS | Status: DC

## 2016-07-02 MED ORDER — AZITHROMYCIN 250 MG PO TABS: ORAL_TABLET | ORAL | 0 refills | Status: DC

## 2016-07-02 MED ORDER — FENTANYL CITRATE (PF) 100 MCG/2ML IJ SOLN
100.0000 ug | Freq: Once | INTRAMUSCULAR | Status: AC
  Administered 2016-07-02: 50 ug via INTRAVENOUS
  Filled 2016-07-02: qty 2

## 2016-07-02 MED ORDER — DULOXETINE HCL 30 MG PO CPEP: ORAL_CAPSULE | ORAL | 0 refills | Status: DC

## 2016-07-02 MED ORDER — ORPHENADRINE CITRATE 30 MG/ML IJ SOLN
60.0000 mg | Freq: Once | INTRAMUSCULAR | Status: DC
  Filled 2016-07-02: qty 2

## 2016-07-02 MED ORDER — BUPIVACAINE HCL (PF) 0.25 % IJ SOLN
30.0000 mL | Freq: Once | INTRAMUSCULAR | Status: AC
  Administered 2016-07-02: 30 mL
  Filled 2016-07-02: qty 30

## 2016-07-02 MED ORDER — MIDAZOLAM HCL 5 MG/5ML IJ SOLN
5.0000 mg | Freq: Once | INTRAMUSCULAR | Status: AC
Start: 2016-07-02 — End: 2016-07-02
  Administered 2016-07-02: 3 mg via INTRAVENOUS
  Filled 2016-07-02: qty 5

## 2016-07-02 NOTE — Progress Notes (Signed)
     PROCEDURE PERFORMED: Lumbosacral selective nerve root block   NOTE: The patient is a 65 y.o. female who returns to [facility] for further evaluation and treatment of pain involving the lumbar and lower extremity region. There is concern regarding diabetic neuropathy being a significant component contributing to the patient's symptomatology of burning stinging throbbing paresthesias of the lower extremities. The risks, benefits, and expectations of the procedure have been explained to the patient who was understanding and in agreement with suggested treatment plan. We will proceed with interventional treatment as discussed and as explained to the patient. The patient is understanding and in agreement with suggested treatment plan.   DESCRIPTION OF PROCEDURE: Lumbosacral selective nerve root block with IV Versed, IV fentanyl conscious sedation, EKG, blood pressure, pulse, capnography, and pulse oximetry monitoring. The procedure was performed with the patient in the prone position under fluoroscopic guidance. With the patient in the prone position, Betadine prep of proposed entry site was performed. Local anesthetic skin wheal of proposed needle entry site was prepared with 1.5% plain lidocaine with AP view of the lumbosacral spine.   PROCEDURE #1: Needle placement at the right L 2 vertebral body: A 22 -gauge needle was inserted at the inferior border of the transverse process of the vertebral body with needle placed medial to the midline of the transverse process on AP view of the lumbosacral spine.   NEEDLE PLACEMENT AT  L3, L4, and L5  VERTEBRAL BODY LEVELS  Needle  placement was accomplished at L3, L4, and L5  vertebral body levels on the right side exactly as was accomplished at the L2  vertebral body level  and utilizing the same technique and under fluoroscopic guidance.   Needle placement was then verified on lateral view at all levels with needle tip documented to be in the  posterior superior quadrant of the intervertebral foramen of  L 2, L3, L4, and L5. Following negative aspiration for heme and CSF at each level, each level was injected with 3 mL of 0.25% bupivacaine with Kenalog.   The patient tolerated the procedure well. A total of 10 mg of Kenalog was utilized for the procedure.   PLAN:  1. Medications: Will continue presently prescribed medications Neurontin Cymbalta and Klonopin. 2. The patient is to undergo follow-up evaluation with PCP Dr. Kary Kos for evaluation of blood pressure and general medical condition status post procedure performed on today's visit. 3. Surgical follow-up evaluation. Has been addressed 4. Neurological evaluation. May consider PNCV EMG studies and other studies 5. May consider radiofrequency procedures, implantation type procedures and other treatment pending response to treatment and follow-up evaluation. 6. The patient has been advised do adhere to proper body mechanics and avoid activities which may aggravate condition. 7. The patient has been advised to call the Pain Management Center prior to scheduled return appointment should there be significant change in the patient's condition or should the patient have other concerns regarding condition prior to scheduled return appointment.

## 2016-07-02 NOTE — Patient Instructions (Addendum)
PLAN   Continue present medications Neurontin Cymbalta Klonopin Patient no longer taking tramadol tramadol.   Please begin antibiotic azithromycin(Z-Pak)  today as prescribed  F/U PCP Dr. Kary Kos for evaliation of  BP and general medical  condition  F/U surgical evaluation. May consider pending follow-up evaluations  TENS unit and physical therapy as discussed  F/U neurological evaluation. May consider pending follow-up evaluations  May consider radiofrequency rhizolysis or intraspinal procedures pending response to present treatment and F/U evaluation   Patient to call Pain Management Center should patient have concerns prior to scheduled return appointment.Pain Management Pain Management Discharge Instructions  General Discharge Instructions :  If you need to reach your doctor call: Monday-Friday 8:00 am - 4:00 pm at [phone] or toll free [phone].  After clinic hours [phone] to have operator reach doctor.  Bring all of your medication bottles to all your appointments in the pain clinic.  To cancel or reschedule your appointment with Pain Management please remember to call 24 hours in advance to avoid a fee.  Refer to the educational materials which you have been given on: General Risks, I had my Procedure. Discharge Instructions, Post Sedation.  Post Procedure Instructions:  The drugs you were given will stay in your system until tomorrow, so for the next 24 hours you should not drive, make any legal decisions or drink any alcoholic beverages.  You may eat anything you prefer, but it is better to start with liquids then soups and crackers, and gradually work up to solid foods.  Please notify your doctor immediately if you have any unusual bleeding, trouble breathing or pain that is not related to your normal pain.  Depending on the type of procedure that was done, some parts of your body may feel week and/or numb.  This usually clears up by tonight or the next  day.  Walk with the use of an assistive device or accompanied by an adult for the 24 hours.  You may use ice on the affected area for the first 24 hours.  Put ice in a Ziploc bag and cover with a towel and place against area 15 minutes on 15 minutes off.  You may switch to heat after 24 hours.GENERAL RISKS AND COMPLICATIONS  What are the risk, side effects and possible complications? Generally speaking, most procedures are safe.  However, with any procedure there are risks, side effects, and the possibility of complications.  The risks and complications are dependent upon the sites that are lesioned, or the type of nerve block to be performed.  The closer the procedure is to the spine, the more serious the risks are.  Great care is taken when placing the radio frequency needles, block needles or lesioning probes, but sometimes complications can occur. 1. Infection: Any time there is an injection through the skin, there is a risk of infection.  This is why sterile conditions are used for these blocks.  There are four possible types of infection. 1. Localized skin infection. 2. Central Nervous System Infection-This can be in the form of Meningitis, which can be deadly. 3. Epidural Infections-This can be in the form of an epidural abscess, which can cause pressure inside of the spine, causing compression of the spinal cord with subsequent paralysis. This would require an emergency surgery to decompress, and there are no guarantees that the patient would recover from the paralysis. 4. Discitis-This is an infection of the intervertebral discs.  It occurs in about 1% of discography procedures.  It is difficult  to treat and it may lead to surgery.        2. Pain: the needles have to go through skin and soft tissues, will cause soreness.       3. Damage to internal structures:  The nerves to be lesioned may be near blood vessels or    other nerves which can be potentially damaged.       4. Bleeding:  Bleeding is more common if the patient is taking blood thinners such as  aspirin, Coumadin, Ticiid, Plavix, etc., or if he/she have some genetic predisposition  such as hemophilia. Bleeding into the spinal canal can cause compression of the spinal  cord with subsequent paralysis.  This would require an emergency surgery to  decompress and there are no guarantees that the patient would recover from the  paralysis.       5. Pneumothorax:  Puncturing of a lung is a possibility, every time a needle is introduced in  the area of the chest or upper back.  Pneumothorax refers to free air around the  collapsed lung(s), inside of the thoracic cavity (chest cavity).  Another two possible  complications related to a similar event would include: Hemothorax and Chylothorax.   These are variations of the Pneumothorax, where instead of air around the collapsed  lung(s), you may have blood or chyle, respectively.       6. Spinal headaches: They may occur with any procedures in the area of the spine.       7. Persistent CSF (Cerebro-Spinal Fluid) leakage: This is a rare problem, but may occur  with prolonged intrathecal or epidural catheters either due to the formation of a fistulous  track or a dural tear.       8. Nerve damage: By working so close to the spinal cord, there is always a possibility of  nerve damage, which could be as serious as a permanent spinal cord injury with  paralysis.       9. Death:  Although rare, severe deadly allergic reactions known as "Anaphylactic  reaction" can occur to any of the medications used.      10. Worsening of the symptoms:  We can always make thing worse.  What are the chances of something like this happening? Chances of any of this occuring are extremely low.  By statistics, you have more of a chance of getting killed in a motor vehicle accident: while driving to the hospital than any of the above occurring .  Nevertheless, you should be aware that they are possibilities.  In  general, it is similar to taking a shower.  Everybody knows that you can slip, hit your head and get killed.  Does that mean that you should not shower again?  Nevertheless always keep in mind that statistics do not mean anything if you happen to be on the wrong side of them.  Even if a procedure has a 1 (one) in a 1,000,000 (million) chance of going wrong, it you happen to be that one..Also, keep in mind that by statistics, you have more of a chance of having something go wrong when taking medications.  Who should not have this procedure? If you are on a blood thinning medication (e.g. Coumadin, Plavix, see list of "Blood Thinners"), or if you have an active infection going on, you should not have the procedure.  If you are taking any blood thinners, please inform your physician.  How should I prepare for this procedure?  Do not eat  or drink anything at least six hours prior to the procedure.  Bring a driver with you .  It cannot be a taxi.  Come accompanied by an adult that can drive you back, and that is strong enough to help you if your legs get weak or numb from the local anesthetic.  Take all of your medicines the morning of the procedure with just enough water to swallow them.  If you have diabetes, make sure that you are scheduled to have your procedure done first thing in the morning, whenever possible.  If you have diabetes, take only half of your insulin dose and notify our nurse that you have done so as soon as you arrive at the clinic.  If you are diabetic, but only take blood sugar pills (oral hypoglycemic), then do not take them on the morning of your procedure.  You may take them after you have had the procedure.  Do not take aspirin or any aspirin-containing medications, at least eleven (11) days prior to the procedure.  They may prolong bleeding.  Wear loose fitting clothing that may be easy to take off and that you would not mind if it got stained with Betadine or  blood.  Do not wear any jewelry or perfume  Remove any nail coloring.  It will interfere with some of our monitoring equipment.  NOTE: Remember that this is not meant to be interpreted as a complete list of all possible complications.  Unforeseen problems may occur.  BLOOD THINNERS The following drugs contain aspirin or other products, which can cause increased bleeding during surgery and should not be taken for 2 weeks prior to and 1 week after surgery.  If you should need take something for relief of minor pain, you may take acetaminophen which is found in Tylenol,m Datril, Anacin-3 and Panadol. It is not blood thinner. The products listed below are.  Do not take any of the products listed below in addition to any listed on your instruction sheet.  A.P.C or A.P.C with Codeine Codeine Phosphate Capsules #3 Ibuprofen Ridaura  ABC compound Congesprin Imuran rimadil  Advil Cope Indocin Robaxisal  Alka-Seltzer Effervescent Pain Reliever and Antacid Coricidin or Coricidin-D  Indomethacin Rufen  Alka-Seltzer plus Cold Medicine Cosprin Ketoprofen S-A-C Tablets  Anacin Analgesic Tablets or Capsules Coumadin Korlgesic Salflex  Anacin Extra Strength Analgesic tablets or capsules CP-2 Tablets Lanoril Salicylate  Anaprox Cuprimine Capsules Levenox Salocol  Anexsia-D Dalteparin Magan Salsalate  Anodynos Darvon compound Magnesium Salicylate Sine-off  Ansaid Dasin Capsules Magsal Sodium Salicylate  Anturane Depen Capsules Marnal Soma  APF Arthritis pain formula Dewitt's Pills Measurin Stanback  Argesic Dia-Gesic Meclofenamic Sulfinpyrazone  Arthritis Bayer Timed Release Aspirin Diclofenac Meclomen Sulindac  Arthritis pain formula Anacin Dicumarol Medipren Supac  Analgesic (Safety coated) Arthralgen Diffunasal Mefanamic Suprofen  Arthritis Strength Bufferin Dihydrocodeine Mepro Compound Suprol  Arthropan liquid Dopirydamole Methcarbomol with Aspirin Synalgos  ASA tablets/Enseals Disalcid Micrainin  Tagament  Ascriptin Doan's Midol Talwin  Ascriptin A/D Dolene Mobidin Tanderil  Ascriptin Extra Strength Dolobid Moblgesic Ticlid  Ascriptin with Codeine Doloprin or Doloprin with Codeine Momentum Tolectin  Asperbuf Duoprin Mono-gesic Trendar  Aspergum Duradyne Motrin or Motrin IB Triminicin  Aspirin plain, buffered or enteric coated Durasal Myochrisine Trigesic  Aspirin Suppositories Easprin Nalfon Trillsate  Aspirin with Codeine Ecotrin Regular or Extra Strength Naprosyn Uracel  Atromid-S Efficin Naproxen Ursinus  Auranofin Capsules Elmiron Neocylate Vanquish  Axotal Emagrin Norgesic Verin  Azathioprine Empirin or Empirin with Codeine Normiflo Vitamin E  Azolid Emprazil  Nuprin Voltaren  Bayer Aspirin plain, buffered or children's or timed BC Tablets or powders Encaprin Orgaran Warfarin Sodium  Buff-a-Comp Enoxaparin Orudis Zorpin  Buff-a-Comp with Codeine Equegesic Os-Cal-Gesic   Buffaprin Excedrin plain, buffered or Extra Strength Oxalid   Bufferin Arthritis Strength Feldene Oxphenbutazone   Bufferin plain or Extra Strength Feldene Capsules Oxycodone with Aspirin   Bufferin with Codeine Fenoprofen Fenoprofen Pabalate or Pabalate-SF   Buffets II Flogesic Panagesic   Buffinol plain or Extra Strength Florinal or Florinal with Codeine Panwarfarin   Buf-Tabs Flurbiprofen Penicillamine   Butalbital Compound Four-way cold tablets Penicillin   Butazolidin Fragmin Pepto-Bismol   Carbenicillin Geminisyn Percodan   Carna Arthritis Reliever Geopen Persantine   Carprofen Gold's salt Persistin   Chloramphenicol Goody's Phenylbutazone   Chloromycetin Haltrain Piroxlcam   Clmetidine heparin Plaquenil   Cllnoril Hyco-pap Ponstel   Clofibrate Hydroxy chloroquine Propoxyphen         Before stopping any of these medications, be sure to consult the physician who ordered them.  Some, such as Coumadin (Warfarin) are ordered to prevent or treat serious conditions such as "deep thrombosis", "pumonary  embolisms", and other heart problems.  The amount of time that you may need off of the medication may also vary with the medication and the reason for which you were taking it.  If you are taking any of these medications, please make sure you notify your pain physician before you undergo any procedures.

## 2016-07-02 NOTE — Progress Notes (Signed)
Patient here today for procedure visit.  Patient would like refill Cymbalta that was prescribed Dr Melrose Nakayama.  Permission to prescribe received from Dr Melrose Nakayama.  Safety precautions to be maintained throughout the outpatient stay will include: orient to surroundings, keep bed in low position, maintain call bell within reach at all times, provide assistance with transfer out of bed and ambulation.

## 2016-07-03 ENCOUNTER — Telehealth: Payer: Self-pay | Admitting: *Deleted

## 2016-07-03 NOTE — Telephone Encounter (Signed)
Voicemail left with patient to call our office if there are questions or concerns re; procedure on yesterday.  

## 2016-07-21 ENCOUNTER — Ambulatory Visit: Payer: Medicare Other | Attending: Pain Medicine | Admitting: Pain Medicine

## 2016-07-21 ENCOUNTER — Other Ambulatory Visit: Payer: Self-pay | Admitting: Pain Medicine

## 2016-07-21 ENCOUNTER — Encounter: Payer: Self-pay | Admitting: Pain Medicine

## 2016-07-21 VITALS — BP 126/70 | HR 87 | Temp 98.6°F | Resp 16 | Ht 65.0 in | Wt 180.0 lb

## 2016-07-21 DIAGNOSIS — E1140 Type 2 diabetes mellitus with diabetic neuropathy, unspecified: Secondary | ICD-10-CM | POA: Diagnosis not present

## 2016-07-21 DIAGNOSIS — M79605 Pain in left leg: Secondary | ICD-10-CM | POA: Diagnosis present

## 2016-07-21 DIAGNOSIS — M5136 Other intervertebral disc degeneration, lumbar region: Secondary | ICD-10-CM

## 2016-07-21 DIAGNOSIS — M5416 Radiculopathy, lumbar region: Secondary | ICD-10-CM | POA: Insufficient documentation

## 2016-07-21 DIAGNOSIS — IMO0002 Reserved for concepts with insufficient information to code with codable children: Secondary | ICD-10-CM | POA: Insufficient documentation

## 2016-07-21 DIAGNOSIS — M1990 Unspecified osteoarthritis, unspecified site: Secondary | ICD-10-CM | POA: Diagnosis not present

## 2016-07-21 DIAGNOSIS — M79604 Pain in right leg: Secondary | ICD-10-CM | POA: Diagnosis present

## 2016-07-21 MED ORDER — GABAPENTIN 300 MG PO CAPS: ORAL_CAPSULE | ORAL | 0 refills | Status: DC

## 2016-07-21 MED ORDER — TRAZODONE HCL 50 MG PO TABS: ORAL_TABLET | ORAL | 0 refills | Status: DC

## 2016-07-21 NOTE — Progress Notes (Signed)
Safety precautions to be maintained throughout the outpatient stay will include: orient to surroundings, keep bed in low position, maintain call bell within reach at all times, provide assistance with transfer out of bed and ambulation.  

## 2016-07-21 NOTE — Progress Notes (Signed)
    The patient is a 65 year old female who returns to pain management for further evaluation and treatment of pain involving the lower extremities and upper extremities. The patient's pain of been felt to be due to diabetic neuropathy. The patient has had significant improvement of pain involving the lower extremities. At the present time we will continue presently prescribed medications consisting of Neurontin Cymbalta and trazodone. We will consider modifications of treatment regimen as well as interventional treatment pending follow-up evaluation. The patient denies any trauma change in events of daily living because no change in symptomatology. The patient states she no significant improvement of her condition since undergoing treatment in pain management continue present treatment regimen at this time and will consider modification of treatment regimen pending follow-up evaluation. All agreed to suggested treatment plan.    Physical examination  There was tends to palpation of the splenius capitis and occipitalis region palpation which reproduces mild discomfort. There was mild tenderness of the acromioclavicular and glenohumeral joint region and patient appeared to be with slightly decreased grip strength with mild increased pain with Tinel and Phalen's maneuver. There was questionably decreased sensation of the upper extremities.. The patient appeared to be with unremarkable Spurling's maneuver. The patient was able to perform drop test with mild difficulty Palpation over the thoracic region was attends to palpation with no crepitus of the thoracic region noted..  Palpation of the lumbar region was attends to palpation of moderate degree with lateral bending rotation extension and palpation of the lumbar facets reproducing moderate discomfort. There appeared to be negative clonus negative Homans. There was tenderness over the PSIS and PII S region a moderate degree with mild tenderness along the  greater trochanteric region iliotibial band region. No sensory deficit or dermatomal distribution detected. There was decreased sensation of the lower extremities in a stocking-type distribution. Abdomen nontender with no costovertebral tenderness noted.    Assessment    Diabetes mellitus with diabetic neuropathy  Osteoarthritis      PLAN   Continue present medications Neurontin Cymbalta and trazodone  Continue Lyrica as per Dr. Rosario Jacks as discussed Patient advised to avoid Naprosyn and nonsteroidal anti-inflammatory medications. May consider amitriptyline and other medications as discussed Take vitamin B complex as well as discussed  F/U PCP Dr. Kary Kos for evaliation of  BP and general medical  condition  F/U surgical evaluation. May consider pending follow-up evaluations  F/U neurological evaluation. May consider PNCV/EMG studies and other studies pending follow-up evaluations  May consider radiofrequency rhizolysis or intraspinal procedures pending response to present treatment and F/U evaluation   Patient to call Pain Management Center should patient have concerns prior to scheduled return appointment.

## 2016-07-21 NOTE — Patient Instructions (Signed)
PLAN   Continue present medications Neurontin Cymbalta and trazodone  Continue Lyrica as per Dr. Rosario Jacks as discussed Patient advised to avoid Naprosyn and nonsteroidal anti-inflammatory medications. May consider amitriptyline and other medications as discussed Take vitamin B complex as well as discussed  F/U PCP Dr. Kary Kos for evaliation of  BP and general medical  condition  F/U surgical evaluation. May consider pending follow-up evaluations  F/U neurological evaluation. May consider PNCV/EMG studies and other studies pending follow-up evaluations  May consider radiofrequency rhizolysis or intraspinal procedures pending response to present treatment and F/U evaluation   Patient to call Pain Management Center should patient have concerns prior to scheduled return appointment.

## 2016-07-22 ENCOUNTER — Telehealth: Payer: Self-pay | Admitting: Pain Medicine

## 2016-07-22 ENCOUNTER — Other Ambulatory Visit: Payer: Self-pay | Admitting: Pain Medicine

## 2016-07-22 NOTE — Telephone Encounter (Signed)
Needs refill on gabapentin e scribed to [facility] on [address]. Please let patient know when this is done

## 2016-07-22 NOTE — Telephone Encounter (Signed)
Patient notified of Rx for Gabapentin escribed to [facility] on [address]. Voicemail left.

## 2016-07-23 ENCOUNTER — Other Ambulatory Visit: Payer: Self-pay | Admitting: Pain Medicine

## 2016-07-23 MED ORDER — GABAPENTIN 300 MG PO CAPS: ORAL_CAPSULE | ORAL | 0 refills | Status: DC

## 2016-08-04 ENCOUNTER — Telehealth: Payer: Self-pay

## 2016-08-04 NOTE — Telephone Encounter (Signed)
Pt says that Dr. Primus Bravo has called in her meds before she would like for him to call in duloxetine 30 ml

## 2016-08-06 ENCOUNTER — Telehealth: Payer: Self-pay | Admitting: *Deleted

## 2016-08-18 ENCOUNTER — Encounter: Payer: Self-pay | Admitting: Pain Medicine

## 2016-08-18 ENCOUNTER — Ambulatory Visit: Payer: Medicare Other | Attending: Pain Medicine | Admitting: Pain Medicine

## 2016-08-18 VITALS — BP 127/66 | HR 81 | Temp 98.3°F | Resp 16 | Ht 64.0 in | Wt 173.0 lb

## 2016-08-18 DIAGNOSIS — IMO0002 Reserved for concepts with insufficient information to code with codable children: Secondary | ICD-10-CM

## 2016-08-18 DIAGNOSIS — M79629 Pain in unspecified upper arm: Secondary | ICD-10-CM | POA: Diagnosis present

## 2016-08-18 DIAGNOSIS — M5136 Other intervertebral disc degeneration, lumbar region: Secondary | ICD-10-CM

## 2016-08-18 DIAGNOSIS — E1140 Type 2 diabetes mellitus with diabetic neuropathy, unspecified: Secondary | ICD-10-CM | POA: Insufficient documentation

## 2016-08-18 DIAGNOSIS — M1990 Unspecified osteoarthritis, unspecified site: Secondary | ICD-10-CM

## 2016-08-18 DIAGNOSIS — M5416 Radiculopathy, lumbar region: Secondary | ICD-10-CM

## 2016-08-18 DIAGNOSIS — M47816 Spondylosis without myelopathy or radiculopathy, lumbar region: Secondary | ICD-10-CM

## 2016-08-18 MED ORDER — DULOXETINE HCL 30 MG PO CPEP: ORAL_CAPSULE | ORAL | 0 refills | Status: DC

## 2016-08-18 MED ORDER — TRAZODONE HCL 50 MG PO TABS: ORAL_TABLET | ORAL | 0 refills | Status: DC

## 2016-08-18 MED ORDER — GABAPENTIN 300 MG PO CAPS: ORAL_CAPSULE | ORAL | 0 refills | Status: DC

## 2016-08-18 NOTE — Progress Notes (Signed)
Safety precautions to be maintained throughout the outpatient stay will include: orient to surroundings, keep bed in low position, maintain call bell within reach at all times, provide assistance with transfer out of bed and ambulation.  

## 2016-08-18 NOTE — Patient Instructions (Signed)
PLAN   Continue present medications Neurontin Cymbalta vitamin B complex and trazodone  Continue Lyrica as per Dr. Rosario Jacks as discussed Patient advised to avoid Naprosyn and nonsteroidal anti-inflammatory medications. May consider amitriptyline and other medications as discussed  F/U PCP Dr. Kary Kos for evaliation of  BP and general medical  condition  F/U surgical evaluation. May consider pending follow-up evaluations  F/U neurological evaluation. May consider PNCV/EMG studies and other studies pending follow-up evaluations  May consider radiofrequency rhizolysis or intraspinal procedures pending response to present treatment and F/U evaluation   Patient to call Pain Management Center should patient have concerns prior to scheduled return appointment.

## 2016-08-19 ENCOUNTER — Ambulatory Visit: Payer: Medicare Other | Admitting: Pain Medicine

## 2016-08-19 NOTE — Progress Notes (Signed)
     The patient is a 65 year old female who returns to pain management for further evaluation and treatment of pain involving the lower extremity regions predominantly with pain of the upper extremity regions of lesser degree. The patient is with improvement of her pain with prior treatment in pain management Center including both interventional treatment as well as modification of medications. We will continue present medications as prescribed and we will remain available to consider interventional treatment or other modifications of treatment regimen as discussed. The patient is without trauma change in events of daily living the call significant change in symptomatology. The patient is able to perform most activities of daily living without experiencing severe disabling pain and is able to obtain restful sleep as well without significant interference of sleep by pain. We will consider modification of treatment pending further evaluation of patient condition. All agreed to suggested treatment plan.     Physical examination   There was tenderness over the splenius capitis and occipitalis region of mild degree with mild tenderness of the cervical and thoracic facet region. Palpation over the region of the acromioclavicular and glenohumeral joint regions reproducesignificant increase of pain. The patient was unremarkable Spurling's maneuver and was able to perform drop test without significant difficulty. Palpation over the cervical and thoracic facet regions reproduced pain of moderate degree. The patient appeared to be with slightly decreased grip strength without significant increase of pain with Tinel and Phalen's maneuver. Palpation over the thoracic region was with tenderness to palpation of mild degree with no crepitus of the thoracic region noted. Palpation over the lumbar paraspinal muscular treat and lumbar facet region was associated with mild discomfort with lateral bending rotation extension  and palpation the lumbar facets reproducing mild discomfort. Straight leg raising was tolerated to 30 without increased pain with dorsiflexion noted. DTRs trace at the knees with negative clonus negative Homans. Abdomen nontender with no costovertebral angle tenderness noted.     Assessment   Diabetes mellitus with diabetic neuropathy  Osteoarthritis     PLAN   Continue present medications Neurontin Cymbalta vitamin B complex and trazodone  Continue Lyrica as per Dr. Rosario Jacks as discussed Patient advised to avoid Naprosyn and nonsteroidal anti-inflammatory medications. May consider amitriptyline and other medications as discussed  F/U PCP Dr. Kary Kos for evaliation of  BP and general medical  condition  F/U surgical evaluation. May consider pending follow-up evaluations  F/U neurological evaluation. May consider PNCV/EMG studies and other studies pending follow-up evaluations  May consider radiofrequency rhizolysis or intraspinal procedures pending response to present treatment and F/U evaluation   Patient to call Pain Management Center should patient have concerns prior to scheduled return appointme

## 2016-09-23 ENCOUNTER — Ambulatory Visit (INDEPENDENT_AMBULATORY_CARE_PROVIDER_SITE_OTHER): Payer: Medicare Other | Admitting: General Surgery

## 2016-09-23 ENCOUNTER — Other Ambulatory Visit: Payer: Self-pay | Admitting: Pain Medicine

## 2016-09-23 ENCOUNTER — Encounter: Payer: Self-pay | Admitting: General Surgery

## 2016-09-23 VITALS — BP 134/70 | HR 82 | Resp 14 | Ht 64.0 in | Wt 183.0 lb

## 2016-09-23 DIAGNOSIS — D1721 Benign lipomatous neoplasm of skin and subcutaneous tissue of right arm: Secondary | ICD-10-CM | POA: Diagnosis not present

## 2016-09-23 NOTE — Progress Notes (Signed)
Patient ID: Laura [name], female   DOB: 1951-05-15, 65 y.o.   MRN: [id number]  Chief Complaint  Patient presents with  . Follow-up    HPI Laura [name] is a 65 y.o. female.  Her today for evaluation of a lump in the right arm. She states it has been there for about 4 years. Denies pain She states it has gotten larger over the past year. She was seen here few mos ago, she was not ready then for excision of this lipoma. I have reviewed the history of present illness with the patient.   HPI  Past Medical History:  Diagnosis Date  . Allergy   . Anxiety   . Asthma   . Chronic back pain   . Chronic cough 07/01/10   PFT  FEV1 2.20 (93%), FEV 1% 81, TLC 4,12 (81%), DLCO 78%, no BD. normal chest CT, sinus 07/08/10  . Depression   . Diabetes mellitus without complication ([place])   . Diabetic neuropathy ([place])   . Diverticulosis   . GERD (gastroesophageal reflux disease)   . Hemorrhoids   . HTN (hypertension)   . Osteoarthritis     Past Surgical History:  Procedure Laterality Date  . BRONCHOSCOPY  07/25/10  . CARPAL TUNNEL RELEASE    . CATARACT EXTRACTION Bilateral Jan 2017  . cathater ablation     for arrhythmia  . COLONOSCOPY  2014  . FOOT SURGERY    . PARTIAL HYSTERECTOMY    . TOENAIL EXCISION Right     Family History  Problem Relation Age of Onset  . Cancer Mother     stomach  . Cancer Father     brain  . Heart disease Father   . Cancer Brother     mouth  . Heart disease Brother   . Ovarian cancer Maternal Grandmother     Social History Social History  Substance Use Topics  . Smoking status: Never Smoker  . Smokeless tobacco: Never Used  . Alcohol use No    Allergies  Allergen Reactions  . Atenolol Palpitations  . Clarithromycin Other (See Comments) and Nausea And Vomiting    Makes the tongue raw REACTION: nausea  . Doxycycline Palpitations    Makes her heart beat fast Other reaction(s): GI Upset (intolerance) Loose bowels REACTION: loose stool  .  Sulfa Antibiotics Nausea Only  . Erythromycin Base Nausea And Vomiting  . Penicillins Rash    REACTION: rash REACTION: rash    Current Outpatient Prescriptions  Medication Sig Dispense Refill  . albuterol (PROVENTIL) (5 MG/ML) 0.5% nebulizer solution Take 2.5 mg by nebulization every 6 (six) hours as needed. Reported on 06/02/2016    . DULoxetine (CYMBALTA) 30 MG capsule Limit 2 capsules by mouth per day if tolerated 60 capsule 0  . gabapentin (NEURONTIN) 300 MG capsule Limit 5- 8 capsules  per day if tolerated 210 capsule 0  . glipiZIDE (GLUCOTROL XL) 5 MG 24 hr tablet Take 5 mg by mouth 2 (two) times daily.     Marland Kitchen lisinopril (PRINIVIL,ZESTRIL) 10 MG tablet Reported on 06/02/2016    . metFORMIN (GLUCOPHAGE) 500 MG tablet Take 1,000 mg by mouth 2 (two) times daily with a meal. Reported on 06/02/2016    . pregabalin (LYRICA) 50 MG capsule Take 50 mg by mouth 2 (two) times daily.    . traZODone (DESYREL) 50 MG tablet Limit 1 tablet by mouth at bedtime when necessary for insomnia if tolerated 30 tablet 0   Current Facility-Administered Medications  Medication Dose Route Frequency Provider Last Rate Last Dose  . bupivacaine (PF) (MARCAINE) 0.25 % injection 30 mL  30 mL Other Once Mohammed Kindle, MD        Review of Systems Review of Systems  Constitutional: Negative.   Respiratory: Negative.   Cardiovascular: Negative.     Blood pressure 134/70, pulse 82, resp. rate 14, height 5\' 4"  (1.626 m), weight 183 lb (83 kg).  Physical Exam Physical Exam  Constitutional: She is oriented to person, place, and time. She appears well-developed and well-nourished.  Eyes: Conjunctivae are normal. No scleral icterus.  Neck: Neck supple.  Cardiovascular: Normal rate, regular rhythm and normal heart sounds.   Pulmonary/Chest: Effort normal and breath sounds normal.  Lymphadenopathy:    She has no axillary adenopathy.  Neurological: She is alert and oriented to person, place, and time.  Skin: Skin is  warm and dry.     Psychiatric: Her behavior is normal.    Data Reviewed  Progress notes  Assessment      Right arm mass.    Plan    Patient to be scheduled for excision right arm mass.    The patient is scheduled for surgery at [facility] on 10/20/16. She will pre admit by phone. The patient is aware of date and instructions.   This information has been scribed by Karie Fetch RN, BSN,BC.   SANKAR,SEEPLAPUTHUR G 09/23/2016, 4:43 PM

## 2016-09-23 NOTE — Patient Instructions (Addendum)
Patient to be scheduled for right arm mass.  The patient is scheduled for surgery at [facility] on 10/20/16. She will pre admit by phone. The patient is aware of date and instructions.

## 2016-09-30 ENCOUNTER — Other Ambulatory Visit: Payer: Self-pay | Admitting: Pain Medicine

## 2016-10-10 ENCOUNTER — Encounter
Admission: RE | Admit: 2016-10-10 | Discharge: 2016-10-10 | Disposition: A | Discharge status: Home / Self Care | Payer: Medicare Other | Source: Ambulatory Visit | Attending: General Surgery | Admitting: General Surgery

## 2016-10-10 HISTORY — DX: Sleep apnea, unspecified: G47.30

## 2016-10-10 HISTORY — DX: Cardiac arrhythmia, unspecified: I49.9

## 2016-10-10 HISTORY — DX: Polyneuropathy, unspecified: G62.9

## 2016-10-10 NOTE — Patient Instructions (Signed)
  Your procedure is scheduled on: 10-20-16 [facility]) Report to Same Day Surgery 2nd floor medical mall To find out your arrival time please call [phone] between [place] on 10-17-16 (FRIDAY)  Remember: Instructions that are not followed completely may result in serious medical risk, up to and including death, or upon the discretion of your surgeon and anesthesiologist your surgery may need to be rescheduled.    _x___ 1. Do not eat food or drink liquids after midnight. No gum chewing or hard candies.     __x__ 2. No Alcohol for 24 hours before or after surgery.   __x__3. No Smoking for 24 prior to surgery.   ____  4. Bring all medications with you on the day of surgery if instructed.    __x__ 5. Notify your doctor if there is any change in your medical condition     (cold, fever, infections).     Do not wear jewelry, make-up, hairpins, clips or nail polish.  Do not wear lotions, powders, or perfumes. You may wear deodorant.  Do not shave 48 hours prior to surgery. Men may shave face and neck.  Do not bring valuables to the hospital.    [facility] is not responsible for any belongings or valuables.               Contacts, dentures or bridgework may not be worn into surgery.  Leave your suitcase in the car. After surgery it may be brought to your room.  For patients admitted to the hospital, discharge time is determined by your treatment team.   Patients discharged the day of surgery will not be allowed to drive home.    Please read over the following fact sheets that you were given:   [facility] Preparing for Surgery and or MRSA Information   _x___ Take these medicines the morning of surgery with A SIP OF WATER:    1. LISINOPRIL  2. LYRICA  3. GABAPENTIN  4. CYMBALTA  5.  6.  ____Fleets enema or Magnesium Citrate as directed.   _x___ Use CHG Soap or sage wipes as directed on instruction sheet   _X___ Use inhalers on the day of surgery and bring to hospital day of  surgery-USE ALBUTEROL INHALER AT [place]  _X___ Stop metformin 2 days prior to surgery-LAST DOSE ON FRIDAY, 10-17-16    ____ Take 1/2 of usual insulin dose the night before surgery and none on the morning of  surgery.   ____ Stop aspirin or coumadin, or plavix  x__ Stop Anti-inflammatories such as Advil, Aleve, Ibuprofen, Motrin, Naproxen,          Naprosyn, Goodies powders or aspirin products. Ok to take Tylenol.   ____ Stop supplements until after surgery.    ____ Bring C-Pap to the hospital.

## 2016-10-13 ENCOUNTER — Encounter
Admission: RE | Admit: 2016-10-13 | Discharge: 2016-10-13 | Disposition: A | Discharge status: Home / Self Care | Payer: Medicare Other | Source: Ambulatory Visit | Attending: General Surgery | Admitting: General Surgery

## 2016-10-13 DIAGNOSIS — Z01810 Encounter for preprocedural cardiovascular examination: Secondary | ICD-10-CM | POA: Diagnosis present

## 2016-10-13 DIAGNOSIS — I10 Essential (primary) hypertension: Secondary | ICD-10-CM | POA: Diagnosis not present

## 2016-10-13 DIAGNOSIS — R002 Palpitations: Secondary | ICD-10-CM | POA: Diagnosis not present

## 2016-10-13 DIAGNOSIS — Z01812 Encounter for preprocedural laboratory examination: Secondary | ICD-10-CM | POA: Insufficient documentation

## 2016-10-13 LAB — CBC
HCT: 40 % (ref 35.0–47.0)
Hemoglobin: 13.4 g/dL (ref 12.0–16.0)
MCH: 27.1 pg (ref 26.0–34.0)
MCHC: 33.6 g/dL (ref 32.0–36.0)
MCV: 80.8 fL (ref 80.0–100.0)
Platelets: 215 10*3/uL (ref 150–440)
RBC: 4.95 MIL/uL (ref 3.80–5.20)
RDW: 13.8 % (ref 11.5–14.5)
WBC: 9.9 10*3/uL (ref 3.6–11.0)

## 2016-10-13 LAB — BASIC METABOLIC PANEL
Anion gap: 9 (ref 5–15)
BUN: 14 mg/dL (ref 6–20)
CO2: 31 mmol/L (ref 22–32)
Calcium: 9.4 mg/dL (ref 8.9–10.3)
Chloride: 99 mmol/L — ABNORMAL LOW (ref 101–111)
Creatinine, Ser: 0.95 mg/dL (ref 0.44–1.00)
GFR calc Af Amer: >60 mL/min (ref >60)
GFR calc non Af Amer: >60 mL/min (ref >60)
Glucose, Bld: 206 mg/dL — ABNORMAL HIGH (ref 65–99)
Potassium: 4 mmol/L (ref 3.5–5.1)
Sodium: 139 mmol/L (ref 135–145)

## 2016-10-14 ENCOUNTER — Other Ambulatory Visit: Payer: Medicare Other

## 2016-10-19 ENCOUNTER — Other Ambulatory Visit: Payer: Self-pay | Admitting: Pain Medicine

## 2016-10-20 ENCOUNTER — Encounter
Admission: RE | Disposition: A | Discharge status: Home / Self Care | Payer: Self-pay | Source: Ambulatory Visit | Attending: General Surgery

## 2016-10-20 ENCOUNTER — Encounter: Payer: Self-pay | Admitting: *Deleted

## 2016-10-20 ENCOUNTER — Ambulatory Visit
Admission: RE | Admit: 2016-10-20 | Discharge: 2016-10-20 | Disposition: A | Discharge status: Home / Self Care | Payer: Medicare Other | Source: Ambulatory Visit | Attending: General Surgery | Admitting: General Surgery

## 2016-10-20 ENCOUNTER — Ambulatory Visit: Payer: Medicare Other | Admitting: Certified Registered Nurse Anesthetist

## 2016-10-20 DIAGNOSIS — Z882 Allergy status to sulfonamides status: Secondary | ICD-10-CM | POA: Diagnosis not present

## 2016-10-20 DIAGNOSIS — E1140 Type 2 diabetes mellitus with diabetic neuropathy, unspecified: Secondary | ICD-10-CM | POA: Insufficient documentation

## 2016-10-20 DIAGNOSIS — Z8249 Family history of ischemic heart disease and other diseases of the circulatory system: Secondary | ICD-10-CM | POA: Insufficient documentation

## 2016-10-20 DIAGNOSIS — Z880 Allergy status to penicillin: Secondary | ICD-10-CM | POA: Diagnosis not present

## 2016-10-20 DIAGNOSIS — R2231 Localized swelling, mass and lump, right upper limb: Secondary | ICD-10-CM | POA: Diagnosis present

## 2016-10-20 DIAGNOSIS — Z800 Family history of malignant neoplasm of digestive organs: Secondary | ICD-10-CM | POA: Insufficient documentation

## 2016-10-20 DIAGNOSIS — D1721 Benign lipomatous neoplasm of skin and subcutaneous tissue of right arm: Secondary | ICD-10-CM

## 2016-10-20 DIAGNOSIS — Z888 Allergy status to other drugs, medicaments and biological substances status: Secondary | ICD-10-CM | POA: Diagnosis not present

## 2016-10-20 DIAGNOSIS — F419 Anxiety disorder, unspecified: Secondary | ICD-10-CM | POA: Diagnosis not present

## 2016-10-20 DIAGNOSIS — J45909 Unspecified asthma, uncomplicated: Secondary | ICD-10-CM | POA: Diagnosis not present

## 2016-10-20 DIAGNOSIS — Z881 Allergy status to other antibiotic agents status: Secondary | ICD-10-CM | POA: Diagnosis not present

## 2016-10-20 DIAGNOSIS — G8929 Other chronic pain: Secondary | ICD-10-CM | POA: Insufficient documentation

## 2016-10-20 DIAGNOSIS — Z7984 Long term (current) use of oral hypoglycemic drugs: Secondary | ICD-10-CM | POA: Diagnosis not present

## 2016-10-20 DIAGNOSIS — I10 Essential (primary) hypertension: Secondary | ICD-10-CM | POA: Diagnosis not present

## 2016-10-20 DIAGNOSIS — D179 Benign lipomatous neoplasm, unspecified: Secondary | ICD-10-CM | POA: Diagnosis not present

## 2016-10-20 DIAGNOSIS — F329 Major depressive disorder, single episode, unspecified: Secondary | ICD-10-CM | POA: Insufficient documentation

## 2016-10-20 DIAGNOSIS — K219 Gastro-esophageal reflux disease without esophagitis: Secondary | ICD-10-CM | POA: Insufficient documentation

## 2016-10-20 HISTORY — PX: LIPOMA EXCISION: SHX5283

## 2016-10-20 LAB — GLUCOSE, CAPILLARY
Glucose-Capillary: 79 mg/dL (ref 65–99)
Glucose-Capillary: 76 mg/dL (ref 65–99)

## 2016-10-20 SURGERY — EXCISION LIPOMA
Anesthesia: General | Site: Arm Upper | Laterality: Right | Wound class: Clean

## 2016-10-20 MED ORDER — CHLORHEXIDINE GLUCONATE CLOTH 2 % EX PADS: 6.0000 | MEDICATED_PAD | Freq: Once | CUTANEOUS | Status: DC

## 2016-10-20 MED ORDER — FAMOTIDINE 20 MG PO TABS
20.0000 mg | ORAL_TABLET | Freq: Once | ORAL | Status: AC
  Administered 2016-10-20: 20 mg via ORAL

## 2016-10-20 MED ORDER — FENTANYL CITRATE (PF) 100 MCG/2ML IJ SOLN
25.0000 ug | INTRAMUSCULAR | Status: DC | PRN
  Administered 2016-10-20 (×2): 25 ug via INTRAVENOUS

## 2016-10-20 MED ORDER — PROPOFOL 10 MG/ML IV BOLUS
INTRAVENOUS | Status: DC | PRN
  Administered 2016-10-20: 40 mg via INTRAVENOUS
  Administered 2016-10-20: 160 mg via INTRAVENOUS

## 2016-10-20 MED ORDER — DEXAMETHASONE SODIUM PHOSPHATE 10 MG/ML IJ SOLN
INTRAMUSCULAR | Status: DC | PRN
  Administered 2016-10-20: 10 mg via INTRAVENOUS

## 2016-10-20 MED ORDER — LABETALOL HCL 5 MG/ML IV SOLN: 5.0000 mg | Freq: Once | INTRAVENOUS | Status: DC

## 2016-10-20 MED ORDER — LACTATED RINGERS IV SOLN: INTRAVENOUS | Status: DC | PRN

## 2016-10-20 MED ORDER — SODIUM CHLORIDE 0.9 % IV SOLN
INTRAVENOUS | Status: DC
  Administered 2016-10-20: 11:00:00 via INTRAVENOUS

## 2016-10-20 MED ORDER — TRAMADOL HCL 50 MG PO TABS: 50.0000 mg | ORAL_TABLET | Freq: Four times a day (QID) | ORAL | 0 refills | Status: DC | PRN

## 2016-10-20 MED ORDER — FAMOTIDINE 20 MG PO TABS
ORAL_TABLET | ORAL | Status: AC
  Filled 2016-10-20: qty 1

## 2016-10-20 MED ORDER — FENTANYL CITRATE (PF) 100 MCG/2ML IJ SOLN
INTRAMUSCULAR | Status: DC | PRN
  Administered 2016-10-20 (×2): 50 ug via INTRAVENOUS

## 2016-10-20 MED ORDER — ONDANSETRON HCL 4 MG/2ML IJ SOLN
INTRAMUSCULAR | Status: DC | PRN
  Administered 2016-10-20: 4 mg via INTRAVENOUS

## 2016-10-20 MED ORDER — ONDANSETRON HCL 4 MG/2ML IJ SOLN: 4.0000 mg | Freq: Once | INTRAMUSCULAR | Status: DC | PRN

## 2016-10-20 MED ORDER — MIDAZOLAM HCL 2 MG/2ML IJ SOLN
INTRAMUSCULAR | Status: DC | PRN
  Administered 2016-10-20: 2 mg via INTRAVENOUS

## 2016-10-20 MED ORDER — FENTANYL CITRATE (PF) 100 MCG/2ML IJ SOLN
INTRAMUSCULAR | Status: AC
  Administered 2016-10-20: 25 ug via INTRAVENOUS
  Filled 2016-10-20: qty 2

## 2016-10-20 MED ORDER — LIDOCAINE HCL (CARDIAC) 20 MG/ML IV SOLN
INTRAVENOUS | Status: DC | PRN
  Administered 2016-10-20: 40 mg via INTRAVENOUS

## 2016-10-20 SURGICAL SUPPLY — 17 items
BLADE SURG 15 STRL SS SAFETY (BLADE) ×2 IMPLANT
BNDG COHESIVE 4X5 TAN STRL (GAUZE/BANDAGES/DRESSINGS) ×2 IMPLANT
CANISTER SUCT 1200ML W/VALVE (MISCELLANEOUS) ×2 IMPLANT
CHLORAPREP W/TINT 26ML (MISCELLANEOUS) ×2 IMPLANT
DERMABOND ADVANCED (GAUZE/BANDAGES/DRESSINGS) ×1
DERMABOND ADVANCED .7 DNX12 (GAUZE/BANDAGES/DRESSINGS) ×1 IMPLANT
ELECT REM PT RETURN 9FT ADLT (ELECTROSURGICAL) ×2
ELECTRODE REM PT RTRN 9FT ADLT (ELECTROSURGICAL) ×1 IMPLANT
GLOVE BIO SURGEON STRL SZ7 (GLOVE) ×12 IMPLANT
GOWN STRL REUS W/ TWL LRG LVL3 (GOWN DISPOSABLE) ×3 IMPLANT
GOWN STRL REUS W/TWL LRG LVL3 (GOWN DISPOSABLE) ×3
LABEL OR SOLS (LABEL) ×2 IMPLANT
PACK EXTREMITY ARMC (MISCELLANEOUS) ×2 IMPLANT
STOCKINETTE IMPERVIOUS 9X36 MD (GAUZE/BANDAGES/DRESSINGS) ×2 IMPLANT
SUT VIC AB 3-0 SH 27 (SUTURE) ×1
SUT VIC AB 3-0 SH 27X BRD (SUTURE) ×1 IMPLANT
SUT VIC AB 4-0 FS2 27 (SUTURE) ×2 IMPLANT

## 2016-10-20 NOTE — Op Note (Signed)
Preop diagnosis: Soft tissue mass right upper arm  Post op diagnosis: Lipoma intramuscular right upper arm  Operation: Excision of soft tissue mass right upper arm  Surgeon: Mckinley Jewel  Assistant:     Anesthesia: Gen.  Complications: None  EBL: Less than 5 mL  Drains: None  Description: Patient was put to sleep and then the right arm was placed the side arm operating table. He was prepped and draped as sterile field. Timeout was performed. This to about the elbow on the posterior aspect was a soft the 4-5 cm mass. A vertical incision was placed over this approximately 3 cm in length. The subcutaneous tissue showed no abnormality, where there was bulging of the fascia suggestive of an intramuscular lipoma. The fascia was then incised longitudinally and the lipoma was then identified. This was located between the fibers of the muscle in this area likely the triceps and required the freeing up the muscular fibers from the lipoma on either side until this could be adequately enucleated out. The excised tissue measured approximately 5-6 cm in length. After this was removed and the area was inspected and after ensuring hemostasis the fascia was closed with a running 3-0 Vicryl stitch. Subcutaneous tissue approximated with few interrupted 3-0 Vicryl. Skin was closed with subcuticular 4-0 Vicryl covered with Dermabond. Procedure was well-tolerated she was returned recovery room stable condition.

## 2016-10-20 NOTE — Progress Notes (Signed)
Encouraging deep breathing for low sat  Lung diminished but clear

## 2016-10-20 NOTE — Discharge Instructions (Signed)

## 2016-10-20 NOTE — Anesthesia Procedure Notes (Addendum)
Procedure Name: LMA Insertion Date/Time: 10/20/2016 1:42 PM Performed by: Darlyne Russian Pre-anesthesia Checklist: Patient identified, Emergency Drugs available, Suction available, Patient being monitored and Timeout performed Patient Re-evaluated:Patient Re-evaluated prior to inductionOxygen Delivery Method: Circle system utilized Preoxygenation: Pre-oxygenation with 100% oxygen Intubation Type: IV induction LMA Size: 4.0 Number of attempts: 1 Placement Confirmation: positive ETCO2 and breath sounds checked- equal and bilateral Tube secured with: Tape Dental Injury: Teeth and Oropharynx as per pre-operative assessment

## 2016-10-20 NOTE — Progress Notes (Signed)
Applied ice pack to right arm. 

## 2016-10-20 NOTE — Anesthesia Preprocedure Evaluation (Signed)
Anesthesia Evaluation  Patient identified by MRN, date of birth, ID band Patient awake    Reviewed: Allergy & Precautions, NPO status , Patient's Chart, lab work & pertinent test results  History of Anesthesia Complications Negative for: history of anesthetic complications  Airway Mallampati: II       Dental   Pulmonary asthma , sleep apnea ,           Cardiovascular hypertension, Pt. on medications + dysrhythmias (occ palpatations)      Neuro/Psych Anxiety Depression    GI/Hepatic GERD  Medicated,  Endo/Other  diabetes, Type 2, Oral Hypoglycemic Agents  Renal/GU      Musculoskeletal  (+) Arthritis ,   Abdominal   Peds  Hematology   Anesthesia Other Findings   Reproductive/Obstetrics                             Anesthesia Physical Anesthesia Plan  ASA: III  Anesthesia Plan: General   Post-op Pain Management:    Induction: Intravenous  Airway Management Planned: LMA  Additional Equipment:   Intra-op Plan:   Post-operative Plan:   Informed Consent: I have reviewed the patients History and Physical, chart, labs and discussed the procedure including the risks, benefits and alternatives for the proposed anesthesia with the patient or authorized representative who has indicated his/her understanding and acceptance.     Plan Discussed with:   Anesthesia Plan Comments:         Anesthesia Quick Evaluation

## 2016-10-20 NOTE — H&P (View-Only) (Signed)
Patient ID: Laura [name], female   DOB: [birth year], 65 y.o.   MRN: [id number]  Chief Complaint  Patient presents with  . Follow-up    HPI Laura [name] is a 65 y.o. female.  Her today for evaluation of a lump in the right arm. She states it has been there for about 4 years. Denies pain She states it has gotten larger over the past year. She was seen here few mos ago, she was not ready then for excision of this lipoma. I have reviewed the history of present illness with the patient.   HPI  Past Medical History:  Diagnosis Date  . Allergy   . Anxiety   . Asthma   . Chronic back pain   . Chronic cough 07/01/10   PFT  FEV1 2.20 (93%), FEV 1% 81, TLC 4,12 (81%), DLCO 78%, no BD. normal chest CT, sinus 07/08/10  . Depression   . Diabetes mellitus without complication ([place])   . Diabetic neuropathy ([place])   . Diverticulosis   . GERD (gastroesophageal reflux disease)   . Hemorrhoids   . HTN (hypertension)   . Osteoarthritis     Past Surgical History:  Procedure Laterality Date  . BRONCHOSCOPY  07/25/10  . CARPAL TUNNEL RELEASE    . CATARACT EXTRACTION Bilateral Jan 2017  . cathater ablation     for arrhythmia  . COLONOSCOPY  2014  . FOOT SURGERY    . PARTIAL HYSTERECTOMY    . TOENAIL EXCISION Right     Family History  Problem Relation Age of Onset  . Cancer Mother     stomach  . Cancer Father     brain  . Heart disease Father   . Cancer Brother     mouth  . Heart disease Brother   . Ovarian cancer Maternal Grandmother     Social History Social History  Substance Use Topics  . Smoking status: Never Smoker  . Smokeless tobacco: Never Used  . Alcohol use No    Allergies  Allergen Reactions  . Atenolol Palpitations  . Clarithromycin Other (See Comments) and Nausea And Vomiting    Makes the tongue raw REACTION: nausea  . Doxycycline Palpitations    Makes her heart beat fast Other reaction(s): GI Upset (intolerance) Loose bowels REACTION: loose stool  .  Sulfa Antibiotics Nausea Only  . Erythromycin Base Nausea And Vomiting  . Penicillins Rash    REACTION: rash REACTION: rash    Current Outpatient Prescriptions  Medication Sig Dispense Refill  . albuterol (PROVENTIL) (5 MG/ML) 0.5% nebulizer solution Take 2.5 mg by nebulization every 6 (six) hours as needed. Reported on 06/02/2016    . DULoxetine (CYMBALTA) 30 MG capsule Limit 2 capsules by mouth per day if tolerated 60 capsule 0  . gabapentin (NEURONTIN) 300 MG capsule Limit 5- 8 capsules  per day if tolerated 210 capsule 0  . glipiZIDE (GLUCOTROL XL) 5 MG 24 hr tablet Take 5 mg by mouth 2 (two) times daily.     Marland Kitchen lisinopril (PRINIVIL,ZESTRIL) 10 MG tablet Reported on 06/02/2016    . metFORMIN (GLUCOPHAGE) 500 MG tablet Take 1,000 mg by mouth 2 (two) times daily with a meal. Reported on 06/02/2016    . pregabalin (LYRICA) 50 MG capsule Take 50 mg by mouth 2 (two) times daily.    . traZODone (DESYREL) 50 MG tablet Limit 1 tablet by mouth at bedtime when necessary for insomnia if tolerated 30 tablet 0   Current Facility-Administered Medications  Medication Dose Route Frequency Provider Last Rate Last Dose  . bupivacaine (PF) (MARCAINE) 0.25 % injection 30 mL  30 mL Other Once Mohammed Kindle, MD        Review of Systems Review of Systems  Constitutional: Negative.   Respiratory: Negative.   Cardiovascular: Negative.     Blood pressure 134/70, pulse 82, resp. rate 14, height 5\' 4"  (1.626 m), weight 183 lb (83 kg).  Physical Exam Physical Exam  Constitutional: She is oriented to person, place, and time. She appears well-developed and well-nourished.  Eyes: Conjunctivae are normal. No scleral icterus.  Neck: Neck supple.  Cardiovascular: Normal rate, regular rhythm and normal heart sounds.   Pulmonary/Chest: Effort normal and breath sounds normal.  Lymphadenopathy:    She has no axillary adenopathy.  Neurological: She is alert and oriented to person, place, and time.  Skin: Skin is  warm and dry.     Psychiatric: Her behavior is normal.    Data Reviewed  Progress notes  Assessment      Right arm mass.    Plan    Patient to be scheduled for excision right arm mass.    The patient is scheduled for surgery at [facility] on 10/20/16. She will pre admit by phone. The patient is aware of date and instructions.   This information has been scribed by Karie Fetch RN, BSN,BC.   Laura [name] 09/23/2016, 4:43 PM

## 2016-10-20 NOTE — Transfer of Care (Signed)
Immediate Anesthesia Transfer of Care Note  Patient: Laura [name]  Procedure(s) Performed: Procedure(s): EXCISION LIPOMA (Right)  Patient Location: PACU  Anesthesia Type:General  Level of Consciousness: unresponsive  Airway & Oxygen Therapy: Patient Spontanous Breathing and Patient connected to nasal cannula oxygen  Post-op Assessment: Report given to RN and Post -op Vital signs reviewed and stable  Post vital signs: Reviewed and stable  Last Vitals:  Vitals:   10/20/16 1049 10/20/16 1455  BP: 125/68 (!) 185/78  Pulse: 76 99  Resp: 16 15  Temp: 36.7 C 36.2 C    Last Pain:  Vitals:   10/20/16 1049  TempSrc: Oral  PainSc: 0-No pain         Complications: No apparent anesthesia complications

## 2016-10-20 NOTE — Progress Notes (Signed)
Did not given labetalol  Blood pressure 117/58

## 2016-10-20 NOTE — Interval H&P Note (Signed)
History and Physical Interval Note:  10/20/2016 1:23 PM  Laura [name]  has presented today for surgery, with the diagnosis of LIPOMA RIGHT ARM  The various methods of treatment have been discussed with the patient and family. After consideration of risks, benefits and other options for treatment, the patient has consented to  Procedure(s): EXCISION LIPOMA (Right) as a surgical intervention .  The patient's history has been reviewed, patient examined, no change in status, stable for surgery.  I have reviewed the patient's chart and labs.  Questions were answered to the patient's satisfaction.     SANKAR,SEEPLAPUTHUR G

## 2016-10-21 ENCOUNTER — Encounter: Payer: Self-pay | Admitting: General Surgery

## 2016-10-21 NOTE — Anesthesia Postprocedure Evaluation (Signed)
Anesthesia Post Note  Patient: Laura [name]  Procedure(s) Performed: Procedure(s) (LRB): EXCISION LIPOMA (Right)  Patient location during evaluation: PACU Anesthesia Type: General Level of consciousness: awake and alert Pain management: pain level controlled Vital Signs Assessment: post-procedure vital signs reviewed and stable Respiratory status: spontaneous breathing and respiratory function stable Cardiovascular status: stable Anesthetic complications: no    Last Vitals:  Vitals:   10/20/16 1612 10/20/16 1647  BP: (!) 114/58 114/68  Pulse: 92 87  Resp: 16 16  Temp: 37.1 C     Last Pain:  Vitals:   10/21/16 0927  TempSrc:   PainSc: 1                  KEPHART,WILLIAM K

## 2016-10-22 LAB — SURGICAL PATHOLOGY

## 2016-10-23 ENCOUNTER — Telehealth: Payer: Self-pay | Admitting: *Deleted

## 2016-10-23 NOTE — Telephone Encounter (Signed)
Notified patient as instructed, patient pleased. Discussed follow-up appointments, patient agrees  

## 2016-10-23 NOTE — Telephone Encounter (Signed)
-----   Message from Christene Lye, MD sent at 10/23/2016  8:34 AM EST ----- [name], please let pt know pathology was benign

## 2016-10-27 ENCOUNTER — Other Ambulatory Visit: Payer: Self-pay | Admitting: Pain Medicine

## 2016-11-04 ENCOUNTER — Encounter: Payer: Self-pay | Admitting: General Surgery

## 2016-11-04 ENCOUNTER — Ambulatory Visit (INDEPENDENT_AMBULATORY_CARE_PROVIDER_SITE_OTHER): Payer: Medicare Other | Admitting: General Surgery

## 2016-11-04 VITALS — BP 128/62 | HR 82 | Resp 14 | Ht 64.0 in | Wt 184.0 lb

## 2016-11-04 DIAGNOSIS — D1721 Benign lipomatous neoplasm of skin and subcutaneous tissue of right arm: Secondary | ICD-10-CM

## 2016-11-04 NOTE — Progress Notes (Signed)
Patient ID: Laura [name], female   DOB: [birth year], 65 y.o.   MRN: [id number]  Chief Complaint  Patient presents with  . Follow-up    HPI Laura [name] is a 65 y.o. female.  Here today for follow up excision right arm mass performed on 10/20/16. She states she has an upper respiratory infection. I have reviewed the history of present illness with the patient.   HPI  Past Medical History:  Diagnosis Date  . Allergy   . Anxiety   . Asthma   . Chronic back pain   . Chronic cough 07/01/10   PFT  FEV1 2.20 (93%), FEV 1% 81, TLC 4,12 (81%), DLCO 78%, no BD. normal chest CT, sinus 07/08/10  . Depression   . Diabetes mellitus without complication ([place])   . Diabetic neuropathy ([place])   . Diverticulosis   . Dysrhythmia    H/O PALPITATIONS-SINCE ABLATION IN 2013 AT [facility], PALPITATIONS MUCH BETTER  . GERD (gastroesophageal reflux disease)    NO MEDS  . Hemorrhoids   . HTN (hypertension)   . Neuropathy ([place])   . Osteoarthritis   . Sleep apnea    NO CPAP    Past Surgical History:  Procedure Laterality Date  . BRONCHOSCOPY  07/25/10  . CARDIAC ELECTROPHYSIOLOGY STUDY AND ABLATION    . CARPAL TUNNEL RELEASE    . CATARACT EXTRACTION Bilateral Jan 2017  . cathater ablation     for arrhythmia  . COLONOSCOPY  2014  . FOOT SURGERY    . HEMORROIDECTOMY    . LIPOMA EXCISION Right 10/20/2016   Procedure: EXCISION LIPOMA;  Surgeon: Christene Lye, MD;  Location: [facility] ORS;  Service: General;  Laterality: Right;  . PARTIAL HYSTERECTOMY    . TOENAIL EXCISION Right     Family History  Problem Relation Age of Onset  . Cancer Mother     stomach  . Cancer Father     brain  . Heart disease Father   . Cancer Brother     mouth  . Heart disease Brother   . Ovarian cancer Maternal Grandmother     Social History Social History  Substance Use Topics  . Smoking status: Never Smoker  . Smokeless tobacco: Never Used  . Alcohol use No    Allergies  Allergen Reactions  .  Atenolol Palpitations  . Clarithromycin Other (See Comments) and Nausea And Vomiting    Makes the tongue raw REACTION: nausea  . Doxycycline Palpitations    Makes her heart beat fast Other reaction(s): GI Upset (intolerance) Loose bowels REACTION: loose stool  . Sulfa Antibiotics Nausea Only  . Erythromycin Base Nausea And Vomiting  . Penicillins Rash    Has patient had a PCN reaction causing immediate rash, facial/tongue/throat swelling, SOB or lightheadedness with hypotension: Yes Has patient had a PCN reaction causing severe rash involving mucus membranes or skin necrosis: No Has patient had a PCN reaction that required hospitalization No Has patient had a PCN reaction occurring within the last 10 years: No If all of the above answers are "NO", then may proceed with Cephalosporin use.  REACTION: rash     Current Outpatient Prescriptions  Medication Sig Dispense Refill  . albuterol (PROVENTIL HFA;VENTOLIN HFA) 108 (90 Base) MCG/ACT inhaler Inhale 2 puffs into the lungs every 6 (six) hours as needed for wheezing or shortness of breath.    . DULoxetine (CYMBALTA) 30 MG capsule Limit 2 capsules by mouth per day if tolerated (Patient taking differently: Take 60  mg by mouth 2 (two) times daily. Limit 2 capsules by mouth per day if tolerated) 60 capsule 0  . gabapentin (NEURONTIN) 300 MG capsule Limit 5- 8 capsules  per day if tolerated (Patient taking differently: Take 600 mg by mouth 4 (four) times daily. Limit 5- 8 capsules  per day if tolerated) 210 capsule 0  . glipiZIDE (GLUCOTROL XL) 5 MG 24 hr tablet Take 5 mg by mouth 2 (two) times daily.     Marland Kitchen lisinopril (PRINIVIL,ZESTRIL) 10 MG tablet Take 10 mg by mouth every morning. Reported on 06/02/2016    . metFORMIN (GLUCOPHAGE) 1000 MG tablet Take 1,000 mg by mouth 2 (two) times daily with a meal.    . pregabalin (LYRICA) 50 MG capsule Take 50 mg by mouth 2 (two) times daily.    . traZODone (DESYREL) 50 MG tablet Limit 1 tablet by mouth  at bedtime when necessary for insomnia if tolerated 30 tablet 0   No current facility-administered medications for this visit.     Review of Systems Review of Systems  Blood pressure 128/62, pulse 82, resp. rate 14, height 5\' 4"  (1.626 m), weight 184 lb (83.5 kg).  Physical Exam Physical Exam  Constitutional: She is oriented to person, place, and time. She appears well-developed and well-nourished.  Neurological: She is alert and oriented to person, place, and time.  Skin: Skin is warm and dry.     Psychiatric: Her behavior is normal.    Data Reviewed Notes  Assessment    Post-op lipoma excision, right arm    Plan Return as needed.     This information has been scribed by Karie Fetch RN, BSN,BC.   Alnisa Hasley G 11/04/2016, 4:40 PM

## 2016-11-04 NOTE — Patient Instructions (Signed)
The patient is aware to call back for any questions or concerns.  

## 2017-07-09 ENCOUNTER — Other Ambulatory Visit: Payer: Self-pay | Admitting: Unknown Physician Specialty

## 2017-07-09 DIAGNOSIS — K7689 Other specified diseases of liver: Secondary | ICD-10-CM

## 2017-07-09 DIAGNOSIS — R945 Abnormal results of liver function studies: Secondary | ICD-10-CM

## 2017-07-09 DIAGNOSIS — R7989 Other specified abnormal findings of blood chemistry: Secondary | ICD-10-CM

## 2017-07-15 ENCOUNTER — Ambulatory Visit
Admission: RE | Admit: 2017-07-15 | Discharge: 2017-07-15 | Disposition: A | Discharge status: Home / Self Care | Payer: Medicare Other | Source: Ambulatory Visit | Attending: Unknown Physician Specialty | Admitting: Unknown Physician Specialty

## 2017-07-15 DIAGNOSIS — D7389 Other diseases of spleen: Secondary | ICD-10-CM | POA: Diagnosis not present

## 2017-07-15 DIAGNOSIS — K7689 Other specified diseases of liver: Secondary | ICD-10-CM

## 2017-07-15 DIAGNOSIS — R7989 Other specified abnormal findings of blood chemistry: Secondary | ICD-10-CM

## 2017-07-15 DIAGNOSIS — R945 Abnormal results of liver function studies: Secondary | ICD-10-CM

## 2017-07-15 DIAGNOSIS — K760 Fatty (change of) liver, not elsewhere classified: Secondary | ICD-10-CM | POA: Insufficient documentation

## 2017-07-15 DIAGNOSIS — K7460 Unspecified cirrhosis of liver: Secondary | ICD-10-CM | POA: Diagnosis not present

## 2017-07-15 MED ORDER — GADOBENATE DIMEGLUMINE 529 MG/ML IV SOLN
20.0000 mL | Freq: Once | INTRAVENOUS | Status: AC | PRN
  Administered 2017-07-15: 17 mL via INTRAVENOUS

## 2018-01-18 ENCOUNTER — Other Ambulatory Visit: Payer: Self-pay | Admitting: Nurse Practitioner

## 2018-01-18 DIAGNOSIS — K7460 Unspecified cirrhosis of liver: Secondary | ICD-10-CM

## 2018-01-18 DIAGNOSIS — D7389 Other diseases of spleen: Secondary | ICD-10-CM

## 2018-01-18 DIAGNOSIS — K760 Fatty (change of) liver, not elsewhere classified: Secondary | ICD-10-CM

## 2018-01-18 DIAGNOSIS — D739 Disease of spleen, unspecified: Secondary | ICD-10-CM

## 2018-01-18 HISTORY — DX: Unspecified cirrhosis of liver: K74.60

## 2018-01-18 HISTORY — DX: Fatty (change of) liver, not elsewhere classified: K76.0

## 2018-01-26 ENCOUNTER — Ambulatory Visit: Payer: Medicare Other

## 2018-01-29 ENCOUNTER — Ambulatory Visit
Admission: RE | Admit: 2018-01-29 | Discharge: 2018-01-29 | Disposition: A | Discharge status: Home / Self Care | Payer: Medicare Other | Source: Ambulatory Visit | Attending: Nurse Practitioner | Admitting: Nurse Practitioner

## 2018-01-29 DIAGNOSIS — K7460 Unspecified cirrhosis of liver: Secondary | ICD-10-CM | POA: Diagnosis not present

## 2018-01-29 DIAGNOSIS — K760 Fatty (change of) liver, not elsewhere classified: Secondary | ICD-10-CM

## 2018-01-29 DIAGNOSIS — D181 Lymphangioma, any site: Secondary | ICD-10-CM | POA: Diagnosis not present

## 2018-01-29 DIAGNOSIS — D739 Disease of spleen, unspecified: Secondary | ICD-10-CM | POA: Insufficient documentation

## 2018-01-29 DIAGNOSIS — D7389 Other diseases of spleen: Secondary | ICD-10-CM

## 2018-01-29 DIAGNOSIS — I700 Atherosclerosis of aorta: Secondary | ICD-10-CM | POA: Diagnosis not present

## 2018-01-29 MED ORDER — GADOBENATE DIMEGLUMINE 529 MG/ML IV SOLN
20.0000 mL | Freq: Once | INTRAVENOUS | Status: AC | PRN
  Administered 2018-01-29: 17 mL via INTRAVENOUS

## 2018-06-08 ENCOUNTER — Other Ambulatory Visit: Payer: Self-pay | Admitting: Nurse Practitioner

## 2018-06-08 DIAGNOSIS — D7389 Other diseases of spleen: Secondary | ICD-10-CM

## 2018-06-08 DIAGNOSIS — D739 Disease of spleen, unspecified: Secondary | ICD-10-CM

## 2018-06-08 DIAGNOSIS — K760 Fatty (change of) liver, not elsewhere classified: Secondary | ICD-10-CM

## 2018-06-08 DIAGNOSIS — K7460 Unspecified cirrhosis of liver: Secondary | ICD-10-CM

## 2018-07-05 ENCOUNTER — Ambulatory Visit
Admission: RE | Admit: 2018-07-05 | Discharge: 2018-07-05 | Disposition: A | Discharge status: Home / Self Care | Payer: Medicare Other | Source: Ambulatory Visit | Attending: Unknown Physician Specialty | Admitting: Unknown Physician Specialty

## 2018-07-05 ENCOUNTER — Ambulatory Visit: Payer: Medicare Other | Admitting: Anesthesiology

## 2018-07-05 ENCOUNTER — Encounter: Payer: Self-pay | Admitting: Unknown Physician Specialty

## 2018-07-05 ENCOUNTER — Encounter
Admission: RE | Disposition: A | Discharge status: Home / Self Care | Payer: Self-pay | Source: Ambulatory Visit | Attending: Unknown Physician Specialty

## 2018-07-05 DIAGNOSIS — Z800 Family history of malignant neoplasm of digestive organs: Secondary | ICD-10-CM | POA: Insufficient documentation

## 2018-07-05 DIAGNOSIS — K210 Gastro-esophageal reflux disease with esophagitis: Secondary | ICD-10-CM | POA: Diagnosis not present

## 2018-07-05 DIAGNOSIS — Z9841 Cataract extraction status, right eye: Secondary | ICD-10-CM | POA: Insufficient documentation

## 2018-07-05 DIAGNOSIS — E1140 Type 2 diabetes mellitus with diabetic neuropathy, unspecified: Secondary | ICD-10-CM | POA: Diagnosis not present

## 2018-07-05 DIAGNOSIS — Z9842 Cataract extraction status, left eye: Secondary | ICD-10-CM | POA: Insufficient documentation

## 2018-07-05 DIAGNOSIS — R002 Palpitations: Secondary | ICD-10-CM | POA: Insufficient documentation

## 2018-07-05 DIAGNOSIS — Z7984 Long term (current) use of oral hypoglycemic drugs: Secondary | ICD-10-CM | POA: Insufficient documentation

## 2018-07-05 DIAGNOSIS — Z8249 Family history of ischemic heart disease and other diseases of the circulatory system: Secondary | ICD-10-CM | POA: Insufficient documentation

## 2018-07-05 DIAGNOSIS — G8929 Other chronic pain: Secondary | ICD-10-CM | POA: Diagnosis not present

## 2018-07-05 DIAGNOSIS — I10 Essential (primary) hypertension: Secondary | ICD-10-CM | POA: Insufficient documentation

## 2018-07-05 DIAGNOSIS — Z7951 Long term (current) use of inhaled steroids: Secondary | ICD-10-CM | POA: Diagnosis not present

## 2018-07-05 DIAGNOSIS — K219 Gastro-esophageal reflux disease without esophagitis: Secondary | ICD-10-CM | POA: Diagnosis not present

## 2018-07-05 DIAGNOSIS — J45909 Unspecified asthma, uncomplicated: Secondary | ICD-10-CM | POA: Diagnosis not present

## 2018-07-05 DIAGNOSIS — F419 Anxiety disorder, unspecified: Secondary | ICD-10-CM | POA: Insufficient documentation

## 2018-07-05 DIAGNOSIS — Z8041 Family history of malignant neoplasm of ovary: Secondary | ICD-10-CM | POA: Insufficient documentation

## 2018-07-05 DIAGNOSIS — G4730 Sleep apnea, unspecified: Secondary | ICD-10-CM | POA: Diagnosis not present

## 2018-07-05 DIAGNOSIS — R05 Cough: Secondary | ICD-10-CM | POA: Diagnosis not present

## 2018-07-05 DIAGNOSIS — K7469 Other cirrhosis of liver: Secondary | ICD-10-CM | POA: Diagnosis present

## 2018-07-05 DIAGNOSIS — Z881 Allergy status to other antibiotic agents status: Secondary | ICD-10-CM | POA: Diagnosis not present

## 2018-07-05 DIAGNOSIS — F329 Major depressive disorder, single episode, unspecified: Secondary | ICD-10-CM | POA: Diagnosis not present

## 2018-07-05 DIAGNOSIS — K2970 Gastritis, unspecified, without bleeding: Secondary | ICD-10-CM | POA: Diagnosis not present

## 2018-07-05 DIAGNOSIS — K649 Unspecified hemorrhoids: Secondary | ICD-10-CM | POA: Diagnosis not present

## 2018-07-05 DIAGNOSIS — Z90710 Acquired absence of both cervix and uterus: Secondary | ICD-10-CM | POA: Diagnosis not present

## 2018-07-05 DIAGNOSIS — Z79899 Other long term (current) drug therapy: Secondary | ICD-10-CM | POA: Diagnosis not present

## 2018-07-05 DIAGNOSIS — Z880 Allergy status to penicillin: Secondary | ICD-10-CM | POA: Insufficient documentation

## 2018-07-05 DIAGNOSIS — Z888 Allergy status to other drugs, medicaments and biological substances status: Secondary | ICD-10-CM | POA: Insufficient documentation

## 2018-07-05 DIAGNOSIS — M549 Dorsalgia, unspecified: Secondary | ICD-10-CM | POA: Diagnosis not present

## 2018-07-05 DIAGNOSIS — K5790 Diverticulosis of intestine, part unspecified, without perforation or abscess without bleeding: Secondary | ICD-10-CM | POA: Diagnosis not present

## 2018-07-05 DIAGNOSIS — Z808 Family history of malignant neoplasm of other organs or systems: Secondary | ICD-10-CM | POA: Insufficient documentation

## 2018-07-05 DIAGNOSIS — M1990 Unspecified osteoarthritis, unspecified site: Secondary | ICD-10-CM | POA: Insufficient documentation

## 2018-07-05 DIAGNOSIS — Z882 Allergy status to sulfonamides status: Secondary | ICD-10-CM | POA: Insufficient documentation

## 2018-07-05 HISTORY — PX: ESOPHAGOGASTRODUODENOSCOPY (EGD) WITH PROPOFOL: SHX5813

## 2018-07-05 SURGERY — ESOPHAGOGASTRODUODENOSCOPY (EGD) WITH PROPOFOL
Anesthesia: General

## 2018-07-05 MED ORDER — PROPOFOL 10 MG/ML IV BOLUS
INTRAVENOUS | Status: DC | PRN
  Administered 2018-07-05: 30 mg via INTRAVENOUS

## 2018-07-05 MED ORDER — LIDOCAINE HCL (PF) 1 % IJ SOLN: 2.0000 mL | Freq: Once | INTRAMUSCULAR | Status: DC

## 2018-07-05 MED ORDER — IPRATROPIUM-ALBUTEROL 0.5-2.5 (3) MG/3ML IN SOLN: 3.0000 mL | Freq: Once | RESPIRATORY_TRACT | Status: DC

## 2018-07-05 MED ORDER — IPRATROPIUM-ALBUTEROL 0.5-2.5 (3) MG/3ML IN SOLN
RESPIRATORY_TRACT | Status: AC
  Filled 2018-07-05: qty 3

## 2018-07-05 MED ORDER — LIDOCAINE HCL (PF) 2 % IJ SOLN
INTRAMUSCULAR | Status: AC
  Filled 2018-07-05: qty 10

## 2018-07-05 MED ORDER — GLYCOPYRROLATE 0.2 MG/ML IJ SOLN
INTRAMUSCULAR | Status: DC | PRN
  Administered 2018-07-05: 0.2 mg via INTRAVENOUS

## 2018-07-05 MED ORDER — MIDAZOLAM HCL 2 MG/2ML IJ SOLN
INTRAMUSCULAR | Status: AC
  Filled 2018-07-05: qty 2

## 2018-07-05 MED ORDER — LIDOCAINE HCL (PF) 2 % IJ SOLN
INTRAMUSCULAR | Status: DC | PRN
  Administered 2018-07-05: 80 mg

## 2018-07-05 MED ORDER — FENTANYL CITRATE (PF) 100 MCG/2ML IJ SOLN
INTRAMUSCULAR | Status: DC | PRN
  Administered 2018-07-05 (×2): 50 ug via INTRAVENOUS

## 2018-07-05 MED ORDER — PROPOFOL 500 MG/50ML IV EMUL
INTRAVENOUS | Status: DC | PRN
  Administered 2018-07-05: 50 ug/kg/min via INTRAVENOUS

## 2018-07-05 MED ORDER — MIDAZOLAM HCL 5 MG/5ML IJ SOLN
INTRAMUSCULAR | Status: DC | PRN
  Administered 2018-07-05: 2 mg via INTRAVENOUS

## 2018-07-05 MED ORDER — SODIUM CHLORIDE 0.9 % IV SOLN: INTRAVENOUS | Status: DC

## 2018-07-05 MED ORDER — GLYCOPYRROLATE 0.2 MG/ML IJ SOLN
INTRAMUSCULAR | Status: AC
  Filled 2018-07-05: qty 1

## 2018-07-05 MED ORDER — LIDOCAINE HCL (PF) 1 % IJ SOLN
INTRAMUSCULAR | Status: AC
  Filled 2018-07-05: qty 2

## 2018-07-05 MED ORDER — FENTANYL CITRATE (PF) 100 MCG/2ML IJ SOLN
INTRAMUSCULAR | Status: AC
  Filled 2018-07-05: qty 2

## 2018-07-05 MED ORDER — SODIUM CHLORIDE 0.9 % IV SOLN
INTRAVENOUS | Status: DC
  Administered 2018-07-05: 09:00:00 via INTRAVENOUS

## 2018-07-05 NOTE — H&P (Signed)
Primary Care Physician:  Maryland Pink, MD Primary Gastroenterologist:  Dr. Vira Agar  Pre-Procedure History & Physical: HPI:  Laura [name] is a 67 y.o. female is here for an endoscopy.  Done for non alcoholic cirrhosis.   Past Medical History:  Diagnosis Date  . Allergy   . Anxiety   . Asthma   . Chronic back pain   . Chronic cough 07/01/10   PFT  FEV1 2.20 (93%), FEV 1% 81, TLC 4,12 (81%), DLCO 78%, no BD. normal chest CT, sinus 07/08/10  . Depression   . Diabetes mellitus without complication ([place])   . Diabetic neuropathy ([place])   . Diverticulosis   . Dysrhythmia    H/O PALPITATIONS-SINCE ABLATION IN 2013 AT [facility], PALPITATIONS MUCH BETTER  . GERD (gastroesophageal reflux disease)    NO MEDS  . Hemorrhoids   . HTN (hypertension)   . Neuropathy ([place])   . Osteoarthritis   . Sleep apnea    NO CPAP    Past Surgical History:  Procedure Laterality Date  . BRONCHOSCOPY  07/25/10  . CARDIAC ELECTROPHYSIOLOGY STUDY AND ABLATION    . CARPAL TUNNEL RELEASE    . CATARACT EXTRACTION Bilateral Jan 2017  . cathater ablation     for arrhythmia  . COLONOSCOPY  2014  . FOOT SURGERY    . HEMORROIDECTOMY    . LIPOMA EXCISION Right 10/20/2016   Procedure: EXCISION LIPOMA;  Surgeon: Christene Lye, MD;  Location: [facility] ORS;  Service: General;  Laterality: Right;  . PARTIAL HYSTERECTOMY    . TOENAIL EXCISION Right     Prior to Admission medications   Medication Sig Start Date End Date Taking? Authorizing Provider  albuterol (PROVENTIL HFA;VENTOLIN HFA) 108 (90 Base) MCG/ACT inhaler Inhale 2 puffs into the lungs every 6 (six) hours as needed for wheezing or shortness of breath.    [provider]  DULoxetine (CYMBALTA) 30 MG capsule Limit 2 capsules by mouth per day if tolerated Patient taking differently: Take 60 mg by mouth 2 (two) times daily. Limit 2 capsules by mouth per day if tolerated 08/18/16   Mohammed Kindle, MD  gabapentin (NEURONTIN) 300 MG capsule Limit  5- 8 capsules  per day if tolerated Patient taking differently: Take 600 mg by mouth 4 (four) times daily. Limit 5- 8 capsules  per day if tolerated 08/18/16   Mohammed Kindle, MD  glipiZIDE (GLUCOTROL XL) 5 MG 24 hr tablet Take 5 mg by mouth 2 (two) times daily.  01/29/16   [provider]  lisinopril (PRINIVIL,ZESTRIL) 10 MG tablet Take 10 mg by mouth every morning. Reported on 06/02/2016 02/16/16   [provider]  metFORMIN (GLUCOPHAGE) 1000 MG tablet Take 1,000 mg by mouth 2 (two) times daily with a meal.    [provider]  pregabalin (LYRICA) 50 MG capsule Take 50 mg by mouth 2 (two) times daily.    [provider]  traZODone (DESYREL) 50 MG tablet Limit 1 tablet by mouth at bedtime when necessary for insomnia if tolerated 08/18/16   Mohammed Kindle, MD    Allergies as of 05/05/2018 - Review Complete 11/04/2016  Allergen Reaction Noted  . Atenolol Palpitations 11/21/2015  . Clarithromycin Other (See Comments) and Nausea And Vomiting 06/04/2010  . Doxycycline Palpitations 06/04/2010  . Sulfa antibiotics Nausea Only 02/28/2016  . Erythromycin base Nausea And Vomiting 02/28/2016  . Penicillins Rash 06/04/2010    Family History  Problem Relation Age of Onset  . Cancer Mother  stomach  . Cancer Father        brain  . Heart disease Father   . Cancer Brother        mouth  . Heart disease Brother   . Ovarian cancer Maternal Grandmother     Social History   Socioeconomic History  . Marital status: Married    Spouse name: Not on file  . Number of children: Not on file  . Years of education: Not on file  . Highest education level: Not on file  Occupational History  . Not on file  Social Needs  . Financial resource strain: Not on file  . Food insecurity:    Worry: Not on file    Inability: Not on file  . Transportation needs:    Medical: Not on file    Non-medical: Not on file  Tobacco Use  . Smoking status: Never Smoker  . Smokeless  tobacco: Never Used  Substance and Sexual Activity  . Alcohol use: No    Alcohol/week: 0.0 oz  . Drug use: No  . Sexual activity: Not on file  Lifestyle  . Physical activity:    Days per week: Not on file    Minutes per session: Not on file  . Stress: Not on file  Relationships  . Social connections:    Talks on phone: Not on file    Gets together: Not on file    Attends religious service: Not on file    Active member of club or organization: Not on file    Attends meetings of clubs or organizations: Not on file    Relationship status: Not on file  . Intimate partner violence:    Fear of current or ex partner: Not on file    Emotionally abused: Not on file    Physically abused: Not on file    Forced sexual activity: Not on file  Other Topics Concern  . Not on file  Social History Narrative   Married, 2 children. Works in day care center   Cell: [phone]     Review of Systems: See HPI, otherwise negative ROS  Physical Exam: BP (!) 145/79   Pulse 62   Temp (!) 96 F (35.6 C) (Tympanic)   Resp 16   Ht 5\' 4"  (1.626 m)   Wt 83.5 kg (184 lb)   SpO2 95%   BMI 31.58 kg/m  General:   Alert,  pleasant and cooperative in NAD Head:  Normocephalic and atraumatic. Neck:  Supple; no masses or thyromegaly. Lungs:  Clear throughout to auscultation.    Heart:  Regular rate and rhythm. Abdomen:  Soft, nontender and nondistended. Normal bowel sounds, without guarding, and without rebound.   Neurologic:  Alert and  oriented x4;  grossly normal neurologically.  Impression/Plan: Laura [name] is here for an endoscopy to be performed for non alcoholic cirrhosis,  Risks, benefits, limitations, and alternatives regarding  endoscopy have been reviewed with the patient.  Questions have been answered.  All parties agreeable.   Gaylyn Cheers, MD  07/05/2018, 9:18 AM

## 2018-07-05 NOTE — Transfer of Care (Signed)
Immediate Anesthesia Transfer of Care Note  Patient: Laura [name]  Procedure(s) Performed: ESOPHAGOGASTRODUODENOSCOPY (EGD) WITH PROPOFOL (N/A )  Patient Location: PACU  Anesthesia Type:General  Level of Consciousness: sedated  Airway & Oxygen Therapy: Patient Spontanous Breathing and Patient connected to nasal cannula oxygen  Post-op Assessment: Report given to RN and Post -op Vital signs reviewed and stable  Post vital signs: Reviewed and stable  Last Vitals:  Vitals Value Taken Time  BP 145/74 07/05/2018  9:31 AM  Temp    Pulse 80 07/05/2018  9:31 AM  Resp 10 07/05/2018  9:31 AM  SpO2 95 % 07/05/2018  9:31 AM  Vitals shown include unvalidated device data.  Last Pain:  Vitals:   07/05/18 0827  TempSrc: Tympanic  PainSc: 0-No pain         Complications: No apparent anesthesia complications

## 2018-07-05 NOTE — Anesthesia Post-op Follow-up Note (Signed)
Anesthesia QCDR form completed.        

## 2018-07-05 NOTE — Anesthesia Preprocedure Evaluation (Signed)
Anesthesia Evaluation  Patient identified by MRN, date of birth, ID band Patient awake    Reviewed: Allergy & Precautions, H&P , NPO status , Patient's Chart, lab work & pertinent test results  History of Anesthesia Complications Negative for: history of anesthetic complications  Airway Mallampati: III  TM Distance: <3 FB Neck ROM: limited    Dental  (+) Chipped, Poor Dentition, Missing, Caps   Pulmonary neg shortness of breath, asthma , sleep apnea ,           Cardiovascular Exercise Tolerance: Good hypertension, (-) angina(-) Past MI and (-) DOE + dysrhythmias      Neuro/Psych PSYCHIATRIC DISORDERS Anxiety Depression  Neuromuscular disease    GI/Hepatic Neg liver ROS, GERD  Medicated and Controlled,  Endo/Other  diabetes, Type 2  Renal/GU negative Renal ROS  negative genitourinary   Musculoskeletal   Abdominal   Peds  Hematology negative hematology ROS (+)   Anesthesia Other Findings Past Medical History: No date: Allergy No date: Anxiety No date: Asthma No date: Chronic back pain 07/01/10: Chronic cough     Comment:  PFT  FEV1 2.20 (93%), FEV 1% 81, TLC 4,12 (81%), DLCO               78%, no BD. normal chest CT, sinus 07/08/10 No date: Depression No date: Diabetes mellitus without complication ([facility]) No date: Diabetic neuropathy ([facility]) No date: Diverticulosis No date: Dysrhythmia     Comment:  H/O PALPITATIONS-SINCE ABLATION IN 2013 AT [facility],               PALPITATIONS MUCH BETTER No date: GERD (gastroesophageal reflux disease)     Comment:  NO MEDS No date: Hemorrhoids No date: HTN (hypertension) No date: Neuropathy ([place]) No date: Osteoarthritis No date: Sleep apnea     Comment:  NO CPAP  Past Surgical History: 07/25/10: BRONCHOSCOPY No date: CARDIAC ELECTROPHYSIOLOGY STUDY AND ABLATION No date: CARPAL TUNNEL RELEASE Jan 2017: CATARACT EXTRACTION; Bilateral No date: cathater ablation     Comment:   for arrhythmia 2014: COLONOSCOPY No date: FOOT SURGERY No date: HEMORROIDECTOMY 10/20/2016: LIPOMA EXCISION; Right     Comment:  Procedure: EXCISION LIPOMA;  Surgeon: Christene Lye, MD;  Location: [facility] ORS;  Service: General;                Laterality: Right; No date: PARTIAL HYSTERECTOMY No date: TOENAIL EXCISION; Right  BMI    Body Mass Index:  31.58 kg/m      Reproductive/Obstetrics negative OB ROS                             Anesthesia Physical Anesthesia Plan  ASA: III  Anesthesia Plan: General   Post-op Pain Management:    Induction: Intravenous  PONV Risk Score and Plan: Propofol infusion and TIVA  Airway Management Planned: Natural Airway and Nasal Cannula  Additional Equipment:   Intra-op Plan:   Post-operative Plan:   Informed Consent: I have reviewed the patients History and Physical, chart, labs and discussed the procedure including the risks, benefits and alternatives for the proposed anesthesia with the patient or authorized representative who has indicated his/her understanding and acceptance.   Dental Advisory Given  Plan Discussed with: Anesthesiologist, CRNA and Surgeon  Anesthesia Plan Comments: (Patient consented for risks of anesthesia including but not limited to:  - adverse reactions to medications -  risk of intubation if required - damage to teeth, lips or other oral mucosa - sore throat or hoarseness - Damage to heart, brain, lungs or loss of life  Patient voiced understanding.)        Anesthesia Quick Evaluation

## 2018-07-05 NOTE — Op Note (Signed)
[facility] Patient Name: Laura [name] Procedure Date: 07/05/2018 9:08 AM MRN: [id number] Account #: [id number] Date of Birth: [birth year] Admit Type: Outpatient Age: 67 Room: [facility] ENDO ROOM 1 Gender: Female Note Status: Finalized Procedure:            Upper GI endoscopy Indications:          Cirrhosis non alcoholic Providers:            Manya Silvas, MD Referring MD:         Irven Easterly. Kary Kos, MD (Referring MD) Medicines:            Propofol per Anesthesia Complications:        No immediate complications. Procedure:            Pre-Anesthesia Assessment:                       - After reviewing the risks and benefits, the patient                        was deemed in satisfactory condition to undergo the                        procedure.                       After obtaining informed consent, the endoscope was                        passed under direct vision. Throughout the procedure,                        the patient's blood pressure, pulse, and oxygen                        saturations were monitored continuously. The Endoscope                        was introduced through the mouth, and advanced to the                        second part of duodenum. The upper GI endoscopy was                        accomplished without difficulty. The patient tolerated                        the procedure well. Findings:      The examined esophagus was normal. GEJ showed focal inflammation      LA Grade A (one or more mucosal breaks less than 5 mm, not extending       between tops of 2 mucosal folds) esophagitis with no bleeding was found       40 cm from the incisors.      Diffuse mild inflammation characterized by erythema and granularity was       found in the gastric body and in the gastric antrum. Likiely due to       portal hypertension.      The examined duodenum was normal. Impression:           - Normal esophagus.                       -  LA  Grade A reflux esophagitis.                       - Gastritis.                       - Normal examined duodenum.                       - No specimens collected. Recommendation:       - The findings and recommendations were discussed with                        the patient's family. Manya Silvas, MD 07/05/2018 9:33:23 AM This report has been signed electronically. Number of Addenda: 0 Note Initiated On: 07/05/2018 9:08 AM      [facility]

## 2018-07-05 NOTE — Anesthesia Postprocedure Evaluation (Signed)
Anesthesia Post Note  Patient: Laura [name]  Procedure(s) Performed: ESOPHAGOGASTRODUODENOSCOPY (EGD) WITH PROPOFOL (N/A )  Patient location during evaluation: Endoscopy Anesthesia Type: General Level of consciousness: awake and alert Pain management: pain level controlled Vital Signs Assessment: post-procedure vital signs reviewed and stable Respiratory status: spontaneous breathing, nonlabored ventilation, respiratory function stable and patient connected to nasal cannula oxygen Cardiovascular status: blood pressure returned to baseline and stable Postop Assessment: no apparent nausea or vomiting Anesthetic complications: no     Last Vitals:  Vitals:   07/05/18 0931 07/05/18 0947  BP: (!) 145/74   Pulse:    Resp: 16 16  Temp: (!) 35.9 C   SpO2:  94%    Last Pain:  Vitals:   07/05/18 1011  TempSrc:   PainSc: 0-No pain                 Precious Haws Yari Szeliga

## 2018-07-19 ENCOUNTER — Ambulatory Visit
Admission: RE | Admit: 2018-07-19 | Discharge: 2018-07-19 | Disposition: A | Discharge status: Home / Self Care | Payer: Medicare Other | Source: Ambulatory Visit | Attending: Nurse Practitioner | Admitting: Nurse Practitioner

## 2018-07-19 ENCOUNTER — Ambulatory Visit: Payer: Medicare Other

## 2018-07-19 DIAGNOSIS — D739 Disease of spleen, unspecified: Secondary | ICD-10-CM | POA: Insufficient documentation

## 2018-07-19 DIAGNOSIS — K760 Fatty (change of) liver, not elsewhere classified: Secondary | ICD-10-CM | POA: Insufficient documentation

## 2018-07-19 DIAGNOSIS — D7389 Other diseases of spleen: Secondary | ICD-10-CM

## 2018-07-19 DIAGNOSIS — K7460 Unspecified cirrhosis of liver: Secondary | ICD-10-CM | POA: Diagnosis present

## 2018-11-01 ENCOUNTER — Ambulatory Visit
Admission: RE | Admit: 2018-11-01 | Discharge: 2018-11-01 | Disposition: A | Discharge status: Home / Self Care | Payer: Medicare Other | Source: Ambulatory Visit | Attending: Cardiology | Admitting: Cardiology

## 2018-11-01 ENCOUNTER — Other Ambulatory Visit: Payer: Self-pay | Admitting: Cardiology

## 2018-11-01 ENCOUNTER — Ambulatory Visit
Admission: RE | Admit: 2018-11-01 | Discharge: 2018-11-01 | Disposition: A | Discharge status: Home / Self Care | Payer: Medicare Other | Source: Ambulatory Visit | Attending: Internal Medicine | Admitting: Internal Medicine

## 2018-11-01 DIAGNOSIS — R079 Chest pain, unspecified: Secondary | ICD-10-CM

## 2019-01-18 ENCOUNTER — Other Ambulatory Visit: Payer: Self-pay | Admitting: Nurse Practitioner

## 2019-01-18 DIAGNOSIS — K760 Fatty (change of) liver, not elsewhere classified: Secondary | ICD-10-CM

## 2019-01-18 DIAGNOSIS — K7460 Unspecified cirrhosis of liver: Secondary | ICD-10-CM

## 2019-07-20 ENCOUNTER — Other Ambulatory Visit: Payer: Self-pay | Admitting: Nurse Practitioner

## 2019-07-20 DIAGNOSIS — K7460 Unspecified cirrhosis of liver: Secondary | ICD-10-CM

## 2019-07-26 ENCOUNTER — Emergency Department: Payer: Medicare Other

## 2019-07-26 ENCOUNTER — Emergency Department
Admission: EM | Admit: 2019-07-26 | Discharge: 2019-07-26 | Disposition: A | Discharge status: Home / Self Care | Payer: Medicare Other | Attending: Emergency Medicine | Admitting: Emergency Medicine

## 2019-07-26 ENCOUNTER — Other Ambulatory Visit: Payer: Self-pay

## 2019-07-26 ENCOUNTER — Ambulatory Visit
Admission: RE | Admit: 2019-07-26 | Discharge: 2019-07-26 | Disposition: A | Discharge status: Home / Self Care | Payer: Medicare Other | Source: Ambulatory Visit | Attending: Nurse Practitioner | Admitting: Nurse Practitioner

## 2019-07-26 ENCOUNTER — Encounter: Payer: Self-pay | Admitting: Medical Oncology

## 2019-07-26 DIAGNOSIS — R55 Syncope and collapse: Secondary | ICD-10-CM | POA: Diagnosis present

## 2019-07-26 DIAGNOSIS — J45909 Unspecified asthma, uncomplicated: Secondary | ICD-10-CM | POA: Diagnosis not present

## 2019-07-26 DIAGNOSIS — N390 Urinary tract infection, site not specified: Secondary | ICD-10-CM

## 2019-07-26 DIAGNOSIS — E119 Type 2 diabetes mellitus without complications: Secondary | ICD-10-CM | POA: Insufficient documentation

## 2019-07-26 DIAGNOSIS — Z7984 Long term (current) use of oral hypoglycemic drugs: Secondary | ICD-10-CM | POA: Insufficient documentation

## 2019-07-26 DIAGNOSIS — I10 Essential (primary) hypertension: Secondary | ICD-10-CM | POA: Insufficient documentation

## 2019-07-26 DIAGNOSIS — Z79899 Other long term (current) drug therapy: Secondary | ICD-10-CM | POA: Diagnosis not present

## 2019-07-26 DIAGNOSIS — K7460 Unspecified cirrhosis of liver: Secondary | ICD-10-CM | POA: Insufficient documentation

## 2019-07-26 DIAGNOSIS — R531 Weakness: Secondary | ICD-10-CM | POA: Diagnosis not present

## 2019-07-26 LAB — TROPONIN I (HIGH SENSITIVITY)
Troponin I (High Sensitivity): 4 ng/L (ref <18)
Troponin I (High Sensitivity): 4 ng/L (ref <18)

## 2019-07-26 LAB — BASIC METABOLIC PANEL
Anion gap: 6 (ref 5–15)
BUN: 18 mg/dL (ref 8–23)
CO2: 27 mmol/L (ref 22–32)
Calcium: 9.1 mg/dL (ref 8.9–10.3)
Chloride: 106 mmol/L (ref 98–111)
Creatinine, Ser: 0.81 mg/dL (ref 0.44–1.00)
GFR calc Af Amer: >60 mL/min (ref >60)
GFR calc non Af Amer: >60 mL/min (ref >60)
Glucose, Bld: 129 mg/dL — ABNORMAL HIGH (ref 70–99)
Potassium: 4.3 mmol/L (ref 3.5–5.1)
Sodium: 139 mmol/L (ref 135–145)

## 2019-07-26 LAB — URINALYSIS, COMPLETE (UACMP) WITH MICROSCOPIC
Bacteria, UA: NONE SEEN
Bilirubin Urine: NEGATIVE
Glucose, UA: NEGATIVE mg/dL
Hgb urine dipstick: NEGATIVE
Ketones, ur: NEGATIVE mg/dL
Nitrite: NEGATIVE
Protein, ur: NEGATIVE mg/dL
Specific Gravity, Urine: 1.018 (ref 1.005–1.030)
pH: 5 (ref 5.0–8.0)

## 2019-07-26 LAB — CBC
HCT: 40.8 % (ref 36.0–46.0)
Hemoglobin: 13 g/dL (ref 12.0–15.0)
MCH: 26 pg (ref 26.0–34.0)
MCHC: 31.9 g/dL (ref 30.0–36.0)
MCV: 81.6 fL (ref 80.0–100.0)
Platelets: 158 10*3/uL (ref 150–400)
RBC: 5 MIL/uL (ref 3.87–5.11)
RDW: 14.4 % (ref 11.5–15.5)
WBC: 6.6 10*3/uL (ref 4.0–10.5)
nRBC: 0 % (ref 0.0–0.2)

## 2019-07-26 LAB — GLUCOSE, CAPILLARY: Glucose-Capillary: 118 mg/dL — ABNORMAL HIGH (ref 70–99)

## 2019-07-26 MED ORDER — CEPHALEXIN 500 MG PO CAPS: 500.0000 mg | ORAL_CAPSULE | Freq: Three times a day (TID) | ORAL | 0 refills | Status: AC

## 2019-07-26 NOTE — ED Notes (Signed)
Called daughter, Tomi Bamberger, informing her she can come to room to see pt.

## 2019-07-26 NOTE — ED Notes (Signed)
Pt [place]. States was walking out on side walk and had syncopal episode. Daughter brought pt to ED to be checked out. Type 2 DM, ate breakfast this AM. Hx cirrhosis. No new medication changes. States has been having syncopal episodes lately, states "this happens from time to time" but states has started to become more frequent. A&O, speaking in complete sentences. No distress noted. Was ambulatory from wheelchair to ED stretcher.

## 2019-07-26 NOTE — ED Notes (Signed)
Called lab to add on troponin to blood work sent down previously.

## 2019-07-26 NOTE — Discharge Instructions (Signed)
Follow-up with your cardiologist without fail tomorrow, they are expecting your call.  Return to the emergency room if you change your mind about being admitted or you have any new or worrisome symptoms.  It does appear that you might have a small urinary tract infection we will send you with antibiotics pending culture.  If you feel worse in any way, return to the ER.  Be very careful walking about, take time to equilibrium before you walk.  Also you may wish to discuss with your primary care doctor reducing propranolol dosing

## 2019-07-26 NOTE — ED Provider Notes (Addendum)
[facility] Emergency Department Provider Note  ____________________________________________   I have reviewed the triage vital signs and the nursing notes. Where available I have reviewed prior notes and, if possible and indicated, outside hospital notes.   Patient seen and evaluated during the coronavirus epidemic during a time with low staffing  Patient seen for the symptoms described in the history of present illness. She was evaluated in the context of the global COVID-19 pandemic, which necessitated consideration that the patient might be at risk for infection with the SARS-CoV-2 virus that causes COVID-19. Institutional protocols and algorithms that pertain to the evaluation of patients at risk for COVID-19 are in a state of rapid change based on information released by regulatory bodies including the CDC and federal and state organizations. These policies and algorithms were followed during the patient's care in the ED.    HISTORY  Chief Complaint Loss of Consciousness, Fall, Arm Pain, and Knee Injury    HPI Laura [name] is a 68 y.o. female  With a history of allergies, arthritis, asthma, chronic back pain, depression, diabetic neuropathy diabetes, states that for the last year or so she is been having episodes where she feels lightheaded.  Sometimes it happens as frequently as twice a day.  Seems to be happening more frequently recently.  Today, she was walking and she felt that her toe got caught on the concrete and she fell she did not hit her head she does not think.  She did not pass out, she remembers hitting the ground.  However, she did feel a little "lightheaded" and at this time is now back to baseline.  No chest pain no exertional symptoms otherwise.  These symptoms of lightheadedness can happen at any time whether she is sitting at rest or walking around.  She states years ago she had a history of palpitations and she still sometimes gets them but she  does not associate them with these processes which are varying intensity and very variable presentation.  At this time she has no complaints except for she has a slight abrasion to her knee.  She is able to ambulate she does not feel the knee is broken and she does not want an x-ray.  States Tdap is UTD   Past Medical History:  Diagnosis Date  . Allergy   . Anxiety   . Asthma   . Chronic back pain   . Chronic cough 07/01/10   PFT  FEV1 2.20 (93%), FEV 1% 81, TLC 4,12 (81%), DLCO 78%, no BD. normal chest CT, sinus 07/08/10  . Depression   . Diabetes mellitus without complication ([place])   . Diabetic neuropathy ([place])   . Diverticulosis   . Dysrhythmia    H/O PALPITATIONS-SINCE ABLATION IN 2013 AT [facility], PALPITATIONS MUCH BETTER  . GERD (gastroesophageal reflux disease)    NO MEDS  . Hemorrhoids   . HTN (hypertension)   . Neuropathy   . Osteoarthritis   . Sleep apnea    NO CPAP    Patient Active Problem List   Diagnosis Date Noted  . Complex regional pain syndrome 07/21/2016  . Lumbar radiculopathy 07/21/2016  . DDD (degenerative disc disease), lumbar 07/02/2016  . Facet syndrome, lumbar 07/02/2016  . Allergic state 02/28/2016  . Arthritis 02/28/2016  . Acid reflux 02/28/2016  . BP (high blood pressure) 02/28/2016  . Chronic painful diabetic neuropathy ([place]) 02/05/2016  . Difficulty sleeping 02/05/2016  . Diabetic neuropathy ([place]) 01/31/2016  . Reflux 11/19/2011  .  GERD 09/19/2010  . OTHER DISEASES OF TRACHEA AND BRONCHUS 08/14/2010  . FATTY LIVER DISEASE 07/22/2010  . Cough 06/04/2010  . ANXIETY DEPRESSION 05/18/2009  . HYPERTENSION, UNSPECIFIED 05/18/2009  . PALPITATIONS 05/18/2009    Past Surgical History:  Procedure Laterality Date  . BRONCHOSCOPY  07/25/10  . CARDIAC ELECTROPHYSIOLOGY STUDY AND ABLATION    . CARPAL TUNNEL RELEASE    . CATARACT EXTRACTION Bilateral Jan 2017  . cathater ablation     for arrhythmia  . COLONOSCOPY  2014  .  ESOPHAGOGASTRODUODENOSCOPY (EGD) WITH PROPOFOL N/A 07/05/2018   Procedure: ESOPHAGOGASTRODUODENOSCOPY (EGD) WITH PROPOFOL;  Surgeon: Manya Silvas, MD;  Location: [facility] ENDOSCOPY;  Service: Endoscopy;  Laterality: N/A;  . FOOT SURGERY    . HEMORROIDECTOMY    . LIPOMA EXCISION Right 10/20/2016   Procedure: EXCISION LIPOMA;  Surgeon: Christene Lye, MD;  Location: [facility] ORS;  Service: General;  Laterality: Right;  . PARTIAL HYSTERECTOMY    . TOENAIL EXCISION Right     Prior to Admission medications   Medication Sig Start Date End Date Taking? Authorizing Provider  albuterol (PROVENTIL HFA;VENTOLIN HFA) 108 (90 Base) MCG/ACT inhaler Inhale 2 puffs into the lungs every 6 (six) hours as needed for wheezing or shortness of breath.    [provider]  DULoxetine (CYMBALTA) 30 MG capsule Limit 2 capsules by mouth per day if tolerated Patient taking differently: Take 60 mg by mouth 2 (two) times daily. Limit 2 capsules by mouth per day if tolerated 08/18/16   Mohammed Kindle, MD  gabapentin (NEURONTIN) 300 MG capsule Limit 5- 8 capsules  per day if tolerated Patient taking differently: Take 600 mg by mouth 4 (four) times daily. Limit 5- 8 capsules  per day if tolerated 08/18/16   Mohammed Kindle, MD  glipiZIDE (GLUCOTROL XL) 5 MG 24 hr tablet Take 5 mg by mouth 2 (two) times daily.  01/29/16   [provider]  lisinopril (PRINIVIL,ZESTRIL) 10 MG tablet Take 10 mg by mouth every morning. Reported on 06/02/2016 02/16/16   [provider]  metFORMIN (GLUCOPHAGE) 1000 MG tablet Take 1,000 mg by mouth 2 (two) times daily with a meal.    [provider]  pregabalin (LYRICA) 50 MG capsule Take 50 mg by mouth 2 (two) times daily.    [provider]  traZODone (DESYREL) 50 MG tablet Limit 1 tablet by mouth at bedtime when necessary for insomnia if tolerated 08/18/16   Mohammed Kindle, MD    Allergies Atenolol, Clarithromycin, Doxycycline, Sulfa antibiotics,  Erythromycin base, and Penicillins  Family History  Problem Relation Age of Onset  . Cancer Mother        stomach  . Cancer Father        brain  . Heart disease Father   . Cancer Brother        mouth  . Heart disease Brother   . Ovarian cancer Maternal Grandmother     Social History Social History   Tobacco Use  . Smoking status: Never Smoker  . Smokeless tobacco: Never Used  Substance Use Topics  . Alcohol use: No    Alcohol/week: 0.0 standard drinks  . Drug use: No    Review of Systems Constitutional: No fever/chills Eyes: No visual changes. ENT: No sore throat. No stiff neck no neck pain Cardiovascular: Denies chest pain. Respiratory: Denies shortness of breath. Gastrointestinal:   no vomiting.  No diarrhea.  No constipation. Genitourinary: Negative for dysuria. Musculoskeletal: Negative lower extremity swelling Skin: Negative for  rash. Neurological: Negative for severe headaches, focal weakness or numbness.   ____________________________________________   PHYSICAL EXAM:  VITAL SIGNS: ED Triage Vitals  Enc Vitals Group     BP 07/26/19 0957 (!) 170/77     Pulse Rate 07/26/19 0957 (!) 59     Resp 07/26/19 0957 18     Temp 07/26/19 0957 98.2 F (36.8 C)     Temp Source 07/26/19 0957 Oral     SpO2 07/26/19 0957 96 %     Weight 07/26/19 0958 177 lb (80.3 kg)     Height 07/26/19 0958 5\' 5"  (1.651 m)     Head Circumference --      Peak Flow --      Pain Score 07/26/19 0957 6     Pain Loc --      Pain Edu? --      Excl. in [place]? --     Constitutional: Alert and oriented. Well appearing and in no acute distress. Eyes: Conjunctivae are normal Head: Atraumatic HEENT: No congestion/rhinnorhea. Mucous membranes are moist.  Oropharynx non-erythematous Neck:   Nontender with no meningismus, no masses, no stridor Cardiovascular: Normal rate, regular rhythm. Grossly normal heart sounds.  Good peripheral circulation. Respiratory: Normal respiratory effort.  No  retractions. Lungs CTAB. Abdominal: Soft and nontender. No distention. No guarding no rebound Back:  There is no focal tenderness or step off.  there is no midline tenderness there are no lesions noted. there is no CVA tenderness Musculoskeletal: No lower extremity tenderness, no upper extremity tenderness. No joint effusions, no DVT signs strong distal pulses no edema full painless range of motion of all major joints with no evidence of injury Neurologic:  Normal speech and language. No gross focal neurologic deficits are appreciated.  Skin:  Skin is warm, dry and intact. No rash noted.  Very slight superficial abrasions noted to the right knee Psychiatric: Mood and affect are normal. Speech and behavior are normal.  ____________________________________________   LABS (all labs ordered are listed, but only abnormal results are displayed)  Labs Reviewed  BASIC METABOLIC PANEL - Abnormal; Notable for the following components:      Result Value   Glucose, Bld 129 (*)    All other components within normal limits  GLUCOSE, CAPILLARY - Abnormal; Notable for the following components:   Glucose-Capillary 118 (*)    All other components within normal limits  CBC  URINALYSIS, COMPLETE (UACMP) WITH MICROSCOPIC  CBG MONITORING, ED  TROPONIN I (HIGH SENSITIVITY)    Pertinent labs  results that were available during my care of the patient were reviewed by me and considered in my medical decision making (see chart for details). ____________________________________________  EKG  I personally interpreted any EKGs ordered by me or triage Sinus rhythm rate 58 bpm, borderline LAD no acute ST elevation or depression unremarkable EKG, QTC is 473 ____________________________________________  RADIOLOGY  Pertinent labs & imaging results that were available during my care of the patient were reviewed by me and considered in my medical decision making (see chart for details). If possible, patient and/or  family made aware of any abnormal findings.   ____________________________________________    PROCEDURES  Procedure(s) performed: None  Procedures  Critical Care performed: None  ____________________________________________   INITIAL IMPRESSION / ASSESSMENT AND PLAN / ED COURSE  Pertinent labs & imaging results that were available during my care of the patient were reviewed by me and considered in my medical decision making (see chart for details).   Well-appearing  woman who had a fall today which in retrospect she feels may have had some presyncopal components insofar as  she frequently has presyncopal symptoms.  She is in no acute distress here her vital signs are reassuring, this happened today after she got an ultrasound for her liver issues.  Patient has been having 2 episodes of lightheadedness a day for the last month and off and on for the last year she is followed by cardiology but is not yet told him about this.  Her preference would be to go home if possible.  She has no evidence of acute injury from her fall.  Work-up is unremarkable otherwise. She has no new medications they did did switch her propranolol to evening a few months ago because of these episodes but does not seem that it is helped.  ----------------------------------------- 1:38 PM on 07/26/2019 -----------------------------------------  Patient in no acute distress, feels better, actually never felt bad while she was here.  Vital signs are noted.  May need to go back on her propranolol.  I talked to Dr. Clayborn Bigness, who is on-call for Dr. Fletcher Anon her cardiologist.  They do not feel that she needs to be admitted to the hospital at this time.  They will however follow closely with her as an outpatient for Holter monitoring and further evaluation of her lightheaded spells which have been coming and going for a year.  Patient declines offered admission.  I did advise that if she did stay in the hospital we can monitor  her more closely and intervene more rapidly if something were to happen but she would like to go home this is not unreasonable I think given the chronicity of complaint but she does understand that with her advice.  She does appear to have a mild urinary tract infection, we will send a urine culture and start her on some antibiotics.  Return precautions follow-up given and understood.     ____________________________________________   FINAL CLINICAL IMPRESSION(S) / ED DIAGNOSES  Final diagnoses:  Weakness      This chart was dictated using voice recognition software.  Despite best efforts to proofread,  errors can occur which can change meaning.      Schuyler Amor, MD 07/26/19 1203    Schuyler Amor, MD 07/26/19 1204    Schuyler Amor, MD 07/26/19 1339

## 2019-07-26 NOTE — ED Notes (Signed)
CBG 118. BP on R arm 182/46.

## 2019-07-26 NOTE — ED Triage Notes (Signed)
Pt was here in medical mall to have [place], was leaving area and had a syncopal episode, falling to the ground, pt reports LOC. States that she has been having issues with her blood sugars dropping. C/O rt knee and rt arm pain.

## 2019-07-26 NOTE — ED Notes (Signed)
Pt taken to CT via stretcher.

## 2019-07-28 ENCOUNTER — Ambulatory Visit (LOCAL_COMMUNITY_HEALTH_CENTER): Payer: Medicare Other

## 2019-07-28 ENCOUNTER — Other Ambulatory Visit: Payer: Self-pay

## 2019-07-28 DIAGNOSIS — Z23 Encounter for immunization: Secondary | ICD-10-CM | POA: Diagnosis not present

## 2019-07-28 LAB — URINE CULTURE: Culture: 60000 — AB

## 2019-08-01 ENCOUNTER — Other Ambulatory Visit: Payer: Self-pay | Admitting: Nurse Practitioner

## 2019-08-01 DIAGNOSIS — K828 Other specified diseases of gallbladder: Secondary | ICD-10-CM

## 2019-08-01 DIAGNOSIS — R932 Abnormal findings on diagnostic imaging of liver and biliary tract: Secondary | ICD-10-CM

## 2019-08-09 ENCOUNTER — Encounter
Admission: RE | Admit: 2019-08-09 | Discharge: 2019-08-09 | Disposition: A | Discharge status: Home / Self Care | Payer: Medicare Other | Source: Ambulatory Visit | Attending: Nurse Practitioner | Admitting: Nurse Practitioner

## 2019-08-09 ENCOUNTER — Other Ambulatory Visit: Payer: Self-pay

## 2019-08-09 DIAGNOSIS — R932 Abnormal findings on diagnostic imaging of liver and biliary tract: Secondary | ICD-10-CM | POA: Diagnosis present

## 2019-08-09 DIAGNOSIS — K828 Other specified diseases of gallbladder: Secondary | ICD-10-CM | POA: Insufficient documentation

## 2019-08-09 MED ORDER — TECHNETIUM TC 99M MEBROFENIN IV KIT
5.0000 | PACK | Freq: Once | INTRAVENOUS | Status: AC | PRN
  Administered 2019-08-09: 5.31 via INTRAVENOUS

## 2019-09-02 ENCOUNTER — Other Ambulatory Visit
Admission: RE | Admit: 2019-09-02 | Discharge: 2019-09-02 | Disposition: A | Discharge status: Home / Self Care | Payer: Medicare Other | Source: Ambulatory Visit | Attending: Internal Medicine | Admitting: Internal Medicine

## 2019-09-02 DIAGNOSIS — Z01812 Encounter for preprocedural laboratory examination: Secondary | ICD-10-CM | POA: Diagnosis present

## 2019-09-02 DIAGNOSIS — Z20828 Contact with and (suspected) exposure to other viral communicable diseases: Secondary | ICD-10-CM | POA: Insufficient documentation

## 2019-09-03 LAB — SARS CORONAVIRUS 2 (TAT 6-24 HRS): SARS Coronavirus 2: NEGATIVE

## 2019-09-07 ENCOUNTER — Other Ambulatory Visit: Payer: Self-pay

## 2019-09-07 ENCOUNTER — Ambulatory Visit
Admission: RE | Admit: 2019-09-07 | Discharge: 2019-09-07 | Disposition: A | Discharge status: Home / Self Care | Payer: Medicare Other | Attending: Internal Medicine | Admitting: Internal Medicine

## 2019-09-07 ENCOUNTER — Encounter
Admission: RE | Disposition: A | Discharge status: Home / Self Care | Payer: Self-pay | Source: Home / Self Care | Attending: Internal Medicine

## 2019-09-07 ENCOUNTER — Ambulatory Visit: Payer: Medicare Other | Admitting: Anesthesiology

## 2019-09-07 DIAGNOSIS — K640 First degree hemorrhoids: Secondary | ICD-10-CM | POA: Insufficient documentation

## 2019-09-07 DIAGNOSIS — Z79899 Other long term (current) drug therapy: Secondary | ICD-10-CM | POA: Diagnosis not present

## 2019-09-07 DIAGNOSIS — J45909 Unspecified asthma, uncomplicated: Secondary | ICD-10-CM | POA: Insufficient documentation

## 2019-09-07 DIAGNOSIS — E1140 Type 2 diabetes mellitus with diabetic neuropathy, unspecified: Secondary | ICD-10-CM | POA: Insufficient documentation

## 2019-09-07 DIAGNOSIS — F419 Anxiety disorder, unspecified: Secondary | ICD-10-CM | POA: Insufficient documentation

## 2019-09-07 DIAGNOSIS — K7469 Other cirrhosis of liver: Secondary | ICD-10-CM | POA: Diagnosis not present

## 2019-09-07 DIAGNOSIS — I10 Essential (primary) hypertension: Secondary | ICD-10-CM | POA: Insufficient documentation

## 2019-09-07 DIAGNOSIS — G8929 Other chronic pain: Secondary | ICD-10-CM | POA: Diagnosis not present

## 2019-09-07 DIAGNOSIS — J4520 Mild intermittent asthma, uncomplicated: Secondary | ICD-10-CM | POA: Diagnosis not present

## 2019-09-07 DIAGNOSIS — K591 Functional diarrhea: Secondary | ICD-10-CM | POA: Insufficient documentation

## 2019-09-07 DIAGNOSIS — Z7982 Long term (current) use of aspirin: Secondary | ICD-10-CM | POA: Insufficient documentation

## 2019-09-07 DIAGNOSIS — G4730 Sleep apnea, unspecified: Secondary | ICD-10-CM | POA: Diagnosis not present

## 2019-09-07 DIAGNOSIS — F329 Major depressive disorder, single episode, unspecified: Secondary | ICD-10-CM | POA: Insufficient documentation

## 2019-09-07 DIAGNOSIS — M549 Dorsalgia, unspecified: Secondary | ICD-10-CM | POA: Insufficient documentation

## 2019-09-07 DIAGNOSIS — Z7984 Long term (current) use of oral hypoglycemic drugs: Secondary | ICD-10-CM | POA: Diagnosis not present

## 2019-09-07 DIAGNOSIS — K5730 Diverticulosis of large intestine without perforation or abscess without bleeding: Secondary | ICD-10-CM | POA: Diagnosis not present

## 2019-09-07 HISTORY — DX: Type 2 diabetes mellitus without complications: E11.9

## 2019-09-07 HISTORY — PX: COLONOSCOPY WITH PROPOFOL: SHX5780

## 2019-09-07 LAB — GLUCOSE, CAPILLARY: Glucose-Capillary: 154 mg/dL — ABNORMAL HIGH (ref 70–99)

## 2019-09-07 SURGERY — COLONOSCOPY WITH PROPOFOL
Anesthesia: General

## 2019-09-07 MED ORDER — PHENYLEPHRINE HCL (PRESSORS) 10 MG/ML IV SOLN
INTRAVENOUS | Status: DC | PRN
  Administered 2019-09-07: 100 ug via INTRAVENOUS

## 2019-09-07 MED ORDER — PHENYLEPHRINE HCL (PRESSORS) 10 MG/ML IV SOLN
INTRAVENOUS | Status: AC
  Filled 2019-09-07: qty 1

## 2019-09-07 MED ORDER — FENTANYL CITRATE (PF) 100 MCG/2ML IJ SOLN
INTRAMUSCULAR | Status: DC | PRN
  Administered 2019-09-07: 50 ug via INTRAVENOUS

## 2019-09-07 MED ORDER — PROPOFOL 10 MG/ML IV BOLUS
INTRAVENOUS | Status: AC
  Filled 2019-09-07: qty 80

## 2019-09-07 MED ORDER — MIDAZOLAM HCL 2 MG/2ML IJ SOLN
INTRAMUSCULAR | Status: AC
  Filled 2019-09-07: qty 2

## 2019-09-07 MED ORDER — PROPOFOL 500 MG/50ML IV EMUL
INTRAVENOUS | Status: DC | PRN
  Administered 2019-09-07: 120 ug/kg/min via INTRAVENOUS

## 2019-09-07 MED ORDER — SODIUM CHLORIDE 0.9 % IV SOLN
INTRAVENOUS | Status: DC
  Administered 2019-09-07: 09:00:00 via INTRAVENOUS

## 2019-09-07 MED ORDER — LIDOCAINE HCL (PF) 2 % IJ SOLN
INTRAMUSCULAR | Status: AC
  Filled 2019-09-07: qty 10

## 2019-09-07 MED ORDER — FENTANYL CITRATE (PF) 100 MCG/2ML IJ SOLN
INTRAMUSCULAR | Status: AC
  Filled 2019-09-07: qty 2

## 2019-09-07 MED ORDER — MIDAZOLAM HCL 2 MG/2ML IJ SOLN
INTRAMUSCULAR | Status: DC | PRN
  Administered 2019-09-07: 2 mg via INTRAVENOUS

## 2019-09-07 NOTE — H&P (Signed)
Outpatient short stay form Pre-procedure 09/07/2019 9:17 AM Laura Peddie K. Alice Reichert, M.D.  Primary Physician: [place] Pink, M.D.  Reason for visit:  Functional diarrhea  History of present illness: 68 y/o patient with hx of cryptogenic cirrhosis and Type II DM on Metformin presents for chronic diarrhea without known infectious or inflammatory component. No weight loss.     Current Facility-Administered Medications:  .  0.9 %  sodium chloride infusion, , Intravenous, Continuous, [place], Benay Pike, MD, Last Rate: 20 mL/hr at 09/07/19 [id number]  Medications Prior to Admission  Medication Sig Dispense Refill Last Dose  . aspirin EC 81 MG tablet Take 81 mg by mouth daily.   Past Week at Unknown time  . DULoxetine (CYMBALTA) 30 MG capsule Limit 2 capsules by mouth per day if tolerated (Patient taking differently: Take 60 mg by mouth 2 (two) times daily. Limit 2 capsules by mouth per day if tolerated) 60 capsule 0 09/06/2019 at Unknown time  . gabapentin (NEURONTIN) 300 MG capsule Limit 5- 8 capsules  per day if tolerated (Patient taking differently: Take 600 mg by mouth 4 (four) times daily. Limit 5- 8 capsules  per day if tolerated) 210 capsule 0 09/06/2019 at Unknown time  . glipiZIDE (GLUCOTROL XL) 5 MG 24 hr tablet Take 5 mg by mouth 2 (two) times daily.    Past Week at Unknown time  . lisinopril (PRINIVIL,ZESTRIL) 10 MG tablet Take 10 mg by mouth every morning. Reported on 06/02/2016   Past Week at Unknown time  . metFORMIN (GLUCOPHAGE) 1000 MG tablet Take 1,000 mg by mouth 2 (two) times daily with a meal.   Past Week at Unknown time  . traMADol (ULTRAM) 50 MG tablet Take 50 mg by mouth every 6 (six) hours as needed.   Past Week at Unknown time  . traZODone (DESYREL) 50 MG tablet Limit 1 tablet by mouth at bedtime when necessary for insomnia if tolerated (Patient taking differently: 100 mg. Limit 1 tablet by mouth at bedtime when necessary for insomnia if tolerated) 30 tablet 0 09/06/2019 at Unknown time  .  albuterol (PROVENTIL HFA;VENTOLIN HFA) 108 (90 Base) MCG/ACT inhaler Inhale 2 puffs into the lungs every 6 (six) hours as needed for wheezing or shortness of breath.     . pantoprazole (PROTONIX) 40 MG tablet Take 40 mg by mouth daily. 15-20 mins before meal   Not Taking at Unknown time  . pregabalin (LYRICA) 50 MG capsule Take 50 mg by mouth 2 (two) times daily.   Not Taking at Unknown time  . propranolol (INDERAL) 10 MG tablet Take 10 mg by mouth 3 (three) times daily.   Not Taking at Unknown time     Allergies  Allergen Reactions  . Atenolol Palpitations  . Clarithromycin Other (See Comments) and Nausea And Vomiting    Makes the tongue raw REACTION: nausea  . Doxycycline Palpitations    Makes her heart beat fast Other reaction(s): GI Upset (intolerance) Loose bowels REACTION: loose stool  . Sulfa Antibiotics Nausea Only  . Erythromycin Base Nausea And Vomiting  . Penicillins Rash    Has patient had a PCN reaction causing immediate rash, facial/tongue/throat swelling, SOB or lightheadedness with hypotension: Yes Has patient had a PCN reaction causing severe rash involving mucus membranes or skin necrosis: No Has patient had a PCN reaction that required hospitalization No Has patient had a PCN reaction occurring within the last 10 years: No If all of the above answers are "NO", then may proceed with Cephalosporin use.  REACTION: rash      Past Medical History:  Diagnosis Date  . Allergy   . Anxiety   . Asthma   . Chronic back pain   . Chronic cough 07/01/10   PFT  FEV1 2.20 (93%), FEV 1% 81, TLC 4,12 (81%), DLCO 78%, no BD. normal chest CT, sinus 07/08/10  . Cirrhosis, non-alcoholic ([place]) [id number]  . Depression   . Diabetes mellitus type 2, uncomplicated ([place])   . Diabetes mellitus without complication ([place])   . Diabetic neuropathy ([place])   . Diverticulosis   . Dysrhythmia    H/O PALPITATIONS-SINCE ABLATION IN 2013 AT [facility], PALPITATIONS MUCH BETTER  . GERD  (gastroesophageal reflux disease)    NO MEDS  . Hemorrhoids   . HTN (hypertension)   . NAFLD (nonalcoholic fatty liver disease) 01/18/2018  . Neuropathy   . Osteoarthritis   . Sleep apnea    NO CPAP    Review of systems:  Otherwise negative.    Physical Exam  Gen: Alert, oriented. Appears stated age.  HEENT: [place]/AT. PERRLA. Lungs: CTA, no wheezes. CV: RR nl S1, S2. Abd: soft, benign, no masses. BS+ Ext: No edema. Pulses 2+    Planned procedures: Proceed with colonoscopy. The patient understands the nature of the planned procedure, indications, risks, alternatives and potential complications including but not limited to bleeding, infection, perforation, damage to internal organs and possible oversedation/side effects from anesthesia. The patient agrees and gives consent to proceed.  Please refer to procedure notes for findings, recommendations and patient disposition/instructions.     Laura Landstrom K. Alice Reichert, M.D. Gastroenterology 09/07/2019  9:17 AM

## 2019-09-07 NOTE — Op Note (Signed)
[facility] Patient Name: Laura [name] Procedure Date: 09/07/2019 9:19 AM MRN: [id number] Account #: [id number] Date of Birth: [birth year] Admit Type: Outpatient Age: 68 Room: [facility] ENDO ROOM 3 Gender: Female Note Status: Finalized Procedure:            Colonoscopy Indications:          Functional diarrhea Providers:            Benay Pike. Alice Reichert MD, MD Referring MD:         Irven Easterly. Kary Kos, MD (Referring MD) Medicines:            Propofol per Anesthesia Complications:        No immediate complications. Procedure:            Pre-Anesthesia Assessment:                       - The risks and benefits of the procedure and the                        sedation options and risks were discussed with the                        patient. All questions were answered and informed                        consent was obtained.                       - Patient identification and proposed procedure were                        verified prior to the procedure by the nurse. The                        procedure was verified in the procedure room.                       - ASA Grade Assessment: III - A patient with severe                        systemic disease.                       - After reviewing the risks and benefits, the patient                        was deemed in satisfactory condition to undergo the                        procedure.                       After obtaining informed consent, the colonoscope was                        passed under direct vision. Throughout the procedure,                        the patient's blood pressure, pulse, and oxygen  saturations were monitored continuously. The                        Colonoscope was introduced through the anus and                        advanced to the the cecum, identified by appendiceal                        orifice and ileocecal valve. The colonoscopy was   performed without difficulty. The patient tolerated the                        procedure well. The quality of the bowel preparation                        was good. The ileocecal valve, appendiceal orifice, and                        rectum were photographed. Findings:      The perianal and digital rectal examinations were normal. Pertinent       negatives include normal sphincter tone and no palpable rectal lesions.      A few small-mouthed diverticula were found in the transverse colon and       left colon.      Normal mucosa was found in the entire colon. Biopsies for histology were       taken with a cold forceps from the random colon for evaluation of       microscopic colitis.      Non-bleeding internal hemorrhoids were found during retroflexion. The       hemorrhoids were Grade I (internal hemorrhoids that do not prolapse).      The exam was otherwise without abnormality. Impression:           - Diverticulosis in the transverse colon and in the                        left colon.                       - Normal mucosa in the entire examined colon. Biopsied.                       - Non-bleeding internal hemorrhoids.                       - The examination was otherwise normal. Recommendation:       - Patient has a contact number available for                        emergencies. The signs and symptoms of potential                        delayed complications were discussed with the patient.                        Return to normal activities tomorrow. Written discharge                        instructions were provided to the patient.                       -  Resume previous diet.                       - Continue present medications.                       - No repeat colonoscopy due to current age (13 years or                        older) and the absence of advanced adenomas.                       - Return to nurse practitioner in 3 months.                       - The findings and  recommendations were discussed with                        the patient. Procedure Code(s):    --- Professional ---                       [phone], Colonoscopy, flexible; with biopsy, single or                        multiple Diagnosis Code(s):    --- Professional ---                       K57.30, Diverticulosis of large intestine without                        perforation or abscess without bleeding                       K59.1, Functional diarrhea                       K64.0, First degree hemorrhoids CPT copyright 2019 American Medical Association. All rights reserved. The codes documented in this report are preliminary and upon coder review may  be revised to meet current compliance requirements. Efrain Sella MD, MD 09/07/2019 9:45:37 AM This report has been signed electronically. Number of Addenda: 0 Note Initiated On: 09/07/2019 9:19 AM Scope Withdrawal Time: 0 hours 7 minutes 26 seconds  Total Procedure Duration: 0 hours 10 minutes 18 seconds  Estimated Blood Loss: Estimated blood loss: none.      [facility]

## 2019-09-07 NOTE — Transfer of Care (Signed)
Immediate Anesthesia Transfer of Care Note  Patient: Laura [name]  Procedure(s) Performed: COLONOSCOPY WITH PROPOFOL (N/A )  Patient Location: PACU and Endoscopy Unit  Anesthesia Type:General  Level of Consciousness: awake, drowsy and patient cooperative  Airway & Oxygen Therapy: Patient Spontanous Breathing  Post-op Assessment: Report given to RN, Post -op Vital signs reviewed and stable and Patient moving all extremities  Post vital signs: Reviewed and stable  Last Vitals:  Vitals Value Taken Time  BP 117/45 09/07/19 0948  Temp    Pulse 64 09/07/19 0948  Resp 14 09/07/19 0948  SpO2 95 % 09/07/19 0948  Vitals shown include unvalidated device data.  Last Pain:  Vitals:   09/07/19 0831  TempSrc: Tympanic  PainSc: 0-No pain         Complications: No apparent anesthesia complications

## 2019-09-07 NOTE — Interval H&P Note (Signed)
History and Physical Interval Note:  09/07/2019 9:18 AM  Laura [name]  has presented today for surgery, with the diagnosis of diarrhea.  The various methods of treatment have been discussed with the patient and family. After consideration of risks, benefits and other options for treatment, the patient has consented to  Procedure(s): COLONOSCOPY WITH PROPOFOL (N/A) as a surgical intervention.  The patient's history has been reviewed, patient examined, no change in status, stable for surgery.  I have reviewed the patient's chart and labs.  Questions were answered to the patient's satisfaction.     [place], [place]

## 2019-09-07 NOTE — Anesthesia Post-op Follow-up Note (Signed)
Anesthesia QCDR form completed.        

## 2019-09-07 NOTE — Anesthesia Preprocedure Evaluation (Signed)
Anesthesia Evaluation  Patient identified by MRN, date of birth, ID band Patient awake    Reviewed: Allergy & Precautions, NPO status , Patient's Chart, lab work & pertinent test results  History of Anesthesia Complications Negative for: history of anesthetic complications  Airway Mallampati: II  TM Distance: >3 FB Neck ROM: Full    Dental  (+) Poor Dentition, Missing   Pulmonary asthma (mild intermittent) , sleep apnea ,    breath sounds clear to auscultation- rhonchi (-) wheezing      Cardiovascular hypertension, Pt. on medications (-) CAD, (-) Past MI, (-) Cardiac Stents and (-) CABG  Rhythm:Regular Rate:Normal - Systolic murmurs and - Diastolic murmurs    Neuro/Psych neg Seizures PSYCHIATRIC DISORDERS Anxiety Depression negative neurological ROS     GI/Hepatic Neg liver ROS, GERD  ,  Endo/Other  diabetes, Oral Hypoglycemic Agents  Renal/GU negative Renal ROS     Musculoskeletal  (+) Arthritis ,   Abdominal (+) + obese,   Peds  Hematology negative hematology ROS (+)   Anesthesia Other Findings Past Medical History: No date: Allergy No date: Anxiety No date: Asthma No date: Chronic back pain 07/01/10: Chronic cough     Comment:  PFT  FEV1 2.20 (93%), FEV 1% 81, TLC 4,12 (81%), DLCO               78%, no BD. normal chest CT, sinus 07/08/10 01/18/2018: Cirrhosis, non-alcoholic (HCC) No date: Depression No date: Diabetes mellitus type 2, uncomplicated (HCC) No date: Diabetes mellitus without complication (HCC) No date: Diabetic neuropathy ([place]) No date: Diverticulosis No date: Dysrhythmia     Comment:  H/O PALPITATIONS-SINCE ABLATION IN 2013 AT [facility],               PALPITATIONS MUCH BETTER No date: GERD (gastroesophageal reflux disease)     Comment:  NO MEDS No date: Hemorrhoids No date: HTN (hypertension) 01/18/2018: NAFLD (nonalcoholic fatty liver disease) No date: Neuropathy No date:  Osteoarthritis No date: Sleep apnea     Comment:  NO CPAP   Reproductive/Obstetrics                             Anesthesia Physical Anesthesia Plan  ASA: III  Anesthesia Plan: General   Post-op Pain Management:    Induction: Intravenous  PONV Risk Score and Plan: 2 and Propofol infusion  Airway Management Planned: Natural Airway  Additional Equipment:   Intra-op Plan:   Post-operative Plan:   Informed Consent: I have reviewed the patients History and Physical, chart, labs and discussed the procedure including the risks, benefits and alternatives for the proposed anesthesia with the patient or authorized representative who has indicated his/her understanding and acceptance.     Dental advisory given  Plan Discussed with: CRNA and Anesthesiologist  Anesthesia Plan Comments:         Anesthesia Quick Evaluation

## 2019-09-08 LAB — SURGICAL PATHOLOGY

## 2019-09-09 ENCOUNTER — Encounter: Payer: Self-pay | Admitting: Internal Medicine

## 2019-09-09 NOTE — Anesthesia Postprocedure Evaluation (Signed)
Anesthesia Post Note  Patient: Laura [name]  Procedure(s) Performed: COLONOSCOPY WITH PROPOFOL (N/A )  Patient location during evaluation: Endoscopy Anesthesia Type: General Level of consciousness: awake and alert and oriented Pain management: pain level controlled Vital Signs Assessment: post-procedure vital signs reviewed and stable Respiratory status: spontaneous breathing, nonlabored ventilation and respiratory function stable Cardiovascular status: blood pressure returned to baseline and stable Postop Assessment: no signs of nausea or vomiting Anesthetic complications: no     Last Vitals:  Vitals:   09/07/19 0831  BP: (!) 173/81  Pulse: 72  Resp: 16  Temp: (!) 36.4 C  SpO2: 98%    Last Pain:  Vitals:   09/08/19 0736  TempSrc:   PainSc: 0-No pain                 Thomas Mabry

## 2020-04-26 ENCOUNTER — Other Ambulatory Visit: Payer: Self-pay

## 2020-04-26 ENCOUNTER — Encounter: Payer: Self-pay | Admitting: Internal Medicine

## 2020-04-26 ENCOUNTER — Ambulatory Visit (INDEPENDENT_AMBULATORY_CARE_PROVIDER_SITE_OTHER): Payer: Medicare Other | Admitting: Internal Medicine

## 2020-04-26 VITALS — BP 150/75 | HR 90 | Wt 179.1 lb

## 2020-04-26 DIAGNOSIS — M51369 Other intervertebral disc degeneration, lumbar region without mention of lumbar back pain or lower extremity pain: Secondary | ICD-10-CM

## 2020-04-26 DIAGNOSIS — E1140 Type 2 diabetes mellitus with diabetic neuropathy, unspecified: Secondary | ICD-10-CM | POA: Diagnosis not present

## 2020-04-26 DIAGNOSIS — E0841 Diabetes mellitus due to underlying condition with diabetic mononeuropathy: Secondary | ICD-10-CM

## 2020-04-26 DIAGNOSIS — M5416 Radiculopathy, lumbar region: Secondary | ICD-10-CM

## 2020-04-26 DIAGNOSIS — M5136 Other intervertebral disc degeneration, lumbar region: Secondary | ICD-10-CM

## 2020-04-26 DIAGNOSIS — E119 Type 2 diabetes mellitus without complications: Secondary | ICD-10-CM

## 2020-04-26 DIAGNOSIS — F341 Dysthymic disorder: Secondary | ICD-10-CM

## 2020-04-26 DIAGNOSIS — G479 Sleep disorder, unspecified: Secondary | ICD-10-CM

## 2020-04-26 LAB — GLUCOSE, POCT (MANUAL RESULT ENTRY): POC Glucose: 68 mg/dl — AB (ref 70–99)

## 2020-04-26 MED ORDER — TRAMADOL HCL 50 MG PO TABS: 50.0000 mg | ORAL_TABLET | Freq: Two times a day (BID) | ORAL | 0 refills | Status: DC

## 2020-04-26 NOTE — Assessment & Plan Note (Signed)
Hypertension is stable. 

## 2020-04-26 NOTE — Assessment & Plan Note (Signed)
Medicine for neuropathy.

## 2020-04-26 NOTE — Assessment & Plan Note (Signed)
Patient see a doctor for treatment of her anxiety

## 2020-04-26 NOTE — Progress Notes (Signed)
Established Patient Office Visit  Subjective:  Patient ID: Laura [name], female    DOB: 1951-09-15  Age: 69 y.o. MRN: [id number]  CC:  Chief Complaint  Patient presents with  . Peripheral Neuropathy  . Anxiety  . Diabetes    patient checked blood sugar at home and it was 68    HPI  Laura [name] presents for weakness of the legs and feels tired and fatigued all the time.  She is under a lot of stress because of her husband's sickness.  And has to work at the same time.  Past Medical History:  Diagnosis Date  . Allergy   . Anxiety   . Asthma   . Chronic back pain   . Chronic cough 07/01/10   PFT  FEV1 2.20 (93%), FEV 1% 81, TLC 4,12 (81%), DLCO 78%, no BD. normal chest CT, sinus 07/08/10  . Cirrhosis, non-alcoholic ([place]) [id number]  . Depression   . Diabetes mellitus type 2, uncomplicated ([place])   . Diabetes mellitus without complication ([place])   . Diabetic neuropathy ([place])   . Diverticulosis   . Dysrhythmia    H/O PALPITATIONS-SINCE ABLATION IN 2013 AT [facility], PALPITATIONS MUCH BETTER  . GERD (gastroesophageal reflux disease)    NO MEDS  . Hemorrhoids   . HTN (hypertension)   . NAFLD (nonalcoholic fatty liver disease) 01/18/2018  . Neuropathy   . Osteoarthritis   . Sleep apnea    NO CPAP    Past Surgical History:  Procedure Laterality Date  . BRONCHOSCOPY  07/25/10  . CARDIAC ELECTROPHYSIOLOGY STUDY AND ABLATION    . CARPAL TUNNEL RELEASE    . CATARACT EXTRACTION Bilateral Jan 2017  . cathater ablation     for arrhythmia  . COLONOSCOPY  2014  . COLONOSCOPY WITH PROPOFOL N/A 09/07/2019   Procedure: COLONOSCOPY WITH PROPOFOL;  Surgeon: Toledo, Benay Pike, MD;  Location: [facility] ENDOSCOPY;  Service: Gastroenterology;  Laterality: N/A;  . ESOPHAGOGASTRODUODENOSCOPY (EGD) WITH PROPOFOL N/A 07/05/2018   Procedure: ESOPHAGOGASTRODUODENOSCOPY (EGD) WITH PROPOFOL;  Surgeon: Manya Silvas, MD;  Location: [facility] ENDOSCOPY;  Service: Endoscopy;  Laterality: N/A;  . FOOT  SURGERY    . HEMORROIDECTOMY    . LIPOMA EXCISION Right 10/20/2016   Procedure: EXCISION LIPOMA;  Surgeon: Christene Lye, MD;  Location: [facility] ORS;  Service: General;  Laterality: Right;  . PARTIAL HYSTERECTOMY    . TOENAIL EXCISION Right     Family History  Problem Relation Age of Onset  . Cancer Mother        stomach  . Cancer Father        brain  . Heart disease Father   . Cancer Brother        mouth  . Heart disease Brother   . Ovarian cancer Maternal Grandmother     Social History   Socioeconomic History  . Marital status: Married    Spouse name: Not on file  . Number of children: Not on file  . Years of education: Not on file  . Highest education level: Not on file  Occupational History  . Not on file  Tobacco Use  . Smoking status: Never Smoker  . Smokeless tobacco: Never Used  Substance and Sexual Activity  . Alcohol use: No    Alcohol/week: 0.0 standard drinks  . Drug use: No  . Sexual activity: Not on file  Other Topics Concern  . Not on file  Social History Narrative   Married, 2 children. Works in  day care center   Cell: [phone]    Social Determinants of Health   Financial Resource Strain:   . Difficulty of Paying Living Expenses:   Food Insecurity:   . Worried About Charity fundraiser in the Last Year:   . Arboriculturist in the Last Year:   Transportation Needs:   . Film/video editor (Medical):   Marland Kitchen Lack of Transportation (Non-Medical):   Physical Activity:   . Days of Exercise per Week:   . Minutes of Exercise per Session:   Stress:   . Feeling of Stress :   Social Connections:   . Frequency of Communication with Friends and Family:   . Frequency of Social Gatherings with Friends and Family:   . Attends Religious Services:   . Active Member of Clubs or Organizations:   . Attends Archivist Meetings:   Marland Kitchen Marital Status:   Intimate Partner Violence:   . Fear of Current or Ex-Partner:   . Emotionally Abused:     Marland Kitchen Physically Abused:   . Sexually Abused:      Current Outpatient Medications:  .  albuterol (PROVENTIL HFA;VENTOLIN HFA) 108 (90 Base) MCG/ACT inhaler, Inhale 2 puffs into the lungs every 6 (six) hours as needed for wheezing or shortness of breath., Disp: , Rfl:  .  aspirin EC 81 MG tablet, Take 81 mg by mouth daily., Disp: , Rfl:  .  DULoxetine (CYMBALTA) 30 MG capsule, Limit 2 capsules by mouth per day if tolerated (Patient taking differently: Take 60 mg by mouth 2 (two) times daily. Limit 2 capsules by mouth per day if tolerated), Disp: 60 capsule, Rfl: 0 .  gabapentin (NEURONTIN) 300 MG capsule, Limit 5- 8 capsules  per day if tolerated (Patient taking differently: Take 600 mg by mouth 4 (four) times daily. Limit 5- 8 capsules  per day if tolerated), Disp: 210 capsule, Rfl: 0 .  glipiZIDE (GLUCOTROL XL) 5 MG 24 hr tablet, Take 5 mg by mouth 2 (two) times daily. , Disp: , Rfl:  .  lisinopril (PRINIVIL,ZESTRIL) 10 MG tablet, Take 10 mg by mouth every morning. Reported on 06/02/2016, Disp: , Rfl:  .  metFORMIN (GLUCOPHAGE) 1000 MG tablet, Take 1,000 mg by mouth 2 (two) times daily with a meal., Disp: , Rfl:  .  pantoprazole (PROTONIX) 40 MG tablet, Take 40 mg by mouth daily. 15-20 mins before meal, Disp: , Rfl:  .  traMADol (ULTRAM) 50 MG tablet, Take 50 mg by mouth every 6 (six) hours as needed., Disp: , Rfl:  .  traMADol (ULTRAM) 50 MG tablet, Take 1 tablet (50 mg total) by mouth 2 (two) times daily., Disp: 60 tablet, Rfl: 0 .  traZODone (DESYREL) 50 MG tablet, Limit 1 tablet by mouth at bedtime when necessary for insomnia if tolerated (Patient taking differently: 100 mg. Limit 1 tablet by mouth at bedtime when necessary for insomnia if tolerated), Disp: 30 tablet, Rfl: 0   Allergies  Allergen Reactions  . Atenolol Palpitations  . Clarithromycin Other (See Comments) and Nausea And Vomiting    Makes the tongue raw REACTION: nausea  . Doxycycline Palpitations    Makes her heart beat  fast Other reaction(s): GI Upset (intolerance) Loose bowels REACTION: loose stool  . Sulfa Antibiotics Nausea Only  . Erythromycin Base Nausea And Vomiting  . Penicillins Rash    Has patient had a PCN reaction causing immediate rash, facial/tongue/throat swelling, SOB or lightheadedness with hypotension: Yes Has patient had a  PCN reaction causing severe rash involving mucus membranes or skin necrosis: No Has patient had a PCN reaction that required hospitalization No Has patient had a PCN reaction occurring within the last 10 years: No If all of the above answers are "NO", then may proceed with Cephalosporin use.  REACTION: rash     ROS Review of Systems  Constitutional: Negative.   HENT: Negative.  Negative for postnasal drip.   Eyes: Negative.  Negative for itching.  Respiratory: Negative.  Negative for chest tightness.   Cardiovascular: Negative.   Gastrointestinal: Negative.   Endocrine: Negative.   Genitourinary: Negative.  Negative for urgency.  Musculoskeletal: Positive for arthralgias.  Neurological: Negative.       Objective:    Physical Exam  Constitutional: She appears well-developed and well-nourished.  HENT:  Head: Normocephalic.  Eyes: Pupils are equal, round, and reactive to light.  Neck: No JVD present. No tracheal deviation present. No thyromegaly present.  Cardiovascular:  No murmur heard. Pulmonary/Chest: Breath sounds normal.  Musculoskeletal:     Cervical back: Normal range of motion.  Lymphadenopathy:    She has no cervical adenopathy.  Skin: Skin is dry.    BP (!) 150/75   Pulse 90   Wt 179 lb 1.6 oz (81.2 kg)   BMI 29.80 kg/m  Wt Readings from Last 3 Encounters:  04/26/20 179 lb 1.6 oz (81.2 kg)  09/07/19 182 lb (82.6 kg)  07/26/19 177 lb (80.3 kg)     Health Maintenance Due  Topic Date Due  . HEMOGLOBIN A1C  Never done  . Hepatitis C Screening  Never done  . FOOT EXAM  Never done  . OPHTHALMOLOGY EXAM  Never done  . COVID-19  Vaccine (1) Never done  . MAMMOGRAM  Never done  . DEXA SCAN  Never done    There are no preventive care reminders to display for this patient.  No results found for: TSH Lab Results  Component Value Date   WBC 6.6 07/26/2019   HGB 13.0 07/26/2019   HCT 40.8 07/26/2019   MCV 81.6 07/26/2019   PLT 158 07/26/2019   Lab Results  Component Value Date   NA 139 07/26/2019   K 4.3 07/26/2019   CO2 27 07/26/2019   GLUCOSE 129 (H) 07/26/2019   BUN 18 07/26/2019   CREATININE 0.81 07/26/2019   CALCIUM 9.1 07/26/2019   ANIONGAP 6 07/26/2019   No results found for: CHOL No results found for: HDL No results found for: LDLCALC No results found for: TRIG No results found for: CHOLHDL No results found for: HGBA1C    Assessment & Plan:   Problem List Items Addressed This Visit      Endocrine   Chronic painful diabetic neuropathy ([place])   Relevant Medications   traMADol (ULTRAM) 50 MG tablet   Diabetic neuropathy [facility])    Medicine for neuropathy.      Type 2 diabetes mellitus without complication, without long-term current use of insulin (HCC)   Relevant Orders   POCT glucose (manual entry) (Completed)     Nervous and Auditory   Lumbar radiculopathy - Primary     Musculoskeletal and Integument   DDD (degenerative disc disease), lumbar   Relevant Medications   traMADol (ULTRAM) 50 MG tablet     Other   ANXIETY DEPRESSION    Patient see a doctor for treatment of her anxiety      Difficulty sleeping      Meds ordered this encounter  Medications  .  traMADol (ULTRAM) 50 MG tablet    Sig: Take 1 tablet (50 mg total) by mouth 2 (two) times daily.    Dispense:  60 tablet    Refill:  0   1. Type 2 diabetes mellitus without complication, without long-term current use of insulin (HCC) Stable - POCT glucose (manual entry)  2. Lumbar radiculopathy Chronic problem.  3. Chronic painful diabetic neuropathy (H.  Able C) Able  4. DDD (degenerative disc disease),  lumbar Stable  5. Difficulty sleeping Neck problem  6. Diabetic mononeuropathy associated with diabetes mellitus due to underlying condition ([place]) Chronic problem  7. ANXIETY DEPRESSION Patient is very anxious because of the vascular dementia of her husband. Follow-up: Return in about 8 weeks (around 06/21/2020).    Cletis Athens, MD

## 2020-06-28 ENCOUNTER — Other Ambulatory Visit: Payer: Self-pay

## 2020-06-28 ENCOUNTER — Encounter: Payer: Self-pay | Admitting: Internal Medicine

## 2020-06-28 ENCOUNTER — Ambulatory Visit (INDEPENDENT_AMBULATORY_CARE_PROVIDER_SITE_OTHER): Payer: Medicare Other | Admitting: Internal Medicine

## 2020-06-28 VITALS — BP 140/64 | HR 75 | Ht 65.0 in | Wt 175.4 lb

## 2020-06-28 DIAGNOSIS — I10 Essential (primary) hypertension: Secondary | ICD-10-CM | POA: Diagnosis not present

## 2020-06-28 DIAGNOSIS — K760 Fatty (change of) liver, not elsewhere classified: Secondary | ICD-10-CM

## 2020-06-28 DIAGNOSIS — E1140 Type 2 diabetes mellitus with diabetic neuropathy, unspecified: Secondary | ICD-10-CM

## 2020-06-28 DIAGNOSIS — E1369 Other specified diabetes mellitus with other specified complication: Secondary | ICD-10-CM

## 2020-06-28 DIAGNOSIS — E8881 Metabolic syndrome: Secondary | ICD-10-CM

## 2020-06-28 LAB — GLUCOSE, POCT (MANUAL RESULT ENTRY): POC Glucose: 438 mg/dl — AB (ref 70–99)

## 2020-06-28 MED ORDER — TRAMADOL HCL 50 MG PO TABS: 50.0000 mg | ORAL_TABLET | Freq: Two times a day (BID) | ORAL | 0 refills | Status: DC

## 2020-06-28 NOTE — Assessment & Plan Note (Signed)
It is a chronic problem.  Will defer to the treatment from neurology.

## 2020-06-28 NOTE — Assessment & Plan Note (Signed)
Blood pressure is stable on today's visit.  Was advised to continue taking her present medication.

## 2020-06-28 NOTE — Assessment & Plan Note (Signed)
-   I encouraged the patient to lose weight.  - I educated them on making healthy dietary choices including eating more fruits and vegetables and less fried foods. - I encouraged the patient to exercise more, and educated on the benefits of exercise including weight loss,  

## 2020-06-28 NOTE — Assessment & Plan Note (Signed)
She carries a diagnosis of fatty liver I told her to lose weight and follow up  with a GI specialist.

## 2020-06-28 NOTE — Patient Instructions (Signed)
.  ptinfo

## 2020-06-28 NOTE — Progress Notes (Signed)
Established Patient Office Visit  Subjective:  Patient ID: Laura [name], female    DOB: 06/01/1951  Age: 69 y.o. MRN: [id number]  CC:  Chief Complaint  Patient presents with  . Pain    Patient presents for a 1 month medication refill on her Tramadol    Neuropathy She describes symptoms of burning. Onset of symptoms was sudden, not related to any specific activity. Symptoms are currently of moderate severity. Symptoms occur intermittently and last minutes. The patient denies numbness. Symptoms are worse on the right side. She also describes autonomic symptoms of  diarrhea. She denies  dry eyes. Previous treatment has included tramadol   , which had improved symptoms.  Her Husband has been diagnosed to have vascular dementia  She has hard time Coping with the situation.  She sees Endocrine doctor for the control of the blood sugar  advised to monitor her blood sugar at least twice a day.   She needs a mammogram foot exam eye exam and hemoglobin AIC.  Laura [name] presents for neuropathy  Pain , she is diabetic has hypertension  Past Medical History:  Diagnosis Date  . Allergy   . Anxiety   . Asthma   . Chronic back pain   . Chronic cough 07/01/10   PFT  FEV1 2.20 (93%), FEV 1% 81, TLC 4,12 (81%), DLCO 78%, no BD. normal chest CT, sinus 07/08/10  . Cirrhosis, non-alcoholic ([place]) 29/92/4268  . Depression   . Diabetes mellitus type 2, uncomplicated ([place])   . Diabetes mellitus without complication ([place])   . Diabetic neuropathy ([place])   . Diverticulosis   . Dysrhythmia    H/O PALPITATIONS-SINCE ABLATION IN 2013 AT [facility], PALPITATIONS MUCH BETTER  . GERD (gastroesophageal reflux disease)    NO MEDS  . Hemorrhoids   . HTN (hypertension)   . NAFLD (nonalcoholic fatty liver disease) 01/18/2018  . Neuropathy   . Osteoarthritis   . Sleep apnea    NO CPAP    Past Surgical History:  Procedure Laterality Date  . BRONCHOSCOPY  07/25/10  . CARDIAC ELECTROPHYSIOLOGY STUDY AND  ABLATION    . CARPAL TUNNEL RELEASE    . CATARACT EXTRACTION Bilateral Jan 2017  . cathater ablation     for arrhythmia  . COLONOSCOPY  2014  . COLONOSCOPY WITH PROPOFOL N/A 09/07/2019   Procedure: COLONOSCOPY WITH PROPOFOL;  Surgeon: Toledo, Benay Pike, MD;  Location: [facility] ENDOSCOPY;  Service: Gastroenterology;  Laterality: N/A;  . ESOPHAGOGASTRODUODENOSCOPY (EGD) WITH PROPOFOL N/A 07/05/2018   Procedure: ESOPHAGOGASTRODUODENOSCOPY (EGD) WITH PROPOFOL;  Surgeon: Manya Silvas, MD;  Location: [facility] ENDOSCOPY;  Service: Endoscopy;  Laterality: N/A;  . FOOT SURGERY    . HEMORROIDECTOMY    . LIPOMA EXCISION Right 10/20/2016   Procedure: EXCISION LIPOMA;  Surgeon: Christene Lye, MD;  Location: [facility] ORS;  Service: General;  Laterality: Right;  . PARTIAL HYSTERECTOMY    . TOENAIL EXCISION Right     Family History  Problem Relation Age of Onset  . Cancer Mother        stomach  . Cancer Father        brain  . Heart disease Father   . Cancer Brother        mouth  . Heart disease Brother   . Ovarian cancer Maternal Grandmother     Social History   Socioeconomic History  . Marital status: Married    Spouse name: Not on file  . Number of children: Not on  file  . Years of education: Not on file  . Highest education level: Not on file  Occupational History  . Not on file  Tobacco Use  . Smoking status: Never Smoker  . Smokeless tobacco: Never Used  Substance and Sexual Activity  . Alcohol use: No    Alcohol/week: 0.0 standard drinks  . Drug use: No  . Sexual activity: Not on file  Other Topics Concern  . Not on file  Social History Narrative   Married, 2 children. Works in day care center   Cell: [phone]    Social Determinants of Health   Financial Resource Strain:   . Difficulty of Paying Living Expenses:   Food Insecurity:   . Worried About Charity fundraiser in the Last Year:   . Arboriculturist in the Last Year:   Transportation Needs:   . Lexicographer (Medical):   Marland Kitchen Lack of Transportation (Non-Medical):   Physical Activity:   . Days of Exercise per Week:   . Minutes of Exercise per Session:   Stress:   . Feeling of Stress :   Social Connections:   . Frequency of Communication with Friends and Family:   . Frequency of Social Gatherings with Friends and Family:   . Attends Religious Services:   . Active Member of Clubs or Organizations:   . Attends Archivist Meetings:   Marland Kitchen Marital Status:   Intimate Partner Violence:   . Fear of Current or Ex-Partner:   . Emotionally Abused:   Marland Kitchen Physically Abused:   . Sexually Abused:      Current Outpatient Medications:  .  albuterol (PROVENTIL HFA;VENTOLIN HFA) 108 (90 Base) MCG/ACT inhaler, Inhale 2 puffs into the lungs every 6 (six) hours as needed for wheezing or shortness of breath., Disp: , Rfl:  .  aspirin EC 81 MG tablet, Take 81 mg by mouth daily., Disp: , Rfl:  .  DULoxetine (CYMBALTA) 30 MG capsule, Limit 2 capsules by mouth per day if tolerated (Patient taking differently: Take 60 mg by mouth 2 (two) times daily. Limit 2 capsules by mouth per day if tolerated), Disp: 60 capsule, Rfl: 0 .  gabapentin (NEURONTIN) 300 MG capsule, Limit 5- 8 capsules  per day if tolerated (Patient taking differently: Take 600 mg by mouth 4 (four) times daily. Limit 5- 8 capsules  per day if tolerated), Disp: 210 capsule, Rfl: 0 .  glipiZIDE (GLUCOTROL XL) 5 MG 24 hr tablet, Take 5 mg by mouth 2 (two) times daily. , Disp: , Rfl:  .  lisinopril (PRINIVIL,ZESTRIL) 10 MG tablet, Take 10 mg by mouth every morning. Reported on 06/02/2016, Disp: , Rfl:  .  metFORMIN (GLUCOPHAGE) 1000 MG tablet, Take 1,000 mg by mouth 2 (two) times daily with a meal., Disp: , Rfl:  .  pantoprazole (PROTONIX) 40 MG tablet, Take 40 mg by mouth daily. 15-20 mins before meal, Disp: , Rfl:  .  traMADol (ULTRAM) 50 MG tablet, Take 1 tablet (50 mg total) by mouth 2 (two) times daily., Disp: 60 tablet, Rfl: 0 .   traZODone (DESYREL) 50 MG tablet, Limit 1 tablet by mouth at bedtime when necessary for insomnia if tolerated (Patient taking differently: 100 mg. Limit 1 tablet by mouth at bedtime when necessary for insomnia if tolerated), Disp: 30 tablet, Rfl: 0   Allergies  Allergen Reactions  . Atenolol Palpitations  . Clarithromycin Other (See Comments) and Nausea And Vomiting    Makes the tongue  raw REACTION: nausea  . Doxycycline Palpitations    Makes her heart beat fast Other reaction(s): GI Upset (intolerance) Loose bowels REACTION: loose stool  . Sulfa Antibiotics Nausea Only  . Erythromycin Base Nausea And Vomiting  . Penicillins Rash    Has patient had a PCN reaction causing immediate rash, facial/tongue/throat swelling, SOB or lightheadedness with hypotension: Yes Has patient had a PCN reaction causing severe rash involving mucus membranes or skin necrosis: No Has patient had a PCN reaction that required hospitalization No Has patient had a PCN reaction occurring within the last 10 years: No If all of the above answers are "NO", then may proceed with Cephalosporin use.  REACTION: rash     ROS Review of Systems  Constitutional: Negative for appetite change.  HENT: Negative for dental problem and mouth sores.   Eyes: Negative for itching.  Respiratory: Negative.   Cardiovascular: Negative.   Gastrointestinal: Positive for diarrhea.  Genitourinary: Negative for flank pain.  Musculoskeletal: Negative for back pain.  Neurological: Negative for speech difficulty.  Psychiatric/Behavioral: Negative for behavioral problems and confusion.      Objective:    Physical Exam Constitutional:      Appearance: She is obese.  HENT:     Head: Normocephalic.  Eyes:     Conjunctiva/sclera: Conjunctivae normal.  Cardiovascular:     Rate and Rhythm: Regular rhythm.     Heart sounds: No murmur heard.   Pulmonary:     Effort: No respiratory distress.  Abdominal:     Tenderness: There is  no abdominal tenderness.  Skin:    Coloration: Skin is not jaundiced.  Neurological:     Motor: No weakness.  Psychiatric:        Mood and Affect: Mood normal.     BP 140/64   Pulse 75   Ht 5\' 5"  (1.651 m)   Wt 175 lb 6.4 oz (79.6 kg)   BMI 29.19 kg/m  Wt Readings from Last 3 Encounters:  06/28/20 175 lb 6.4 oz (79.6 kg)  04/26/20 179 lb 1.6 oz (81.2 kg)  09/07/19 182 lb (82.6 kg)     Health Maintenance Due  Topic Date Due  . HEMOGLOBIN A1C  Never done  . Hepatitis C Screening  Never done  . FOOT EXAM  Never done  . OPHTHALMOLOGY EXAM  Never done  . COVID-19 Vaccine (1) Never done  . MAMMOGRAM  Never done  . DEXA SCAN  Never done    There are no preventive care reminders to display for this patient.  No results found for: TSH Lab Results  Component Value Date   WBC 6.6 07/26/2019   HGB 13.0 07/26/2019   HCT 40.8 07/26/2019   MCV 81.6 07/26/2019   PLT 158 07/26/2019   Lab Results  Component Value Date   NA 139 07/26/2019   K 4.3 07/26/2019   CO2 27 07/26/2019   GLUCOSE 129 (H) 07/26/2019   BUN 18 07/26/2019   CREATININE 0.81 07/26/2019   CALCIUM 9.1 07/26/2019   ANIONGAP 6 07/26/2019   No results found for: CHOL No results found for: HDL No results found for: LDLCALC No results found for: TRIG No results found for: CHOLHDL No results found for: HGBA1C    Assessment & Plan:   Problem List Items Addressed This Visit      Cardiovascular and Mediastinum   BP (high blood pressure)    Blood pressure is stable on today's visit.  Was advised to continue taking her present medication.  Digestive   Fatty liver    She carries a diagnosis of fatty liver I told her to lose weight and follow up  with a GI specialist.        Endocrine   Chronic painful diabetic neuropathy ([place])    It is a chronic problem.  Will defer to the treatment from neurology.      Relevant Medications   traMADol (ULTRAM) 50 MG tablet   Other specified diabetes  mellitus with other specified complication (HCC) - Primary   Relevant Medications   traMADol (ULTRAM) 50 MG tablet   Other Relevant Orders   POCT glucose (manual entry) (Completed)     Other   Metabolic syndrome    - I encouraged the patient to lose weight.  - I educated them on making healthy dietary choices including eating more fruits and vegetables and less fried foods. - I encouraged the patient to exercise more, and educated on the benefits of exercise including weight loss,            Meds ordered this encounter  Medications  . traMADol (ULTRAM) 50 MG tablet    Sig: Take 1 tablet (50 mg total) by mouth 2 (two) times daily.    Dispense:  60 tablet    Refill:  0    Follow-up: Patient was advised eye exam mammogram and a foot exam.    Cletis Athens, MD

## 2020-07-11 ENCOUNTER — Ambulatory Visit: Payer: Medicare Other | Admitting: Internal Medicine

## 2020-08-20 IMAGING — CR PORTABLE CHEST - 1 VIEW
1 series · 1 of 1 positions shown · non-contrast
Comparison: 11/01/2018

CLINICAL DATA: Syncopal episode

EXAM:
PORTABLE CHEST 1 VIEW

[chest pa]
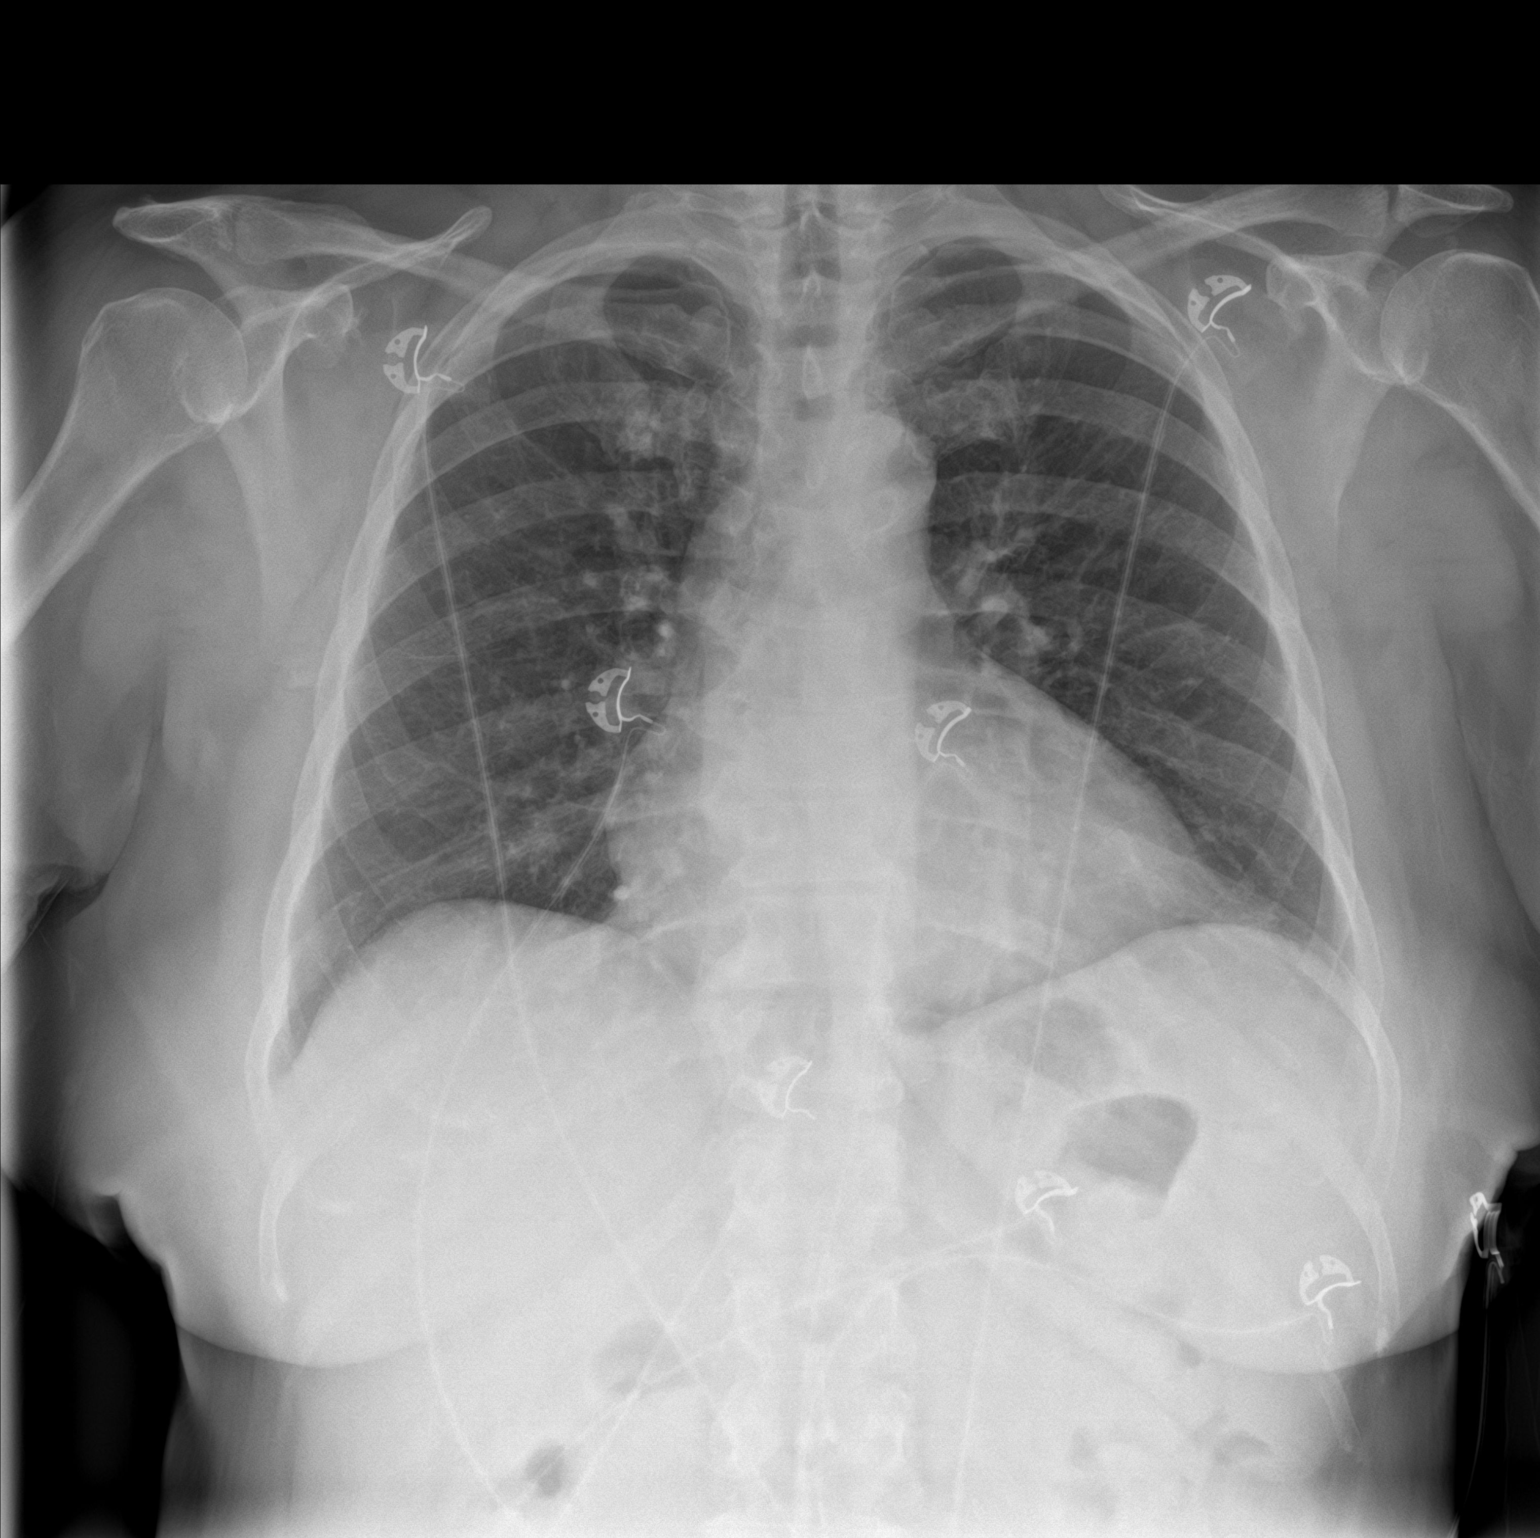

[1 of 1 positions shown; findings below may reference images not displayed]

FINDINGS: Cardiac shadow is at the upper limits of normal in size. The lungs
are clear bilaterally. No acute bony abnormality is seen.
IMPRESSION: No acute abnormality noted.

## 2020-08-20 IMAGING — US US HEPATIC LIVER DOPPLER
1 series · 13 of 25 positions shown · non-contrast
Comparison: Abdominal ultrasound-07/26/2019; abdominal
MRI-01/29/2018

CLINICAL DATA: History of cirrhosis. Please evaluate hepatic
vasculature.

EXAM:
DUPLEX ULTRASOUND OF LIVER
TECHNIQUE: Color and duplex Doppler ultrasound was performed to evaluate the
hepatic in-flow and out-flow vessels.

[Series 1: us hepatic liver doppler · 13 of 32 slices shown]
[im 1/32]
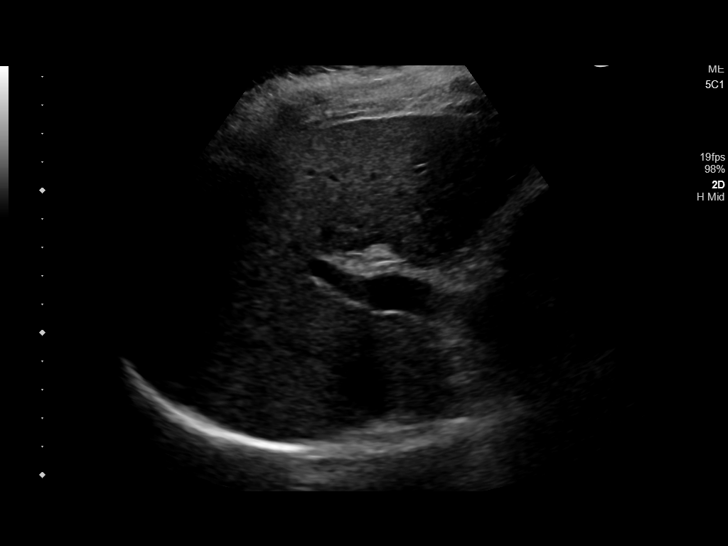
[im 3/32]
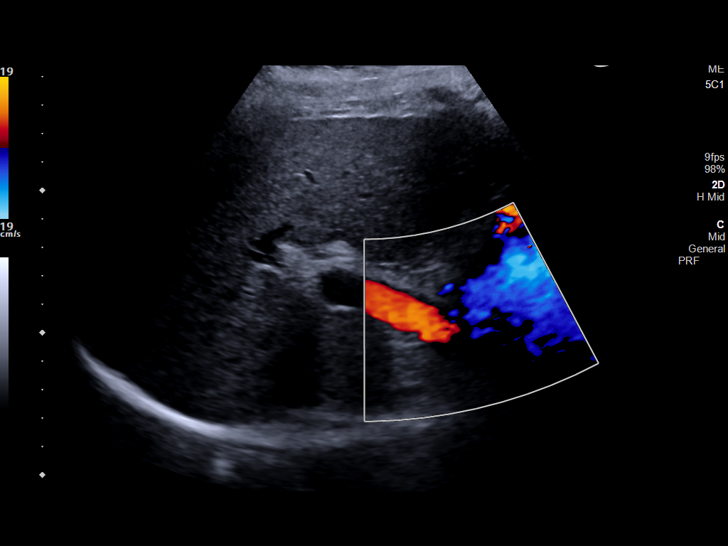
[im 6/32]
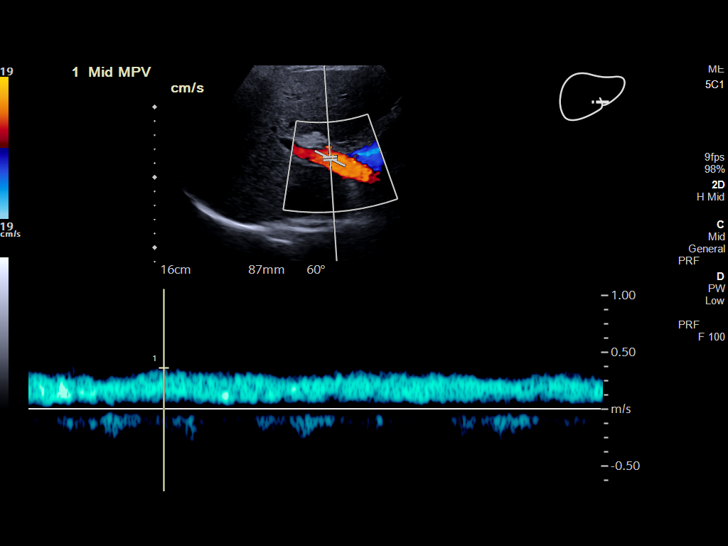
[im 8/32]
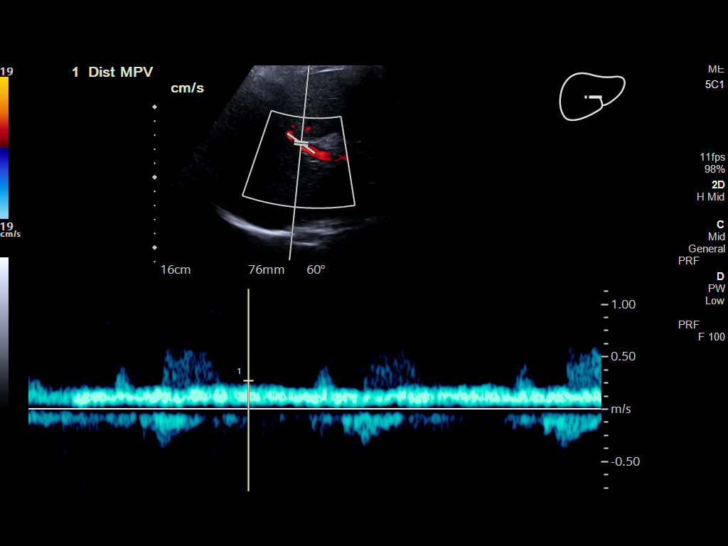
[im 11/32]
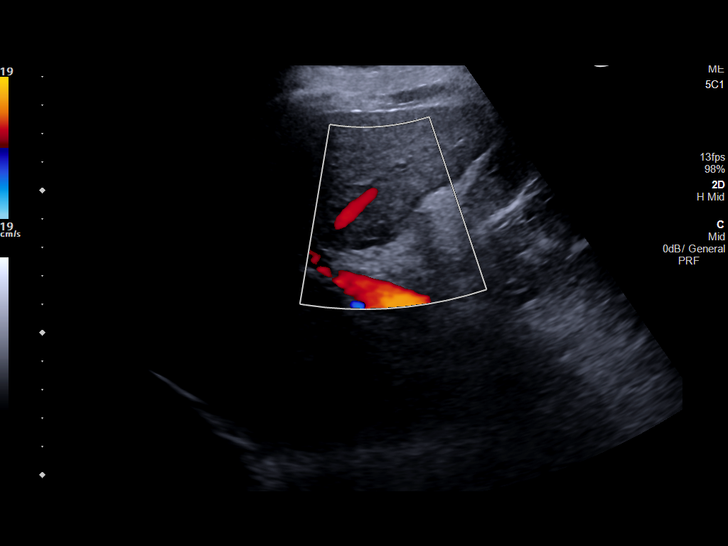
[im 13/32]
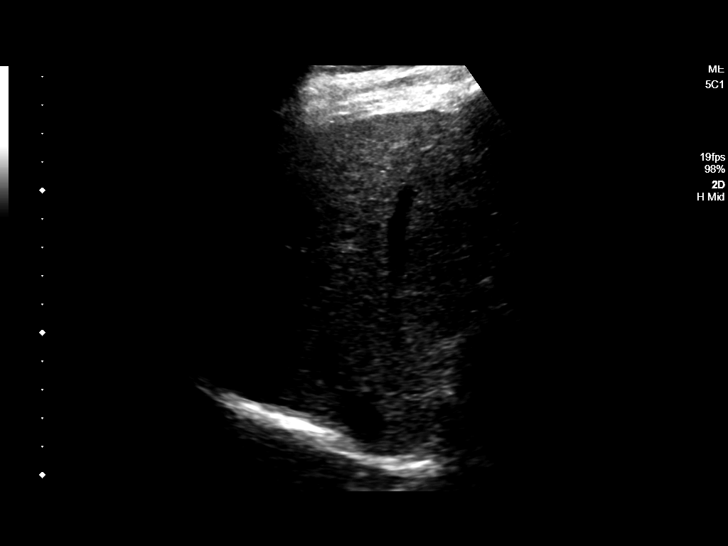
[im 16/32]
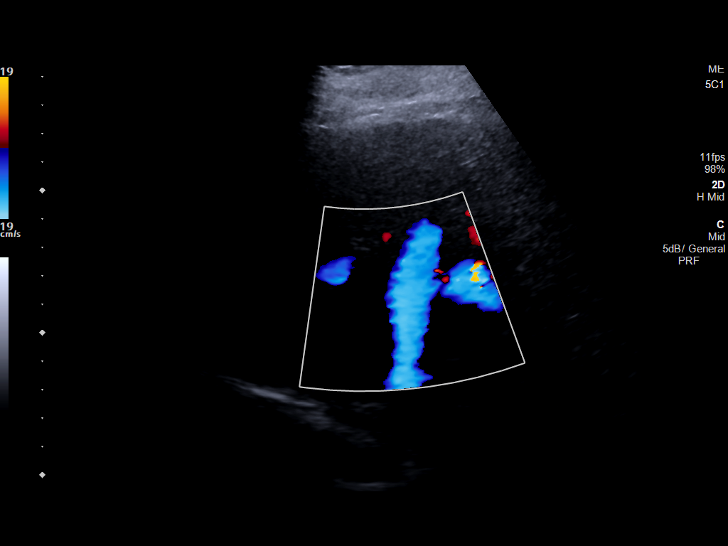
[im 19/32]
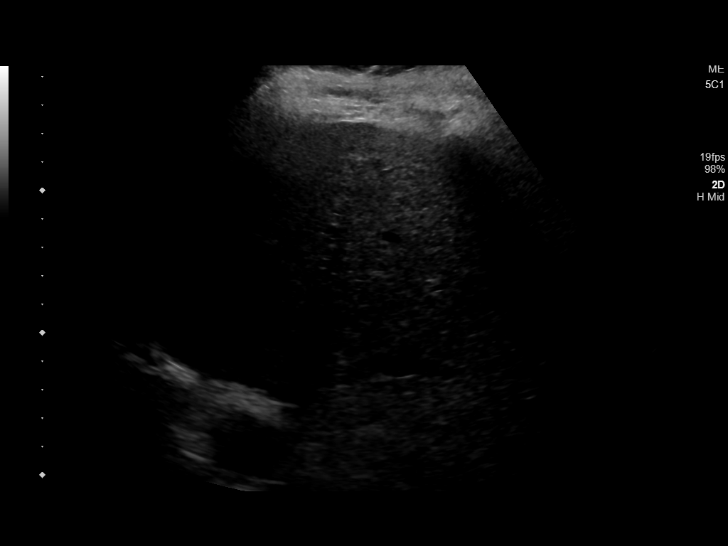
[im 21/32]
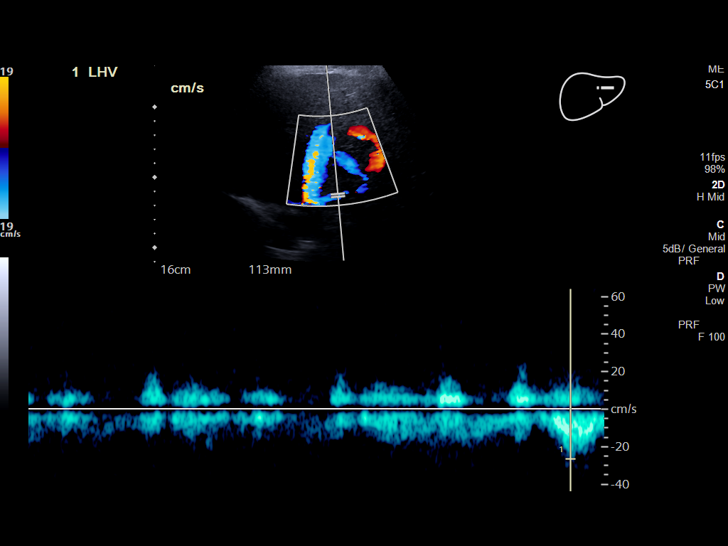
[im 24/32]
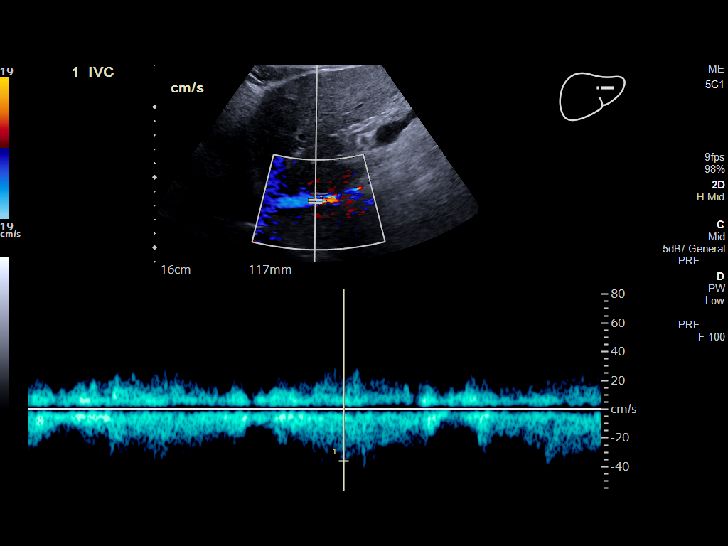
[im 26/32]
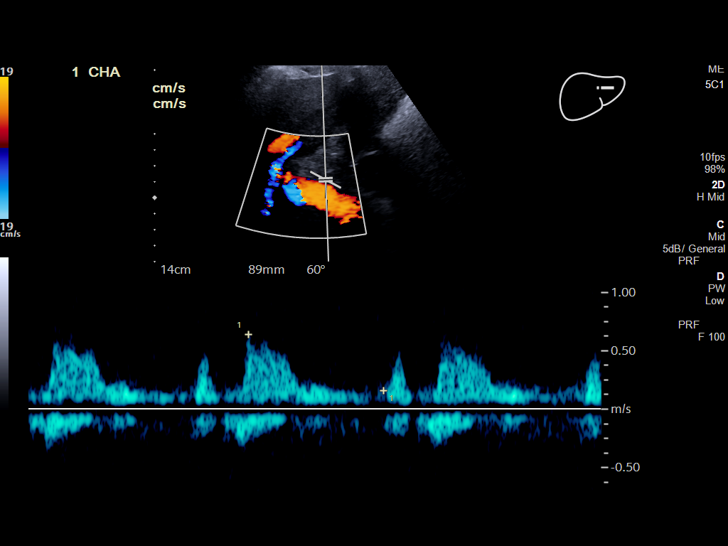
[im 29/32]
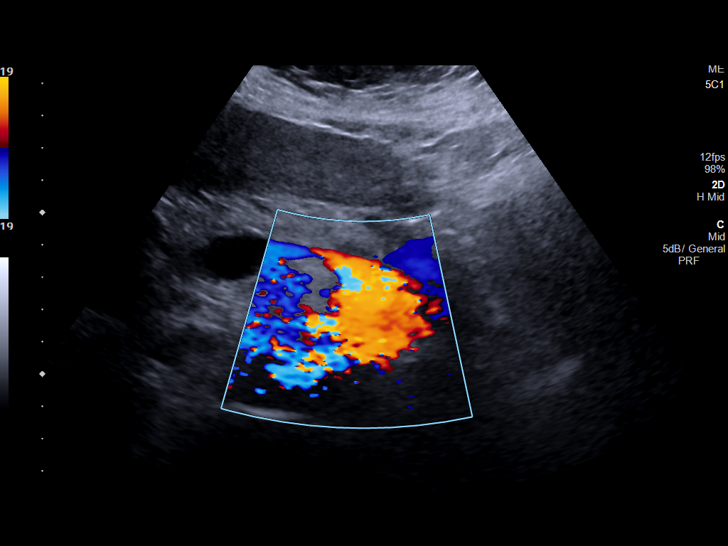
[im 32/32]
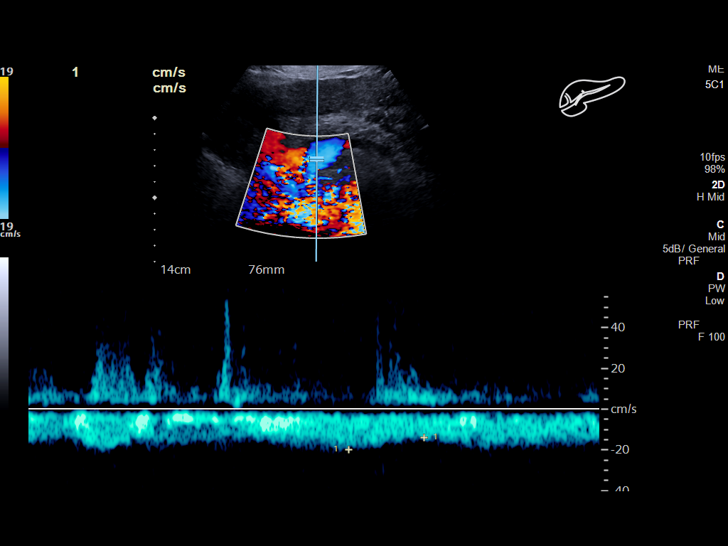

[13 of 25 positions shown; findings below may reference images not displayed]

FINDINGS: Liver: Nodular hepatic contour with coarsened hepatic echogenicity
compatible with provided history of cirrhosis. No discrete hepatic
lesions. No intrahepatic biliary duct dilatation.

Main Portal Vein size: 1.6 cm

Portal Vein Velocities

Main Prox:  44.6 cm/sec

Main Mid: 32.6 cm/sec

Main Dist:  27.1 cm/sec
Right: 23.6 cm/sec
Left: 27.3 cm/sec

Hepatic Vein Velocities

Right:  40.6 cm/sec

Middle:  35.2 cm/sec

Left:  26.5 cm/sec

IVC: Present and patent with normal respiratory phasicity.

Hepatic Artery Velocity:  63.9 cm/sec

Splenic Vein Velocity:  16.4 cm/sec

Spleen: 10.6 cm x 13.1 cm x 7.0 cm with a total volume of
cm^3 (411 cm^3 is upper limit normal)

Portal Vein Occlusion/Thrombus: No

Splenic Vein Occlusion/Thrombus: No

Ascites: None

Varices: None
IMPRESSION: 1. Normal hepatic vascular velocities and directional flow.
2. Nodular hepatic contour and coarsened echogenicity compatible
with provided history of cirrhosis.
3. Splenomegaly without evidence of ascites.

## 2020-08-20 IMAGING — CT CT HEAD WITHOUT CONTRAST
3 series · 16 of 47 positions shown, 19 images · non-contrast
Comparison: None.

CLINICAL DATA: Syncope, fall, hypoglycemia

EXAM:
CT HEAD WITHOUT CONTRAST
TECHNIQUE: Contiguous axial images were obtained from the base of the skull
through the vertex without intravenous contrast.

[Series 3: head wo · axial · 0.45mm/px · z∈[+230,+360]mm · 10 of 32 slices shown, 13 images]
[im 3/32  brain]
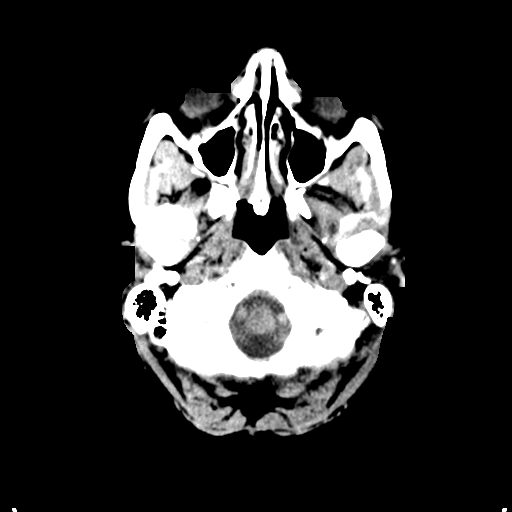
[im 3/32  bone]
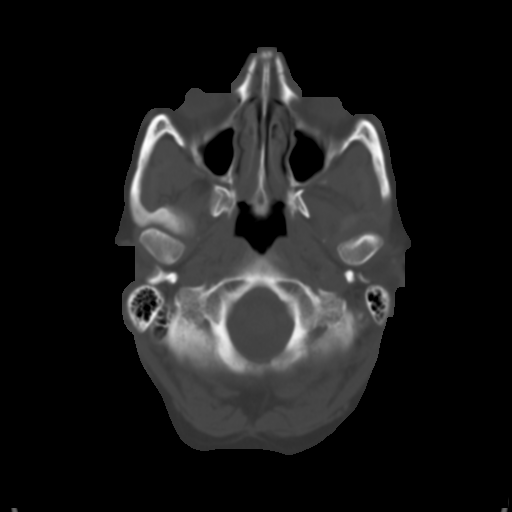
[im 6/32  brain]
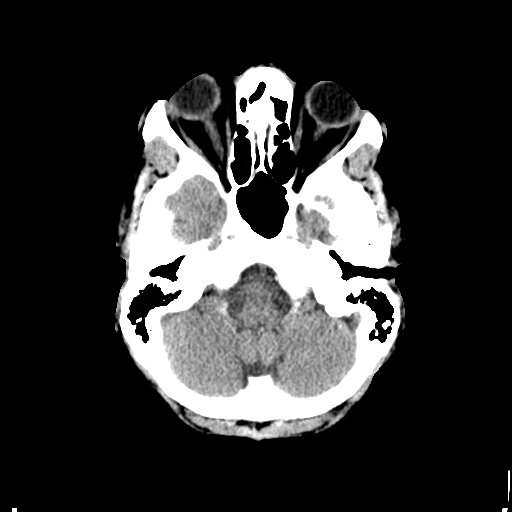
[im 9/32  brain]
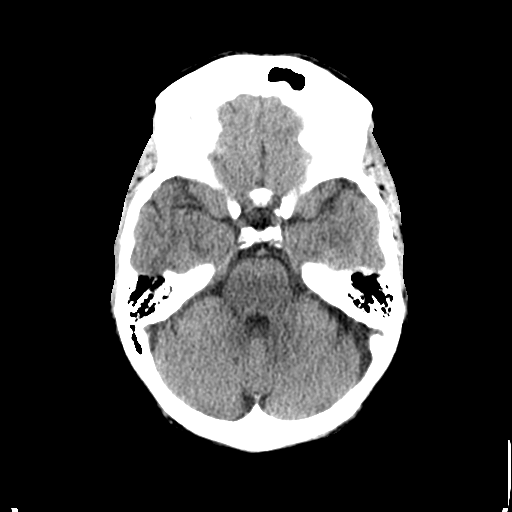
[im 11/32  brain]
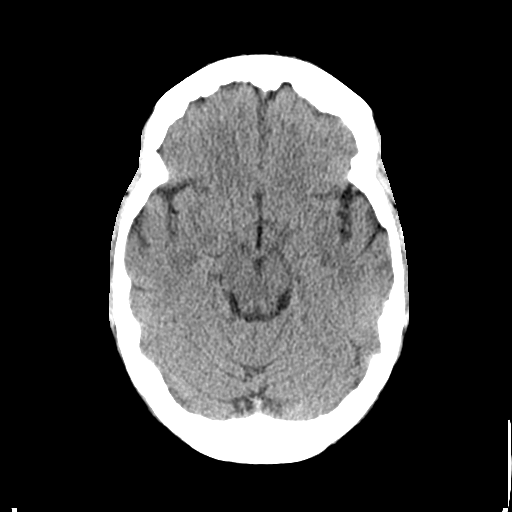
[im 14/32  brain]
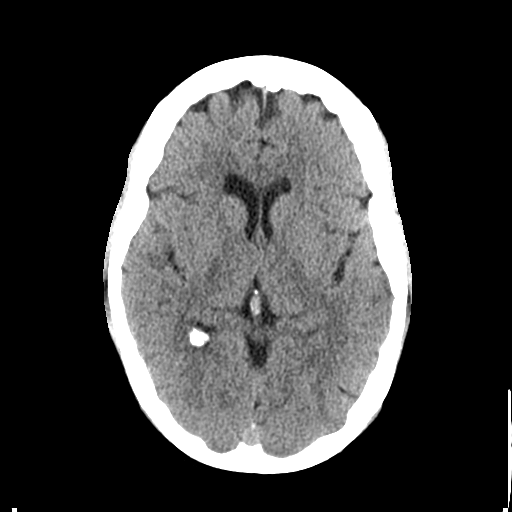
[im 14/32  bone]
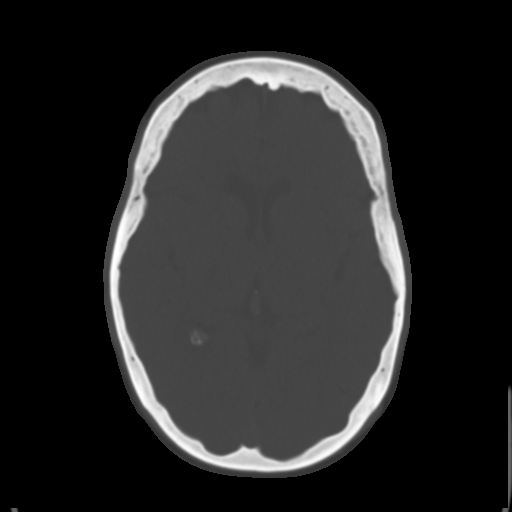
[im 18/32  brain]
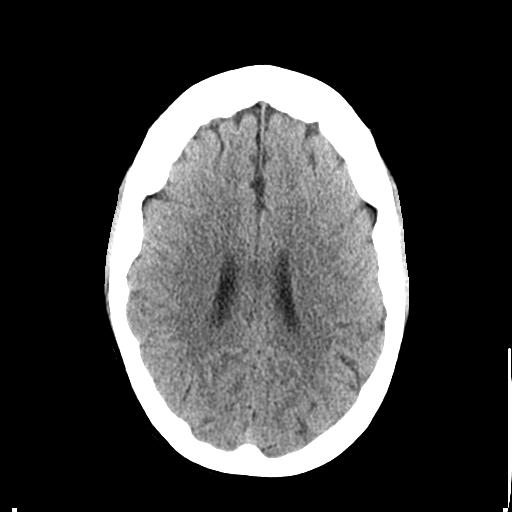
[im 21/32  brain]
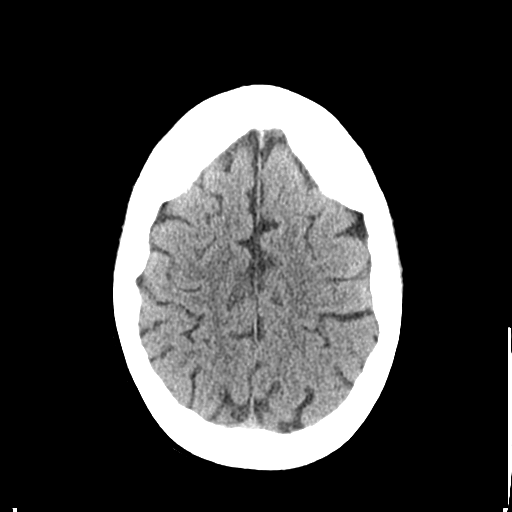
[im 24/32  brain]
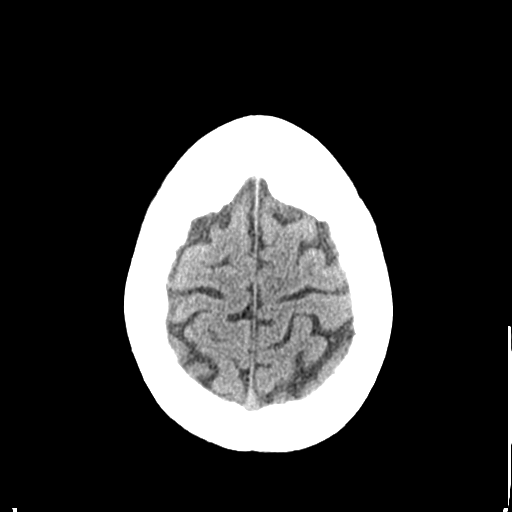
[im 26/32  brain]
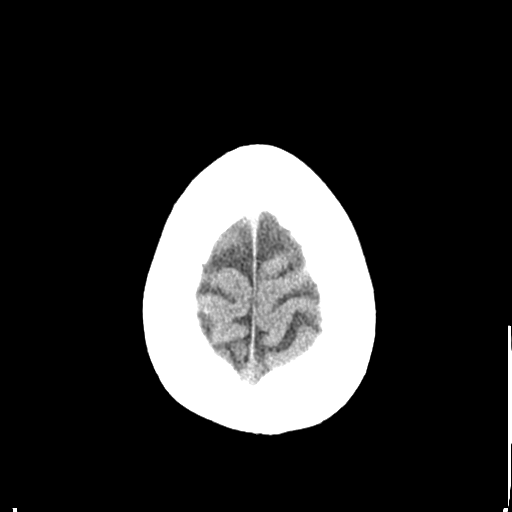
[im 26/32  bone]
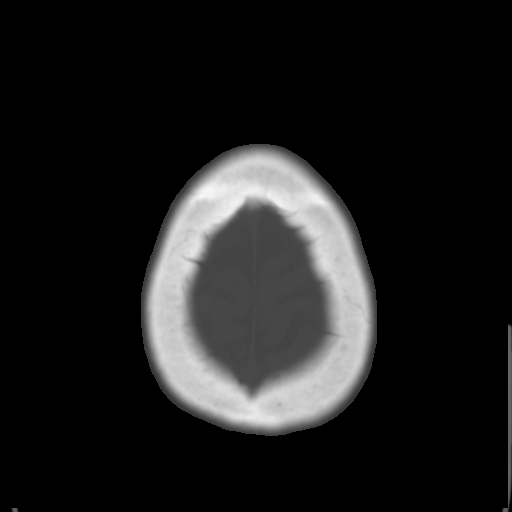
[im 29/32  brain]
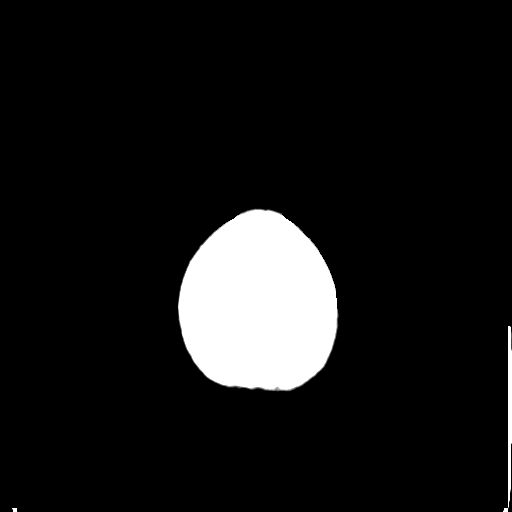

[Series 4: coronal soft tissue · coronal · 0.31mm/px · 3 of 63 slices shown]
[im 21/63  brain]
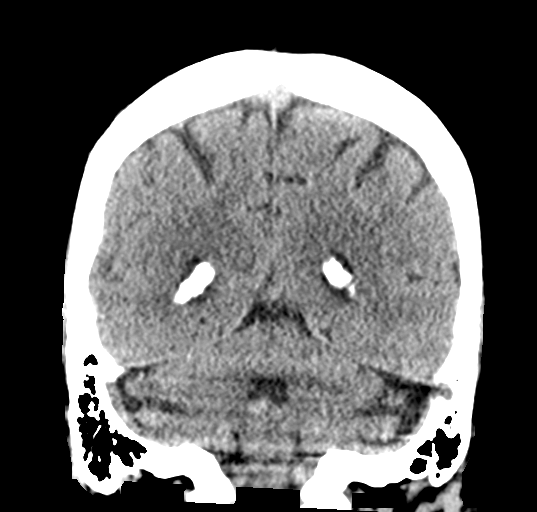
[im 28/63  brain]
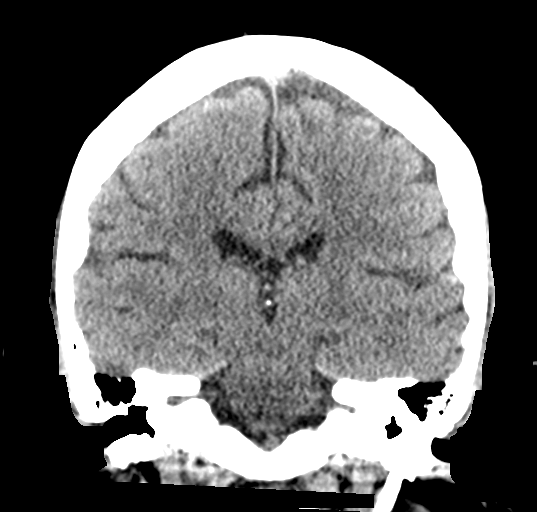
[im 35/63  brain]
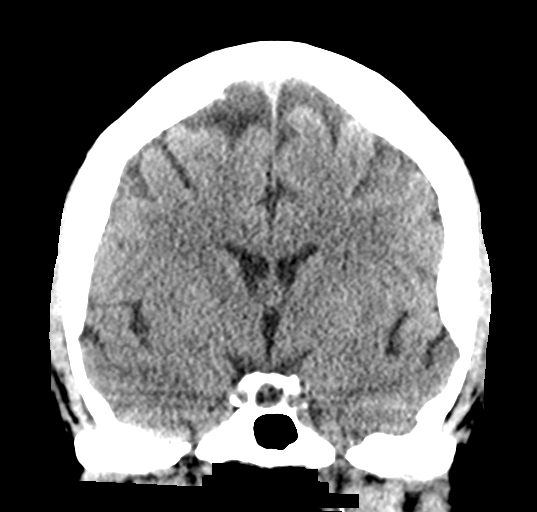

[Series 5: sagittal soft tissue · sagittal · 0.31mm/px · 3 of 50 slices shown]
[im 17/50  brain]
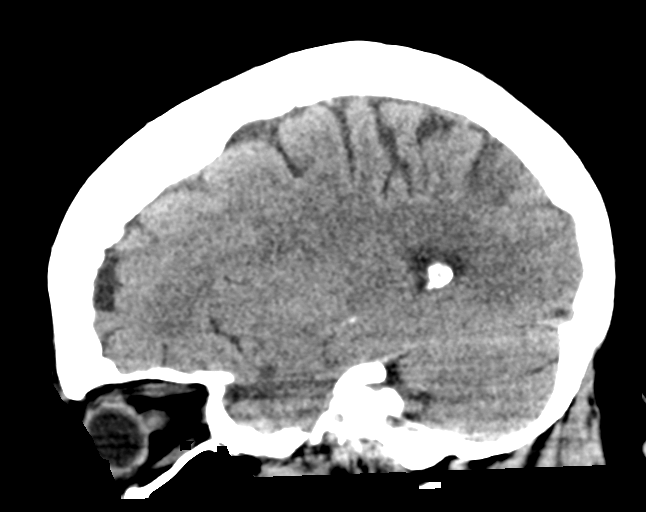
[im 25/50  brain]
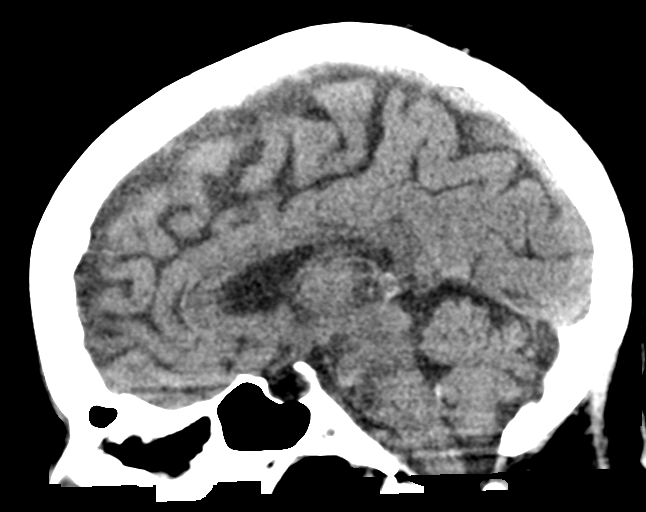
[im 33/50  brain]
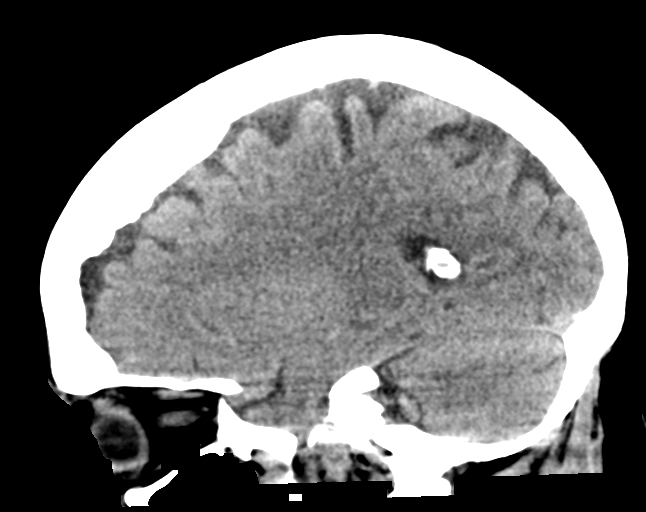

[16 of 47 positions shown; findings below may reference images not displayed]

FINDINGS: Brain: No evidence of acute infarction, hemorrhage, hydrocephalus,
extra-axial collection or mass lesion/mass effect.

Mild subcortical white matter and periventricular small vessel
ischemic changes.

Vascular: Intracranial atherosclerosis.

Skull: Normal. Negative for fracture or focal lesion.

Sinuses/Orbits: The visualized paranasal sinuses are essentially
clear. The mastoid air cells are unopacified.

Other: None.
IMPRESSION: No evidence of acute intracranial abnormality. Mild small vessel
ischemic changes.

## 2020-09-04 ENCOUNTER — Other Ambulatory Visit: Payer: Self-pay | Admitting: Internal Medicine

## 2020-09-04 DIAGNOSIS — E1369 Other specified diabetes mellitus with other specified complication: Secondary | ICD-10-CM

## 2020-10-02 ENCOUNTER — Emergency Department
Admission: EM | Admit: 2020-10-02 | Discharge: 2020-10-02 | Disposition: A | Discharge status: Home / Self Care | Payer: Medicare Other | Attending: Emergency Medicine | Admitting: Emergency Medicine

## 2020-10-02 ENCOUNTER — Other Ambulatory Visit: Payer: Self-pay

## 2020-10-02 DIAGNOSIS — Z5321 Procedure and treatment not carried out due to patient leaving prior to being seen by health care provider: Secondary | ICD-10-CM | POA: Insufficient documentation

## 2020-10-02 DIAGNOSIS — R739 Hyperglycemia, unspecified: Secondary | ICD-10-CM | POA: Diagnosis present

## 2020-10-02 LAB — BASIC METABOLIC PANEL
Anion gap: 9 (ref 5–15)
BUN: 19 mg/dL (ref 8–23)
CO2: 31 mmol/L (ref 22–32)
Calcium: 8.9 mg/dL (ref 8.9–10.3)
Chloride: 96 mmol/L — ABNORMAL LOW (ref 98–111)
Creatinine, Ser: 0.82 mg/dL (ref 0.44–1.00)
GFR, Estimated: >60 mL/min (ref >60)
Glucose, Bld: 408 mg/dL — ABNORMAL HIGH (ref 70–99)
Potassium: 3.2 mmol/L — ABNORMAL LOW (ref 3.5–5.1)
Sodium: 136 mmol/L (ref 135–145)

## 2020-10-02 LAB — CBC
HCT: 36.3 % (ref 36.0–46.0)
Hemoglobin: 11.8 g/dL — ABNORMAL LOW (ref 12.0–15.0)
MCH: 24.6 pg — ABNORMAL LOW (ref 26.0–34.0)
MCHC: 32.5 g/dL (ref 30.0–36.0)
MCV: 75.8 fL — ABNORMAL LOW (ref 80.0–100.0)
Platelets: 159 10*3/uL (ref 150–400)
RBC: 4.79 MIL/uL (ref 3.87–5.11)
RDW: 14.4 % (ref 11.5–15.5)
WBC: 8.4 10*3/uL (ref 4.0–10.5)
nRBC: 0 % (ref 0.0–0.2)

## 2020-10-02 NOTE — ED Triage Notes (Signed)
Patient reports she had elevated blood sugar of >500 at home. Patient placed on new diabetes medication 1 month ago. Patient denies any symptoms. Patient A&OX3.

## 2020-10-03 LAB — CBG MONITORING, ED: Glucose-Capillary: 382 mg/dL — ABNORMAL HIGH (ref 70–99)

## 2020-12-30 ENCOUNTER — Other Ambulatory Visit: Payer: Self-pay | Admitting: Internal Medicine

## 2021-01-02 ENCOUNTER — Other Ambulatory Visit: Payer: Self-pay | Admitting: Internal Medicine

## 2021-01-04 ENCOUNTER — Other Ambulatory Visit: Payer: Self-pay

## 2021-01-04 ENCOUNTER — Encounter: Payer: Self-pay | Admitting: Emergency Medicine

## 2021-01-04 ENCOUNTER — Emergency Department
Admission: EM | Admit: 2021-01-04 | Discharge: 2021-01-04 | Disposition: A | Discharge status: Home / Self Care | Payer: Medicare Other | Attending: Emergency Medicine | Admitting: Emergency Medicine

## 2021-01-04 DIAGNOSIS — Z7982 Long term (current) use of aspirin: Secondary | ICD-10-CM | POA: Diagnosis not present

## 2021-01-04 DIAGNOSIS — J45909 Unspecified asthma, uncomplicated: Secondary | ICD-10-CM | POA: Diagnosis not present

## 2021-01-04 DIAGNOSIS — E1165 Type 2 diabetes mellitus with hyperglycemia: Secondary | ICD-10-CM

## 2021-01-04 DIAGNOSIS — Z79899 Other long term (current) drug therapy: Secondary | ICD-10-CM | POA: Insufficient documentation

## 2021-01-04 DIAGNOSIS — I10 Essential (primary) hypertension: Secondary | ICD-10-CM | POA: Diagnosis not present

## 2021-01-04 DIAGNOSIS — R739 Hyperglycemia, unspecified: Secondary | ICD-10-CM

## 2021-01-04 DIAGNOSIS — E1140 Type 2 diabetes mellitus with diabetic neuropathy, unspecified: Secondary | ICD-10-CM | POA: Insufficient documentation

## 2021-01-04 DIAGNOSIS — Z7984 Long term (current) use of oral hypoglycemic drugs: Secondary | ICD-10-CM | POA: Insufficient documentation

## 2021-01-04 LAB — CBC
HCT: 30.9 % — ABNORMAL LOW (ref 36.0–46.0)
Hemoglobin: 10.1 g/dL — ABNORMAL LOW (ref 12.0–15.0)
MCH: 25.3 pg — ABNORMAL LOW (ref 26.0–34.0)
MCHC: 32.7 g/dL (ref 30.0–36.0)
MCV: 77.3 fL — ABNORMAL LOW (ref 80.0–100.0)
Platelets: 161 10*3/uL (ref 150–400)
RBC: 4 MIL/uL (ref 3.87–5.11)
RDW: 17.1 % — ABNORMAL HIGH (ref 11.5–15.5)
WBC: 6.6 10*3/uL (ref 4.0–10.5)
nRBC: 0 % (ref 0.0–0.2)

## 2021-01-04 LAB — BASIC METABOLIC PANEL
Anion gap: 12 (ref 5–15)
BUN: 22 mg/dL (ref 8–23)
CO2: 23 mmol/L (ref 22–32)
Calcium: 9 mg/dL (ref 8.9–10.3)
Chloride: 97 mmol/L — ABNORMAL LOW (ref 98–111)
Creatinine, Ser: 1.01 mg/dL — ABNORMAL HIGH (ref 0.44–1.00)
GFR, Estimated: 60 mL/min — ABNORMAL LOW (ref >60)
Glucose, Bld: 485 mg/dL — ABNORMAL HIGH (ref 70–99)
Potassium: 4.1 mmol/L (ref 3.5–5.1)
Sodium: 132 mmol/L — ABNORMAL LOW (ref 135–145)

## 2021-01-04 LAB — CBG MONITORING, ED
Glucose-Capillary: 472 mg/dL — ABNORMAL HIGH (ref 70–99)
Glucose-Capillary: 241 mg/dL — ABNORMAL HIGH (ref 70–99)

## 2021-01-04 MED ORDER — SODIUM CHLORIDE 0.9 % IV BOLUS
1000.0000 mL | Freq: Once | INTRAVENOUS | Status: AC
  Administered 2021-01-04: 1000 mL via INTRAVENOUS

## 2021-01-04 MED ORDER — INSULIN ASPART 100 UNIT/ML ~~LOC~~ SOLN
10.0000 [IU] | Freq: Once | SUBCUTANEOUS | Status: AC
  Administered 2021-01-04: 10 [IU] via INTRAVENOUS
  Filled 2021-01-04: qty 1

## 2021-01-04 NOTE — ED Notes (Signed)
Pt presents to ED with c/o of high blood sugar, pt states sugars in the 500 is what brought her into the ER. Pt denies N/V/D at this time. Pt is A&Ox4. NAD noted.

## 2021-01-04 NOTE — ED Triage Notes (Signed)
Pt to ER states CBG at home 531.  States sugars have been running high, but not that high.  States PCP told her to come her for evaluation.

## 2021-01-04 NOTE — ED Provider Notes (Signed)
[facility] Emergency Department Provider Note  ____________________________________________  Time seen: Approximately 3:40 PM  I have reviewed the triage vital signs and the nursing notes.   HISTORY  Chief Complaint Hyperglycemia    HPI Laura [name] is a 70 y.o. female who presents the emergency department complaining of hyperglycemia.  Patient states that she has had difficulty controlling her blood sugars over the past couple months.  Her husband passed away and she has had a lot of stress, anxiety and lifestyle changes.  She says that she is a type II diabetic and is noted very fluctuating blood sugars.  She states that sometimes they are high and sometimes it is low.  She does not use insulin.  Patient states that she was started on a pill and liquid medicine for a chronic cough and her blood sugar kept rising.  She states that she called her primary care who advised her that was likely secondary to the medicine that she should be evaluated as her home blood glucose was 531.  Patient states that she is going to see an endocrinologist to help manage her diabetes but they were concerned that she may have a problem today.  Patient states that the URI symptoms are chronic during the winter that she has had no fevers or chills, difficulty breathing, chest pain, shortness of breath, domino pain, nausea or vomiting.  Patient's only complaint today is uncontrolled blood sugar         Past Medical History:  Diagnosis Date  . Allergy   . Anxiety   . Asthma   . Chronic back pain   . Chronic cough 07/01/10   PFT  FEV1 2.20 (93%), FEV 1% 81, TLC 4,12 (81%), DLCO 78%, no BD. normal chest CT, sinus 07/08/10  . 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 Depression   . Diabetes mellitus type 2, uncomplicated ([place])   . Diabetes mellitus without complication ([place])   . Diabetic neuropathy ([place])   . Diverticulosis   . Dysrhythmia    H/O PALPITATIONS-SINCE ABLATION IN  2013 AT [facility], PALPITATIONS MUCH BETTER  . GERD (gastroesophageal reflux disease)    NO MEDS  . Hemorrhoids   . HTN (hypertension)   . NAFLD (nonalcoholic fatty liver disease) 01/18/2018  . Neuropathy   . Osteoarthritis   . Sleep apnea    NO CPAP    Patient Active Problem List   Diagnosis Date Noted  . Metabolic syndrome 85/46/2703  . Other specified diabetes mellitus with other specified complication ([place]) 50/08/3817  . Fatty liver 06/28/2020  . Complex regional pain syndrome 07/21/2016  . Lumbar radiculopathy 07/21/2016  . DDD (degenerative disc disease), lumbar 07/02/2016  . Facet syndrome, lumbar 07/02/2016  . Allergic state 02/28/2016  . Arthritis 02/28/2016  . Acid reflux 02/28/2016  . BP (high blood pressure) 02/28/2016  . Chronic painful diabetic neuropathy ([place]) 02/05/2016  . Difficulty sleeping 02/05/2016  . Diabetic neuropathy ([place]) 01/31/2016  . Reflux 11/19/2011  . GERD 09/19/2010  . OTHER DISEASES OF TRACHEA AND BRONCHUS 08/14/2010  . FATTY LIVER DISEASE 07/22/2010  . Cough 06/04/2010  . ANXIETY DEPRESSION 05/18/2009  . HYPERTENSION, UNSPECIFIED 05/18/2009  . PALPITATIONS 05/18/2009    Past Surgical History:  Procedure Laterality Date  . BRONCHOSCOPY  07/25/10  . CARDIAC ELECTROPHYSIOLOGY STUDY AND ABLATION    . CARPAL TUNNEL RELEASE    . CATARACT EXTRACTION Bilateral Jan 2017  . cathater ablation     for arrhythmia  . COLONOSCOPY  2014  .  COLONOSCOPY WITH PROPOFOL N/A 09/07/2019   Procedure: COLONOSCOPY WITH PROPOFOL;  Surgeon: Toledo, Benay Pike, MD;  Location: [facility] ENDOSCOPY;  Service: Gastroenterology;  Laterality: N/A;  . ESOPHAGOGASTRODUODENOSCOPY (EGD) WITH PROPOFOL N/A 07/05/2018   Procedure: ESOPHAGOGASTRODUODENOSCOPY (EGD) WITH PROPOFOL;  Surgeon: Manya Silvas, MD;  Location: [facility] ENDOSCOPY;  Service: Endoscopy;  Laterality: N/A;  . FOOT SURGERY    . HEMORROIDECTOMY    . LIPOMA EXCISION Right 10/20/2016   Procedure: EXCISION LIPOMA;   Surgeon: Christene Lye, MD;  Location: [facility] ORS;  Service: General;  Laterality: Right;  . PARTIAL HYSTERECTOMY    . TOENAIL EXCISION Right     Prior to Admission medications   Medication Sig Start Date End Date Taking? Authorizing Provider  albuterol (PROVENTIL HFA;VENTOLIN HFA) 108 (90 Base) MCG/ACT inhaler Inhale 2 puffs into the lungs every 6 (six) hours as needed for wheezing or shortness of breath.    [provider]  aspirin EC 81 MG tablet Take 81 mg by mouth daily.    [provider]  DULoxetine (CYMBALTA) 30 MG capsule Limit 2 capsules by mouth per day if tolerated Patient taking differently: Take 60 mg by mouth 2 (two) times daily. Limit 2 capsules by mouth per day if tolerated 08/18/16   Mohammed Kindle, MD  gabapentin (NEURONTIN) 300 MG capsule TAKE 2 CAPSULES BY MOUTH 4 TIMES DAILY 01/02/21   Cletis Athens, MD  glipiZIDE (GLUCOTROL XL) 5 MG 24 hr tablet Take 5 mg by mouth 2 (two) times daily.  01/29/16   [provider]  lisinopril (PRINIVIL,ZESTRIL) 10 MG tablet Take 10 mg by mouth every morning. Reported on 06/02/2016 02/16/16   [provider]  metFORMIN (GLUCOPHAGE) 1000 MG tablet Take 1,000 mg by mouth 2 (two) times daily with a meal.    [provider]  pantoprazole (PROTONIX) 40 MG tablet Take 40 mg by mouth daily. 15-20 mins before meal    [provider]  traMADol (ULTRAM) 50 MG tablet Take 1 tablet (50 mg total) by mouth 2 (two) times daily. 06/28/20   Cletis Athens, MD  traZODone (DESYREL) 50 MG tablet Limit 1 tablet by mouth at bedtime when necessary for insomnia if tolerated Patient taking differently: 100 mg. Limit 1 tablet by mouth at bedtime when necessary for insomnia if tolerated 08/18/16   Mohammed Kindle, MD    Allergies Atenolol, Clarithromycin, Doxycycline, Sulfa antibiotics, Erythromycin base, and Penicillins  Family History  Problem Relation Age of Onset  . Cancer Mother        stomach  . Cancer  Father        brain  . Heart disease Father   . Cancer Brother        mouth  . Heart disease Brother   . Ovarian cancer Maternal Grandmother     Social History Social History   Tobacco Use  . Smoking status: Never Smoker  . Smokeless tobacco: Never Used  Substance Use Topics  . Alcohol use: No    Alcohol/week: 0.0 standard drinks  . Drug use: No     Review of Systems  Constitutional: No fever/chills.  Varying blood sugar readings over the past couple of months, home reading of 531 prior to arrival Eyes: No visual changes. No discharge ENT: No upper respiratory complaints. Cardiovascular: no chest pain. Respiratory: no cough. No SOB. Gastrointestinal: No abdominal pain.  No nausea, no vomiting.  No diarrhea.  No constipation. Genitourinary: Negative for dysuria. No hematuria Musculoskeletal: Negative for musculoskeletal pain. Skin: Negative  for rash, abrasions, lacerations, ecchymosis. Neurological: Negative for headaches, focal weakness or numbness.  10 System ROS otherwise negative.  ____________________________________________   PHYSICAL EXAM:  VITAL SIGNS: ED Triage Vitals  Enc Vitals Group     BP 01/04/21 1420 (!) 118/52     Pulse Rate 01/04/21 1420 68     Resp 01/04/21 1420 18     Temp 01/04/21 1420 98.4 F (36.9 C)     Temp Source 01/04/21 1420 Oral     SpO2 01/04/21 1420 96 %     Weight 01/04/21 1421 185 lb (83.9 kg)     Height 01/04/21 1421 5\' 5"  (1.651 m)     Head Circumference --      Peak Flow --      Pain Score --      Pain Loc --      Pain Edu? --      Excl. in [place]? --      Constitutional: Alert and oriented. Well appearing and in no acute distress. Eyes: Conjunctivae are normal. PERRL. EOMI. Head: Atraumatic. ENT:      Ears:       Nose: No congestion/rhinnorhea.      Mouth/Throat: Mucous membranes are moist.  Neck: No stridor.    Cardiovascular: Normal rate, regular rhythm. Normal S1 and S2.  Good peripheral circulation. Respiratory:  Normal respiratory effort without tachypnea or retractions. Lungs CTAB. Good air entry to the bases with no decreased or absent breath sounds. Gastrointestinal: Bowel sounds 4 quadrants. Soft and nontender to palpation. No guarding or rigidity. No palpable masses. No distention. No CVA tenderness. Musculoskeletal: Full range of motion to all extremities. No gross deformities appreciated. Neurologic:  Normal speech and language. No gross focal neurologic deficits are appreciated.  Skin:  Skin is warm, dry and intact. No rash noted. Psychiatric: Mood and affect are normal. Speech and behavior are normal. Patient exhibits appropriate insight and judgement.   ____________________________________________   LABS (all labs ordered are listed, but only abnormal results are displayed)  Labs Reviewed  BASIC METABOLIC PANEL - Abnormal; Notable for the following components:      Result Value   Sodium 132 (*)    Chloride 97 (*)    Glucose, Bld 485 (*)    Creatinine, Ser 1.01 (*)    GFR, Estimated 60 (*)    All other components within normal limits  CBC - Abnormal; Notable for the following components:   Hemoglobin 10.1 (*)    HCT 30.9 (*)    MCV 77.3 (*)    MCH 25.3 (*)    RDW 17.1 (*)    All other components within normal limits  CBG MONITORING, ED - Abnormal; Notable for the following components:   Glucose-Capillary 472 (*)    All other components within normal limits  CBG MONITORING, ED - Abnormal; Notable for the following components:   Glucose-Capillary 241 (*)    All other components within normal limits  URINALYSIS, COMPLETE (UACMP) WITH MICROSCOPIC  CBG MONITORING, ED   ____________________________________________  EKG   ____________________________________________  RADIOLOGY   No results found.  ____________________________________________    PROCEDURES  Procedure(s) performed:    Procedures    Medications  sodium chloride 0.9 % bolus 1,000 mL (0 mLs  Intravenous Stopped 01/04/21 1826)  insulin aspart (novoLOG) injection 10 Units (10 Units Intravenous Given 01/04/21 1644)     ____________________________________________   INITIAL IMPRESSION / ASSESSMENT AND PLAN / ED COURSE  Pertinent labs & imaging results that were  available during my care of the patient were reviewed by me and considered in my medical decision making (see chart for details).  Review of the [place] CSRS was performed in accordance of the [place] prior to dispensing any controlled drugs.           Patient's diagnosis is consistent with hyperglycemia.  Patient presented to emergency department complaining of elevated blood glucose readings.  Patient is a type II diabetic and states that her blood sugars have been varying significantly over the past couple of months.  Patient lost her husband several months ago and has since had difficulty controlling her blood sugars.  I feel that this is likely in large part contributed to stress and likely changes in her regular eating patterns.  Patient started 2 medications for a chronic cough and states that she had rising blood sugars in her home reading of 531 today.  Patient was referred to the emergency department by her primary care for evaluation for potential complications such as DKA.  Patient is intact neurologically, no complaints other than elevated glucose reading.  No evidence of DKA on labs.  Patient will be given fluids and insulin here.  She states that her primary care has scheduled her to see an endocrinologist to further manage her diabetes and at this point I do not feel like changing medications or adding medications is warranted at this time.  After fluids, 10 units of insulin patient's blood glucose had improved to 241.  Exam remains reassuring and patient stable for discharge.  Patient will have close follow-up with endocrinology to further manage her diabetes in the near future.  Return precautions will be discussed with the  patient at length..Patient is given ED precautions to return to the ED for any worsening or new symptoms.     ____________________________________________  FINAL CLINICAL IMPRESSION(S) / ED DIAGNOSES  Final diagnoses:  Hyperglycemia  Type 2 diabetes mellitus with hyperglycemia, without long-term current use of insulin (HCC)      NEW MEDICATIONS STARTED DURING THIS VISIT:  ED Discharge Orders    None          This chart was dictated using voice recognition software/Dragon. Despite best efforts to proofread, errors can occur which can change the meaning. Any change was purely unintentional.    Darletta Moll, PA-C 01/04/21 1836    Nance Pear, MD 01/04/21 2240

## 2021-01-10 ENCOUNTER — Other Ambulatory Visit: Payer: Self-pay

## 2021-01-10 ENCOUNTER — Encounter: Payer: Self-pay | Admitting: *Deleted

## 2021-01-10 ENCOUNTER — Encounter: Payer: Medicare Other | Attending: Family Medicine | Admitting: *Deleted

## 2021-01-10 VITALS — BP 110/62 | Ht 63.0 in | Wt 179.5 lb

## 2021-01-10 DIAGNOSIS — E119 Type 2 diabetes mellitus without complications: Secondary | ICD-10-CM | POA: Diagnosis not present

## 2021-01-10 DIAGNOSIS — E1165 Type 2 diabetes mellitus with hyperglycemia: Secondary | ICD-10-CM

## 2021-01-10 NOTE — Progress Notes (Signed)
Diabetes Self-Management Education  Visit Type: First/Initial  Appt. Start Time: 1345 Appt. End Time: 2979  01/10/2021  Ms. Laura [name], identified by name and date of birth, is a 70 y.o. female with a diagnosis of Diabetes: Type 2.   ASSESSMENT  Blood pressure 110/62, height 5\' 3"  (1.6 m), weight 179 lb 8 oz (81.4 kg). Body mass index is 31.8 kg/m.   Diabetes Self-Management Education - 01/10/21 1525      Visit Information   Visit Type First/Initial      Initial Visit   Diabetes Type Type 2    Are you currently following a meal plan? No    Are you taking your medications as prescribed? Yes    Date Diagnosed 7 years      Health Coping   How would you rate your overall health? Fair      Psychosocial Assessment   Patient Belief/Attitude about Diabetes Other (comment)   "Yuck"   Self-care barriers None    Self-management support Doctor's office;Friends;Family    Other persons present Friend    Patient Concerns Nutrition/Meal planning;Glycemic Control;Medication;Monitoring;Healthy Lifestyle    Special Needs None    Preferred Learning Style Visual;Auditory    Learning Readiness Ready    How often do you need to have someone help you when you read instructions, pamphlets, or other written materials from your doctor or pharmacy? 2 - Rarely   Her friend helped with paper work due to patient's vision from high blood sugars   What is the last grade level you completed in school? 12th      Pre-Education Assessment   Patient understands the diabetes disease and treatment process. Needs Instruction    Patient understands incorporating nutritional management into lifestyle. Needs Instruction    Patient undertands incorporating physical activity into lifestyle. Needs Instruction    Patient understands using medications safely. Needs Instruction    Patient understands monitoring blood glucose, interpreting and using results Needs Review    Patient understands prevention, detection,  and treatment of acute complications. Needs Instruction    Patient understands prevention, detection, and treatment of chronic complications. Needs Instruction    Patient understands how to develop strategies to address psychosocial issues. Needs Instruction    Patient understands how to develop strategies to promote health/change behavior. Needs Instruction      Complications   Last HgB A1C per patient/outside source 9.5 %   Jan 2022   How often do you check your blood sugar? 1-2 times/day    Fasting Blood glucose range (mg/dL) 180-200;>200   Pt reports FBG of 180-260's mg/dL.   Postprandial Blood glucose range (mg/dL) --   Pt checks before supper wtih readings of 200-530's mg/dL.   Have you had a dilated eye exam in the past 12 months? Yes    Have you had a dental exam in the past 12 months? Yes    Are you checking your feet? Yes    How many days per week are you checking your feet? 3      Dietary Intake   Breakfast grits; cereal and milk (pt reports her daughter will not let her eat certain foods like fruits because it has sugar)   Snack (morning) reports 1-2 snacks/day - crackers    Lunch sometimes skips meals - 1/2 meat sandwich; fish; crackers    Dinner chicken; potatoes, peas, corn, green beans, rice, broccoli, tomatoes, cauliflower, squash, zucchini, cuccumbers, lettuce    Beverage(s) water, juice, coffee with sugar  Exercise   Exercise Type ADL's      Patient Education   Previous Diabetes Education No    Disease state  Definition of diabetes, type 1 and 2, and the diagnosis of diabetes;Factors that contribute to the development of diabetes;Explored patient's options for treatment of their diabetes    Nutrition management  Role of diet in the treatment of diabetes and the relationship between the three main macronutrients and blood glucose level;Food label reading, portion sizes and measuring food.;Reviewed blood glucose goals for pre and post meals and how to evaluate the  patients' food intake on their blood glucose level.;Meal timing in regards to the patients' current diabetes medication.    Physical activity and exercise  Role of exercise on diabetes management, blood pressure control and cardiac health.    Medications Reviewed patients medication for diabetes, action, purpose, timing of dose and side effects.    Monitoring Purpose and frequency of SMBG.;Taught/discussed recording of test results and interpretation of SMBG.;Identified appropriate SMBG and/or A1C goals.    Acute complications Discussed and identified patients' treatment of hyperglycemia.    Chronic complications Relationship between chronic complications and blood glucose control    Psychosocial adjustment Role of stress on diabetes;Identified and addressed patients feelings and concerns about diabetes      Individualized Goals (developed by patient)   Reducing Risk Other (comment)   improve blood sugars, decrease medications, prevent diabetes complications, lead a healthier life     Outcomes   Expected Outcomes Demonstrated interest in learning. Expect positive outcomes    Future DMSE 4-6 wks           Individualized Plan for Diabetes Self-Management Training:   Learning Objective:  Patient will have a greater understanding of diabetes self-management. Patient education plan is to attend individual and/or group sessions per assessed needs and concerns.   Plan:   Patient Instructions  Check blood sugars 2 x day before breakfast and 2 hrs after supper every day Bring blood sugar records to the next class  Exercise: Walk  for   10  minutes   3  days a week and gradually increase as tolerated  Eat 3 meals day,  1-2  snacks a day Space meals 4-6 hours apart Allow 2-3 hours between meals and snacks Don't skip meals Avoid sugar sweetened drinks (coffee, juices)  Expected Outcomes:  Demonstrated interest in learning. Expect positive outcomes  Education material provided:  General  Meal Planning Guidelines Simple Meal Plan Symptoms, causes and treatments of Hyperglycemia Symptoms, causes and treatments of Hypoglycemia  If problems or questions, patient to contact team via: Johny Drilling, RN, [place] [phone]  Future DSME appointment: 4-6 wks  February 07, 2021 for Diabetes Class 1

## 2021-01-10 NOTE — Patient Instructions (Signed)
Check blood sugars 2 x day before breakfast and 2 hrs after supper every day Bring blood sugar records to the next class  Exercise: Walk  for   10  minutes   3  days a week and gradually increase as tolerated  Eat 3 meals day,  1-2  snacks a day Space meals 4-6 hours apart Allow 2-3 hours between meals and snacks Don't skip meals Avoid sugar sweetened drinks (coffee, juices)  Return for classes on:

## 2021-01-21 ENCOUNTER — Emergency Department: Payer: Medicare Other

## 2021-01-21 ENCOUNTER — Emergency Department
Admission: EM | Admit: 2021-01-21 | Discharge: 2021-01-21 | Disposition: A | Discharge status: Home / Self Care | Payer: Medicare Other | Attending: Emergency Medicine | Admitting: Emergency Medicine

## 2021-01-21 ENCOUNTER — Other Ambulatory Visit: Payer: Self-pay

## 2021-01-21 ENCOUNTER — Encounter: Payer: Self-pay | Admitting: Emergency Medicine

## 2021-01-21 DIAGNOSIS — R519 Headache, unspecified: Secondary | ICD-10-CM | POA: Diagnosis not present

## 2021-01-21 DIAGNOSIS — Z7982 Long term (current) use of aspirin: Secondary | ICD-10-CM | POA: Insufficient documentation

## 2021-01-21 DIAGNOSIS — J45909 Unspecified asthma, uncomplicated: Secondary | ICD-10-CM | POA: Insufficient documentation

## 2021-01-21 DIAGNOSIS — I10 Essential (primary) hypertension: Secondary | ICD-10-CM | POA: Diagnosis not present

## 2021-01-21 DIAGNOSIS — Z7984 Long term (current) use of oral hypoglycemic drugs: Secondary | ICD-10-CM | POA: Insufficient documentation

## 2021-01-21 DIAGNOSIS — S8991XA Unspecified injury of right lower leg, initial encounter: Secondary | ICD-10-CM | POA: Diagnosis present

## 2021-01-21 DIAGNOSIS — S8391XA Sprain of unspecified site of right knee, initial encounter: Secondary | ICD-10-CM | POA: Diagnosis not present

## 2021-01-21 DIAGNOSIS — Y92481 Parking lot as the place of occurrence of the external cause: Secondary | ICD-10-CM | POA: Insufficient documentation

## 2021-01-21 DIAGNOSIS — W01198A Fall on same level from slipping, tripping and stumbling with subsequent striking against other object, initial encounter: Secondary | ICD-10-CM | POA: Diagnosis not present

## 2021-01-21 DIAGNOSIS — W19XXXA Unspecified fall, initial encounter: Secondary | ICD-10-CM

## 2021-01-21 DIAGNOSIS — Z79899 Other long term (current) drug therapy: Secondary | ICD-10-CM | POA: Diagnosis not present

## 2021-01-21 DIAGNOSIS — S60221A Contusion of right hand, initial encounter: Secondary | ICD-10-CM | POA: Diagnosis not present

## 2021-01-21 DIAGNOSIS — E119 Type 2 diabetes mellitus without complications: Secondary | ICD-10-CM | POA: Diagnosis not present

## 2021-01-21 DIAGNOSIS — D649 Anemia, unspecified: Secondary | ICD-10-CM | POA: Diagnosis not present

## 2021-01-21 DIAGNOSIS — T07XXXA Unspecified multiple injuries, initial encounter: Secondary | ICD-10-CM

## 2021-01-21 LAB — COMPREHENSIVE METABOLIC PANEL
ALT: 23 U/L (ref 0–44)
AST: 31 U/L (ref 15–41)
Albumin: 4 g/dL (ref 3.5–5.0)
Alkaline Phosphatase: 75 U/L (ref 38–126)
Anion gap: 10 (ref 5–15)
BUN: 14 mg/dL (ref 8–23)
CO2: 25 mmol/L (ref 22–32)
Calcium: 9 mg/dL (ref 8.9–10.3)
Chloride: 101 mmol/L (ref 98–111)
Creatinine, Ser: 0.85 mg/dL (ref 0.44–1.00)
GFR, Estimated: >60 mL/min (ref >60)
Glucose, Bld: 390 mg/dL — ABNORMAL HIGH (ref 70–99)
Potassium: 4.2 mmol/L (ref 3.5–5.1)
Sodium: 136 mmol/L (ref 135–145)
Total Bilirubin: 0.7 mg/dL (ref 0.3–1.2)
Total Protein: 6.6 g/dL (ref 6.5–8.1)

## 2021-01-21 LAB — CBC WITH DIFFERENTIAL/PLATELET
Abs Immature Granulocytes: 0.03 10*3/uL (ref 0.00–0.07)
Basophils Absolute: 0 10*3/uL (ref 0.0–0.1)
Basophils Relative: 1 %
Eosinophils Absolute: 0.2 10*3/uL (ref 0.0–0.5)
Eosinophils Relative: 2 %
HCT: 30.3 % — ABNORMAL LOW (ref 36.0–46.0)
Hemoglobin: 9.6 g/dL — ABNORMAL LOW (ref 12.0–15.0)
Immature Granulocytes: 1 %
Lymphocytes Relative: 22 %
Lymphs Abs: 1.3 10*3/uL (ref 0.7–4.0)
MCH: 24.7 pg — ABNORMAL LOW (ref 26.0–34.0)
MCHC: 31.7 g/dL (ref 30.0–36.0)
MCV: 77.9 fL — ABNORMAL LOW (ref 80.0–100.0)
Monocytes Absolute: 0.6 10*3/uL (ref 0.1–1.0)
Monocytes Relative: 9 %
Neutro Abs: 4.1 10*3/uL (ref 1.7–7.7)
Neutrophils Relative %: 65 %
Platelets: 164 10*3/uL (ref 150–400)
RBC: 3.89 MIL/uL (ref 3.87–5.11)
RDW: 15.9 % — ABNORMAL HIGH (ref 11.5–15.5)
WBC: 6.2 10*3/uL (ref 4.0–10.5)
nRBC: 0 % (ref 0.0–0.2)

## 2021-01-21 LAB — CBG MONITORING, ED: Glucose-Capillary: 359 mg/dL — ABNORMAL HIGH (ref 70–99)

## 2021-01-21 MED ORDER — SODIUM CHLORIDE 0.9 % IV BOLUS
1000.0000 mL | Freq: Once | INTRAVENOUS | Status: AC
  Administered 2021-01-21: 1000 mL via INTRAVENOUS

## 2021-01-21 NOTE — ED Provider Notes (Signed)
[facility] Emergency Department Provider Note  ____________________________________________   Event Date/Time   First MD Initiated Contact with Patient 01/21/21 1111     (approximate)  I have reviewed the triage vital signs and the nursing notes.   HISTORY  Chief Complaint Fall    HPI Laura [name] is a 70 y.o. female presents emergency department after a fall [facility]'s parking lot.  Patient states she was going in to see her doctor and fell landing on her knee and hitting the back of her head.  States her right hand hurts a little bit but does not feel like that is broken.  She does have a headache at this time.  Patient is a diabetic and took her medication this morning and has not had anything to eat or drink.  She thought they scanned her head when she arrived here but they did not.    Past Medical History:  Diagnosis Date  . Allergy   . Anxiety   . Asthma   . Chronic back pain   . Chronic cough 07/01/10   PFT  FEV1 2.20 (93%), FEV 1% 81, TLC 4,12 (81%), DLCO 78%, no BD. normal chest CT, sinus 07/08/10  . Cirrhosis, non-alcoholic ([place]) 56/97/9480  . Depression   . Diabetes mellitus type 2, uncomplicated ([place])   . Diabetes mellitus without complication ([place])   . Diabetic neuropathy ([place])   . Diverticulosis   . Dysrhythmia    H/O PALPITATIONS-SINCE ABLATION IN 2013 AT [facility], PALPITATIONS MUCH BETTER  . GERD (gastroesophageal reflux disease)    NO MEDS  . Hemorrhoids   . HTN (hypertension)   . NAFLD (nonalcoholic fatty liver disease) 01/18/2018  . Neuropathy   . Osteoarthritis   . Sleep apnea    NO CPAP    Patient Active Problem List   Diagnosis Date Noted  . Metabolic syndrome 16/55/3748  . Other specified diabetes mellitus with other specified complication ([place]) 27/06/8674  . Fatty liver 06/28/2020  . Complex regional pain syndrome 07/21/2016  . Lumbar radiculopathy 07/21/2016  . DDD (degenerative disc disease), lumbar  07/02/2016  . Facet syndrome, lumbar 07/02/2016  . Allergic state 02/28/2016  . Arthritis 02/28/2016  . Acid reflux 02/28/2016  . BP (high blood pressure) 02/28/2016  . Chronic painful diabetic neuropathy ([place]) 02/05/2016  . Difficulty sleeping 02/05/2016  . Diabetic neuropathy ([place]) 01/31/2016  . Reflux 11/19/2011  . GERD 09/19/2010  . OTHER DISEASES OF TRACHEA AND BRONCHUS 08/14/2010  . FATTY LIVER DISEASE 07/22/2010  . Cough 06/04/2010  . ANXIETY DEPRESSION 05/18/2009  . HYPERTENSION, UNSPECIFIED 05/18/2009  . PALPITATIONS 05/18/2009    Past Surgical History:  Procedure Laterality Date  . BRONCHOSCOPY  07/25/10  . CARDIAC ELECTROPHYSIOLOGY STUDY AND ABLATION    . CARPAL TUNNEL RELEASE    . CATARACT EXTRACTION Bilateral Jan 2017  . cathater ablation     for arrhythmia  . COLONOSCOPY  2014  . COLONOSCOPY WITH PROPOFOL N/A 09/07/2019   Procedure: COLONOSCOPY WITH PROPOFOL;  Surgeon: Toledo, Benay Pike, MD;  Location: [facility] ENDOSCOPY;  Service: Gastroenterology;  Laterality: N/A;  . ESOPHAGOGASTRODUODENOSCOPY (EGD) WITH PROPOFOL N/A 07/05/2018   Procedure: ESOPHAGOGASTRODUODENOSCOPY (EGD) WITH PROPOFOL;  Surgeon: Manya Silvas, MD;  Location: [facility] ENDOSCOPY;  Service: Endoscopy;  Laterality: N/A;  . FOOT SURGERY    . HEMORROIDECTOMY    . LIPOMA EXCISION Right 10/20/2016   Procedure: EXCISION LIPOMA;  Surgeon: Christene Lye, MD;  Location: [facility] ORS;  Service: General;  Laterality: Right;  . PARTIAL HYSTERECTOMY    . TOENAIL EXCISION Right     Prior to Admission medications   Medication Sig Start Date End Date Taking? Authorizing Provider  amLODipine (NORVASC) 5 MG tablet Take 5 mg by mouth daily. 12/26/20   [provider]  Ascorbic Acid (VITAMIN C PO) Take 1 tablet by mouth daily.    [provider]  aspirin EC 81 MG tablet Take 81 mg by mouth daily.    [provider]  Cholecalciferol (VITAMIN D3 PO) Take 1 tablet by mouth daily.     [provider]  clonazePAM (KLONOPIN) 1 MG tablet Take 1 tablet by mouth 2 (two) times daily. 07/02/20   [provider]  fluticasone (FLONASE) 50 MCG/ACT nasal spray Place 2 sprays into the nose daily.    [provider]  gabapentin (NEURONTIN) 300 MG capsule TAKE 2 CAPSULES BY MOUTH 4 TIMES DAILY 01/02/21   Cletis Athens, MD  glipiZIDE (GLUCOTROL XL) 5 MG 24 hr tablet Take 5 mg by mouth 2 (two) times daily.  01/29/16   [provider]  hydrochlorothiazide (HYDRODIURIL) 25 MG tablet Take 25 mg by mouth daily. 11/13/20   [provider]  lisinopril (ZESTRIL) 20 MG tablet Take 20 mg by mouth daily. 11/15/20   [provider]  losartan (COZAAR) 50 MG tablet Take 50 mg by mouth daily. 12/13/20   [provider]  metFORMIN (GLUCOPHAGE) 500 MG tablet Take 500 mg by mouth 2 (two) times daily. 12/15/20   [provider]  metoprolol tartrate (LOPRESSOR) 25 MG tablet Take 25 mg by mouth 2 (two) times daily. 11/20/20   [provider]  pantoprazole (PROTONIX) 40 MG tablet Take 40 mg by mouth daily. 11/25/18   [provider]  propranolol (INDERAL) 10 MG tablet Take 10 mg by mouth in the morning, at noon, and at bedtime.    [provider]  rosuvastatin (CRESTOR) 10 MG tablet Take 10 mg by mouth daily. 11/17/20   [provider]  RYBELSUS 3 MG TABS Take 3 mg by mouth daily. 09/28/20   [provider]  traZODone (DESYREL) 50 MG tablet Take 2 tablets by mouth at bedtime. 01/01/21   [provider]    Allergies Atenolol, Clarithromycin, Doxycycline, Sulfa antibiotics, Erythromycin base, Prednisone, and Penicillins  Family History  Problem Relation Age of Onset  . Cancer Mother        stomach  . Diabetes Mother   . Cancer Father        brain  . Heart disease Father   . Cancer Brother        mouth  . Heart disease Brother   . Ovarian cancer Maternal Grandmother     Social  History Social History   Tobacco Use  . Smoking status: Never Smoker  . Smokeless tobacco: Never Used  Substance Use Topics  . Alcohol use: No    Alcohol/week: 0.0 standard drinks  . Drug use: No    Review of Systems  Constitutional: No fever/chills Eyes: No visual changes. ENT: No sore throat. Respiratory: Denies cough Cardiovascular: Denies chest pain Gastrointestinal: Denies abdominal pain Genitourinary: Negative for dysuria. Musculoskeletal: Negative for back pain.  Positive for right knee and right hand pain Skin: Negative for rash. Psychiatric: no mood changes,     ____________________________________________   PHYSICAL EXAM:  VITAL SIGNS: ED Triage Vitals  Enc Vitals Group     BP 01/21/21 0931 (!) 103/56     Pulse  Rate 01/21/21 0931 61     Resp 01/21/21 0931 18     Temp 01/21/21 0931 98.2 F (36.8 C)     Temp Source 01/21/21 0931 Oral     SpO2 01/21/21 0931 100 %     Weight 01/21/21 0930 178 lb (80.7 kg)     Height --      Head Circumference --      Peak Flow --      Pain Score 01/21/21 0929 7     Pain Loc --      Pain Edu? --      Excl. in [place]? --     Constitutional: Alert and oriented. Well appearing and in no acute distress. Eyes: Conjunctivae are normal.  Head: Atraumatic.  Skull is nontender Nose: No congestion/rhinnorhea. Mouth/Throat: Mucous membranes are moist.   Neck:  supple no lymphadenopathy noted Cardiovascular: Normal rate, regular rhythm. Respiratory: Normal respiratory effort.  No retractions GU: deferred Musculoskeletal: Right knee is swollen and tender, joint line is tender, neurovascular is intact, upper femur, hip and lower leg/ankle are not tender right hand has no bruising or swelling noted, C-spine is nontender  neurologic:  Normal speech and language.  Patient is confused only to further not she had a CT scan.   Skin:  Skin is warm, dry and intact. No rash noted. Psychiatric: Mood and affect are normal. Speech and behavior  are normal.  ____________________________________________   LABS (all labs ordered are listed, but only abnormal results are displayed)  Labs Reviewed  COMPREHENSIVE METABOLIC PANEL - Abnormal; Notable for the following components:      Result Value   Glucose, Bld 390 (*)    All other components within normal limits  CBC WITH DIFFERENTIAL/PLATELET - Abnormal; Notable for the following components:   Hemoglobin 9.6 (*)    HCT 30.3 (*)    MCV 77.9 (*)    MCH 24.7 (*)    RDW 15.9 (*)    All other components within normal limits  CBG MONITORING, ED - Abnormal; Notable for the following components:   Glucose-Capillary 359 (*)    All other components within normal limits   ____________________________________________   ____________________________________________  RADIOLOGY  CT of the head X-ray of the right knee  ____________________________________________   PROCEDURES  Procedure(s) performed: no  Procedures    ____________________________________________   INITIAL IMPRESSION / ASSESSMENT AND PLAN / ED COURSE  Pertinent labs & imaging results that were available during my care of the patient were reviewed by me and considered in my medical decision making (see chart for details).   Patient 70 year old female presents emergency department for fall.  See HPI.  Physical exam shows patient to appear stable.  Skull is nontender, however the patient acts a little confused about whether or not she had a head CT.  I do think this is due to her being laid down on the x-ray table.  This is why she thinks she had a CT.  DDx: Head injury, subdural, subarachnoid hemorrhage, fracture of the right knee, multiple contusions  X-ray of the right knee is negative for fracture  CBG due to the patient taking her medications when she is not had anything to eat or drink is 359 We will try to correct this with normal saline  CBC, metabolic panel ordered  CT of the head  CT of the  head and C-spine are negative.  CBC shows a microcytic anemia which is trended down over the past few months.  Hemoglobin today is 9.6.  I did speak with Dr. Su Hoff about the hemoglobin.  States to have her follow-up with gastroenterology if she is not currently actively bleeding.  I do feel this may explain some of the patient's fatigue.  She is a [facility] patient so she would like to see a GI specialist at [facility].  I did speak with her daughter concerning the lab work and follow-up.  She is also to follow-up with orthopedics if her knee continues to give her problems and 1 to 2 weeks.  The patient has a positive depression associated with the death of her husband and family issues surrounding his death.  I did have a discussion with her about joining a grief counseling group.  She should also talk to Dr. Kary Kos about antidepressants.  She states she understands.  She is not suicidal or homicidal at this time so does not need admission for depression.  She was discharged in stable condition with strict precautions to return if worsening    VARVARA LEGAULT was evaluated in Emergency Department on 01/21/2021 for the symptoms described in the history of present illness. She was evaluated in the context of the global COVID-19 pandemic, which necessitated consideration that the patient might be at risk for infection with the SARS-CoV-2 virus that causes COVID-19. Institutional protocols and algorithms that pertain to the evaluation of patients at risk for COVID-19 are in a state of rapid change based on information released by regulatory bodies including the CDC and federal and state organizations. These policies and algorithms were followed during the patient's care in the ED.    As part of my medical decision making, I reviewed the following data within the [place] History obtained from family, Nursing notes reviewed and incorporated, Labs reviewed , Old chart  reviewed, Radiograph reviewed , Notes from prior ED visits and [place] Controlled Substance Database  ____________________________________________   FINAL CLINICAL IMPRESSION(S) / ED DIAGNOSES  Final diagnoses:  Fall, initial encounter  Low hemoglobin  Sprain of right knee, unspecified ligament, initial encounter  Multiple contusions      NEW MEDICATIONS STARTED DURING THIS VISIT:  Discharge Medication List as of 01/21/2021 12:48 PM       Note:  This document was prepared using Dragon voice recognition software and may include unintentional dictation errors.    Versie Starks, PA-C 01/21/21 1331    Vladimir Crofts, MD 01/21/21 [phone]

## 2021-01-21 NOTE — Progress Notes (Signed)
Inpatient Diabetes Program Recommendations  AACE/ADA: New Consensus Statement on Inpatient Glycemic Control (2015)  Target Ranges:  Prepandial:   less than 140 mg/dL      Peak postprandial:   less than 180 mg/dL (1-2 hours)      Critically ill patients:  140 - 180 mg/dL   Lab Results  Component Value Date   GLUCAP 359 (H) 01/21/2021    Review of Glycemic Control  Diabetes history: DM2 Outpatient Diabetes medications: Glucotrol 5 mg bid + Metformin 500 mg bid Current orders for Inpatient glycemic control: None (currently in ED)  Inpatient Diabetes Program Recommendations:   Noted patient has appointment with Dr. Mee Hives on 01/23/21 for diabetes type 2 consult. Currently in ED and will follow during hospitalization.  Thank you, Nani Gasser. Ova Meegan, RN, MSN, CDE  Diabetes Coordinator Inpatient Glycemic Control Team Team Pager [phone] (8am-5pm) 01/21/2021 1:07 PM

## 2021-01-21 NOTE — Discharge Instructions (Signed)
Follow-up with your regular doctor as needed Call [facility] clinic gastroenterology for an appointment due to your low hemoglobin Return emergency department worsening Apply ice to the knee

## 2021-01-21 NOTE — ED Triage Notes (Signed)
Arrives from [facility] for ED evaluation.  Patient fell in [facility] parking lot this morning.  Fell hitting right knee, right hand and face.  Patient states she fell onto face.  No LOC.

## 2021-01-24 ENCOUNTER — Other Ambulatory Visit: Payer: Self-pay

## 2021-01-24 ENCOUNTER — Other Ambulatory Visit
Admission: RE | Admit: 2021-01-24 | Discharge: 2021-01-24 | Disposition: A | Discharge status: Home / Self Care | Payer: Medicare Other | Source: Ambulatory Visit | Attending: Internal Medicine | Admitting: Internal Medicine

## 2021-01-24 ENCOUNTER — Other Ambulatory Visit: Payer: Self-pay | Admitting: Internal Medicine

## 2021-01-24 DIAGNOSIS — Z20822 Contact with and (suspected) exposure to covid-19: Secondary | ICD-10-CM | POA: Insufficient documentation

## 2021-01-24 DIAGNOSIS — K766 Portal hypertension: Secondary | ICD-10-CM

## 2021-01-24 DIAGNOSIS — K7460 Unspecified cirrhosis of liver: Secondary | ICD-10-CM

## 2021-01-24 DIAGNOSIS — Z01812 Encounter for preprocedural laboratory examination: Secondary | ICD-10-CM | POA: Insufficient documentation

## 2021-01-24 LAB — SARS CORONAVIRUS 2 (TAT 6-24 HRS): SARS Coronavirus 2: NEGATIVE

## 2021-01-28 ENCOUNTER — Ambulatory Visit: Payer: Medicare Other | Admitting: Certified Registered"

## 2021-01-28 ENCOUNTER — Ambulatory Visit
Admission: RE | Admit: 2021-01-28 | Discharge: 2021-01-28 | Disposition: A | Discharge status: Home / Self Care | Payer: Medicare Other | Attending: Internal Medicine | Admitting: Internal Medicine

## 2021-01-28 ENCOUNTER — Encounter: Payer: Self-pay | Admitting: Internal Medicine

## 2021-01-28 ENCOUNTER — Encounter
Admission: RE | Disposition: A | Discharge status: Home / Self Care | Payer: Self-pay | Source: Home / Self Care | Attending: Internal Medicine

## 2021-01-28 ENCOUNTER — Other Ambulatory Visit: Payer: Self-pay

## 2021-01-28 DIAGNOSIS — Z538 Procedure and treatment not carried out for other reasons: Secondary | ICD-10-CM | POA: Diagnosis not present

## 2021-01-28 DIAGNOSIS — Z13810 Encounter for screening for upper gastrointestinal disorder: Secondary | ICD-10-CM | POA: Diagnosis not present

## 2021-01-28 DIAGNOSIS — E1165 Type 2 diabetes mellitus with hyperglycemia: Secondary | ICD-10-CM | POA: Insufficient documentation

## 2021-01-28 LAB — BASIC METABOLIC PANEL
Anion gap: 10 (ref 5–15)
BUN: 20 mg/dL (ref 8–23)
CO2: 25 mmol/L (ref 22–32)
Calcium: 8.8 mg/dL — ABNORMAL LOW (ref 8.9–10.3)
Chloride: 96 mmol/L — ABNORMAL LOW (ref 98–111)
Creatinine, Ser: 0.93 mg/dL (ref 0.44–1.00)
GFR, Estimated: >60 mL/min (ref >60)
Glucose, Bld: 510 mg/dL (ref 70–99)
Potassium: 3.9 mmol/L (ref 3.5–5.1)
Sodium: 131 mmol/L — ABNORMAL LOW (ref 135–145)

## 2021-01-28 LAB — GLUCOSE, CAPILLARY
Glucose-Capillary: 537 mg/dL (ref 70–99)
Glucose-Capillary: 517 mg/dL (ref 70–99)

## 2021-01-28 SURGERY — ESOPHAGOGASTRODUODENOSCOPY (EGD) WITH PROPOFOL
Anesthesia: General

## 2021-01-28 MED ORDER — INSULIN ASPART 100 UNIT/ML ~~LOC~~ SOLN
SUBCUTANEOUS | Status: AC
  Administered 2021-01-28: 10 [IU]
  Filled 2021-01-28: qty 1

## 2021-01-28 MED ORDER — INSULIN ASPART 100 UNIT/ML ~~LOC~~ SOLN: 10.0000 [IU] | Freq: Once | SUBCUTANEOUS | Status: DC

## 2021-01-28 MED ORDER — SODIUM CHLORIDE 0.9 % IV SOLN: INTRAVENOUS | Status: DC

## 2021-01-28 NOTE — Interval H&P Note (Signed)
History and Physical Interval Note:  01/28/2021 3:53 PM  Laura [name]  has presented today for surgery, with the diagnosis of CIRRHOSS.  The various methods of treatment have been discussed with the patient and family. After consideration of risks, benefits and other options for treatment, the patient has consented to  Procedure(s): ESOPHAGOGASTRODUODENOSCOPY (EGD) WITH PROPOFOL (N/A) as a surgical intervention.  The patient's history has been reviewed, patient examined, no change in status, stable for surgery.  I have reviewed the patient's chart and labs.  Questions were answered to the patient's satisfaction.     [place], [place]

## 2021-01-28 NOTE — H&P (Signed)
Outpatient short stay form Pre-procedure 01/28/2021 3:43 PM Teodoro K. Alice Reichert, M.D.  Primary Physician: [place] Pink, M.D.   Reason for visit: Screening for esophageal varices  History of present illness: Patient with hx of uncontrolled DM and NAFLD cirrhosis presents for EGD for variceal surveillance. Patient denies intractable heartburn, dysphagia, hemetemesis, abdominal pain, nausea or vomiting.     Current Facility-Administered Medications:  .  0.9 %  sodium chloride infusion, , Intravenous, Continuous, Toledo, Benay Pike, MD .  insulin aspart (novoLOG) injection 10 Units, 10 Units, Intravenous, Once, Phill Mutter, MD  Medications Prior to Admission  Medication Sig Dispense Refill Last Dose  . amLODipine (NORVASC) 5 MG tablet Take 5 mg by mouth daily.   01/27/2021 at Unknown time  . aspirin EC 81 MG tablet Take 81 mg by mouth daily.   01/27/2021 at Unknown time  . clonazePAM (KLONOPIN) 1 MG tablet Take 1 tablet by mouth 2 (two) times daily.   01/27/2021 at Unknown time  . gabapentin (NEURONTIN) 300 MG capsule TAKE 2 CAPSULES BY MOUTH 4 TIMES DAILY 240 capsule 0 01/27/2021 at Unknown time  . glipiZIDE (GLUCOTROL XL) 5 MG 24 hr tablet Take 5 mg by mouth 2 (two) times daily.    01/28/2021 at Unknown time  . hydrochlorothiazide (HYDRODIURIL) 25 MG tablet Take 25 mg by mouth daily.   01/27/2021 at Unknown time  . lisinopril (ZESTRIL) 20 MG tablet Take 20 mg by mouth daily.   01/27/2021 at Unknown time  . losartan (COZAAR) 50 MG tablet Take 50 mg by mouth daily.   01/27/2021 at Unknown time  . metFORMIN (GLUCOPHAGE) 500 MG tablet Take 500 mg by mouth 2 (two) times daily.   01/28/2021 at Unknown time  . metoprolol tartrate (LOPRESSOR) 25 MG tablet Take 25 mg by mouth 2 (two) times daily.   01/27/2021 at Unknown time  . pantoprazole (PROTONIX) 40 MG tablet Take 40 mg by mouth daily.   01/27/2021 at Unknown time  . propranolol (INDERAL) 10 MG tablet Take 10 mg by mouth in the morning, at noon, and at  bedtime.   01/27/2021 at Unknown time  . rosuvastatin (CRESTOR) 10 MG tablet Take 10 mg by mouth daily.   01/27/2021 at Unknown time  . traZODone (DESYREL) 50 MG tablet Take 2 tablets by mouth at bedtime.   01/27/2021 at Unknown time  . Ascorbic Acid (VITAMIN C PO) Take 1 tablet by mouth daily.     . Cholecalciferol (VITAMIN D3 PO) Take 1 tablet by mouth daily.     . fluticasone (FLONASE) 50 MCG/ACT nasal spray Place 2 sprays into the nose daily.        Allergies  Allergen Reactions  . Atenolol Palpitations  . Clarithromycin Other (See Comments) and Nausea And Vomiting    Makes the tongue raw REACTION: nausea  . Doxycycline Palpitations    Makes her heart beat fast Other reaction(s): GI Upset (intolerance) Loose bowels REACTION: loose stool  . Sulfa Antibiotics Nausea Only  . Erythromycin Base Nausea And Vomiting  . Prednisone Other (See Comments)    Mood swings  . Penicillins Rash    Has patient had a PCN reaction causing immediate rash, facial/tongue/throat swelling, SOB or lightheadedness with hypotension: Yes Has patient had a PCN reaction causing severe rash involving mucus membranes or skin necrosis: No Has patient had a PCN reaction that required hospitalization No Has patient had a PCN reaction occurring within the last 10 years: No If all of the above answers are "NO",  then may proceed with Cephalosporin use.  REACTION: rash      Past Medical History:  Diagnosis Date  . Allergy   . Anxiety   . Asthma   . Chronic back pain   . Chronic cough 07/01/10   PFT  FEV1 2.20 (93%), FEV 1% 81, TLC 4,12 (81%), DLCO 78%, no BD. normal chest CT, sinus 07/08/10  . Cirrhosis, non-alcoholic ([place]) 75/88/3254  . Depression   . Diabetes mellitus type 2, uncomplicated ([place])   . Diabetes mellitus without complication ([place])   . Diabetic neuropathy ([place])   . Diverticulosis   . Dysrhythmia    H/O PALPITATIONS-SINCE ABLATION IN 2013 AT [facility], PALPITATIONS MUCH BETTER  . GERD  (gastroesophageal reflux disease)    NO MEDS  . Hemorrhoids   . HTN (hypertension)   . NAFLD (nonalcoholic fatty liver disease) 01/18/2018  . Neuropathy   . Osteoarthritis   . Sleep apnea    NO CPAP    Review of systems:  Otherwise negative.    Physical Exam  Gen: Alert, oriented. Appears stated age.  HEENT: [place]/AT. PERRLA. Lungs: CTA, no wheezes. CV: RR nl S1, S2. Abd: soft, benign, no masses. BS+ Ext: No edema. Pulses 2+    Planned procedures: EGD is canceled per anesthesia.  Anesthesia service requested hospitalist consult to regulate sugar and possibly admit for observation due to uncontrolled hyperglycemia. Patient after initially agreeing, I called Dr.Vipul Manuella Ghazi who agreed to see the patient. The patient shortly thereafter changed her mind, refused the consult and signed out of the unit against medical advice. She is to call her PCP and/or endocrinologist soon to set up an appointment.  Teodoro K. Alice Reichert, M.D. Gastroenterology 01/28/2021  3:43 PM

## 2021-01-28 NOTE — OR Nursing (Signed)
Patients BS very high at 0ver 550.  10 units insulin given.  BS dropped to 515.  A met panel drawn.

## 2021-01-28 NOTE — OR Nursing (Addendum)
Patient called her grandson and asked him to pick her up.  She talked to Dr Alice Reichert who had arranged a hospitalist consult but patient refused to wait.  Patient signed an AMA paper and left   I talked to her at length re: seeking medical attention immediately concerning her blood sugars.  I also told her to call Dr Alice Reichert as soon as medically stable

## 2021-01-28 NOTE — Progress Notes (Signed)
Inpatient Diabetes Program Recommendations  AACE/ADA: New Consensus Statement on Inpatient Glycemic Control (2015)  Target Ranges:  Prepandial:   less than 140 mg/dL      Peak postprandial:   less than 180 mg/dL (1-2 hours)      Critically ill patients:  140 - 180 mg/dL   Lab Results  Component Value Date   XBOZWR 047 (HH) 01/28/2021    Review of Glycemic Control  Diabetes history: type 2 Outpatient Diabetes medications: Metformin 500 mg BID, Glipizide XL 5 mg BID Current orders for Inpatient glycemic control: none  Inpatient Diabetes Program Recommendations:   Noted by Dr. Manuella Ghazi that patient's blood sugars have been in the 500's range and was scheduled for endoscopy today.   Recommend starting IV insulin drip with these high blood sugars if admitted to hospital in order to get CBGs under control. Will  continue to monitor blood sugars while in hospital.   Harvel Ricks RN BSN CDE Diabetes Coordinator Pager: [phone]  8am-5pm

## 2021-01-29 ENCOUNTER — Telehealth: Payer: Self-pay | Admitting: *Deleted

## 2021-01-29 LAB — GLUCOSE, CAPILLARY: Glucose-Capillary: 556 mg/dL (ref 70–99)

## 2021-01-29 NOTE — Telephone Encounter (Signed)
Received call from patient's fried Narda Bonds. She reports that they will not be able to attend Diabetes classes beginning 02/07/21. She reports that the patient is having internal bleeding. They were not able to do an endo yesterday because her blood sugar was 500. She also reports that patient has MD appt tomorrow. Will cancel upcoming classes and instructed them to call back when they are able to attend. The next series will start 03/07/21.

## 2021-01-31 ENCOUNTER — Telehealth: Payer: Self-pay | Admitting: *Deleted

## 2021-01-31 NOTE — Telephone Encounter (Signed)
Received call from patient that she wants to come next week to Diabetes classes. She reports that she talked with her primary and he wants her to start the classes as soon as possible. Rescheduled for next Thurs March 3.

## 2021-02-07 ENCOUNTER — Encounter: Payer: Medicare Other | Attending: Family Medicine | Admitting: Dietician

## 2021-02-07 ENCOUNTER — Other Ambulatory Visit: Payer: Self-pay

## 2021-02-07 ENCOUNTER — Encounter: Payer: Self-pay | Admitting: Dietician

## 2021-02-07 ENCOUNTER — Ambulatory Visit: Payer: Medicare Other

## 2021-02-07 VITALS — Ht 63.0 in | Wt 173.4 lb

## 2021-02-07 DIAGNOSIS — E1165 Type 2 diabetes mellitus with hyperglycemia: Secondary | ICD-10-CM | POA: Insufficient documentation

## 2021-02-07 NOTE — Progress Notes (Signed)

## 2021-02-12 ENCOUNTER — Ambulatory Visit
Admission: RE | Admit: 2021-02-12 | Discharge: 2021-02-12 | Disposition: A | Discharge status: Home / Self Care | Payer: Medicare Other | Source: Ambulatory Visit | Attending: Internal Medicine | Admitting: Internal Medicine

## 2021-02-12 ENCOUNTER — Other Ambulatory Visit: Payer: Self-pay

## 2021-02-12 DIAGNOSIS — K766 Portal hypertension: Secondary | ICD-10-CM | POA: Insufficient documentation

## 2021-02-12 DIAGNOSIS — K7460 Unspecified cirrhosis of liver: Secondary | ICD-10-CM

## 2021-02-12 MED ORDER — IOHEXOL 300 MG/ML  SOLN
100.0000 mL | Freq: Once | INTRAMUSCULAR | Status: AC | PRN
  Administered 2021-02-12: 100 mL via INTRAVENOUS

## 2021-02-14 ENCOUNTER — Ambulatory Visit: Payer: Medicare Other

## 2021-02-14 ENCOUNTER — Telehealth: Payer: Self-pay | Admitting: *Deleted

## 2021-02-14 NOTE — Telephone Encounter (Signed)
Received call from patient's friend Zebedee Iba and she reports that patient had a procedure on Tues and she was given contrast that made her blood sugar go high. She is not able to come to class today. Called patient and she reports that she is not feeling well today but will return next week for Class 3. Explained that we would reschedule Class 2 next week.

## 2021-02-20 ENCOUNTER — Telehealth: Payer: Self-pay | Admitting: *Deleted

## 2021-02-20 NOTE — Telephone Encounter (Signed)
Received call from pt's friend Laura [name] that patient was not feeling well and would not be able to come to Class tomorrow. I called patient and she reports that she has been in the bed the last week and can't sit for classes. She reports not feeling well after that last test - CT scan. Explained that she could make up her last 2 Diabetes classes later this year as her referral is valid x 1 year. Will discharge and send letter to MD.

## 2021-02-21 ENCOUNTER — Ambulatory Visit: Payer: Medicare Other

## 2021-02-21 ENCOUNTER — Encounter: Payer: Self-pay | Admitting: *Deleted

## 2021-04-27 ENCOUNTER — Encounter: Payer: Self-pay | Admitting: Emergency Medicine

## 2021-04-27 ENCOUNTER — Other Ambulatory Visit: Payer: Self-pay

## 2021-04-27 ENCOUNTER — Inpatient Hospital Stay
Admission: EM | Admit: 2021-04-27 | Discharge: 2021-04-30 | DRG: 637 | Disposition: A | Discharge status: Home Health | Payer: Medicare Other | Attending: Internal Medicine | Admitting: Internal Medicine

## 2021-04-27 ENCOUNTER — Emergency Department: Payer: Medicare Other

## 2021-04-27 DIAGNOSIS — E872 Acidosis: Secondary | ICD-10-CM | POA: Diagnosis present

## 2021-04-27 DIAGNOSIS — I10 Essential (primary) hypertension: Secondary | ICD-10-CM | POA: Diagnosis present

## 2021-04-27 DIAGNOSIS — K7460 Unspecified cirrhosis of liver: Secondary | ICD-10-CM | POA: Diagnosis present

## 2021-04-27 DIAGNOSIS — Z881 Allergy status to other antibiotic agents status: Secondary | ICD-10-CM

## 2021-04-27 DIAGNOSIS — Z8249 Family history of ischemic heart disease and other diseases of the circulatory system: Secondary | ICD-10-CM

## 2021-04-27 DIAGNOSIS — F419 Anxiety disorder, unspecified: Secondary | ICD-10-CM | POA: Diagnosis present

## 2021-04-27 DIAGNOSIS — Z90711 Acquired absence of uterus with remaining cervical stump: Secondary | ICD-10-CM

## 2021-04-27 DIAGNOSIS — F32A Depression, unspecified: Secondary | ICD-10-CM

## 2021-04-27 DIAGNOSIS — Z7982 Long term (current) use of aspirin: Secondary | ICD-10-CM

## 2021-04-27 DIAGNOSIS — E1165 Type 2 diabetes mellitus with hyperglycemia: Principal | ICD-10-CM | POA: Diagnosis present

## 2021-04-27 DIAGNOSIS — Z882 Allergy status to sulfonamides status: Secondary | ICD-10-CM

## 2021-04-27 DIAGNOSIS — E860 Dehydration: Secondary | ICD-10-CM | POA: Diagnosis present

## 2021-04-27 DIAGNOSIS — K219 Gastro-esophageal reflux disease without esophagitis: Secondary | ICD-10-CM | POA: Diagnosis present

## 2021-04-27 DIAGNOSIS — G9341 Metabolic encephalopathy: Secondary | ICD-10-CM | POA: Diagnosis present

## 2021-04-27 DIAGNOSIS — Z7984 Long term (current) use of oral hypoglycemic drugs: Secondary | ICD-10-CM

## 2021-04-27 DIAGNOSIS — Z833 Family history of diabetes mellitus: Secondary | ICD-10-CM

## 2021-04-27 DIAGNOSIS — E785 Hyperlipidemia, unspecified: Secondary | ICD-10-CM | POA: Diagnosis present

## 2021-04-27 DIAGNOSIS — J45909 Unspecified asthma, uncomplicated: Secondary | ICD-10-CM | POA: Diagnosis present

## 2021-04-27 DIAGNOSIS — U071 COVID-19: Secondary | ICD-10-CM | POA: Diagnosis present

## 2021-04-27 DIAGNOSIS — G4730 Sleep apnea, unspecified: Secondary | ICD-10-CM | POA: Diagnosis present

## 2021-04-27 DIAGNOSIS — Z880 Allergy status to penicillin: Secondary | ICD-10-CM

## 2021-04-27 DIAGNOSIS — E119 Type 2 diabetes mellitus without complications: Secondary | ICD-10-CM | POA: Diagnosis present

## 2021-04-27 DIAGNOSIS — Z888 Allergy status to other drugs, medicaments and biological substances status: Secondary | ICD-10-CM | POA: Diagnosis not present

## 2021-04-27 DIAGNOSIS — Z79899 Other long term (current) drug therapy: Secondary | ICD-10-CM

## 2021-04-27 DIAGNOSIS — R739 Hyperglycemia, unspecified: Secondary | ICD-10-CM

## 2021-04-27 DIAGNOSIS — E0841 Diabetes mellitus due to underlying condition with diabetic mononeuropathy: Secondary | ICD-10-CM

## 2021-04-27 LAB — URINALYSIS, COMPLETE (UACMP) WITH MICROSCOPIC
Bilirubin Urine: NEGATIVE
Glucose, UA: >=500 mg/dL — AB
Hgb urine dipstick: NEGATIVE
Ketones, ur: NEGATIVE mg/dL
Nitrite: NEGATIVE
Protein, ur: NEGATIVE mg/dL
Specific Gravity, Urine: 1.024 (ref 1.005–1.030)
pH: 5 (ref 5.0–8.0)

## 2021-04-27 LAB — BASIC METABOLIC PANEL
Anion gap: 11 (ref 5–15)
BUN: 14 mg/dL (ref 8–23)
CO2: 25 mmol/L (ref 22–32)
Calcium: 8.8 mg/dL — ABNORMAL LOW (ref 8.9–10.3)
Chloride: 95 mmol/L — ABNORMAL LOW (ref 98–111)
Creatinine, Ser: 0.91 mg/dL (ref 0.44–1.00)
GFR, Estimated: >60 mL/min (ref >60)
Glucose, Bld: 435 mg/dL — ABNORMAL HIGH (ref 70–99)
Potassium: 4 mmol/L (ref 3.5–5.1)
Sodium: 131 mmol/L — ABNORMAL LOW (ref 135–145)

## 2021-04-27 LAB — LACTIC ACID, PLASMA: Lactic Acid, Venous: 3.2 mmol/L (ref 0.5–1.9)

## 2021-04-27 LAB — HEPATIC FUNCTION PANEL
ALT: 23 U/L (ref 0–44)
AST: 39 U/L (ref 15–41)
Albumin: 4 g/dL (ref 3.5–5.0)
Alkaline Phosphatase: 82 U/L (ref 38–126)
Bilirubin, Direct: 0.1 mg/dL (ref 0.0–0.2)
Indirect Bilirubin: 0.6 mg/dL (ref 0.3–0.9)
Total Bilirubin: 0.7 mg/dL (ref 0.3–1.2)
Total Protein: 6.8 g/dL (ref 6.5–8.1)

## 2021-04-27 LAB — CBG MONITORING, ED
Glucose-Capillary: 242 mg/dL — ABNORMAL HIGH (ref 70–99)
Glucose-Capillary: 443 mg/dL — ABNORMAL HIGH (ref 70–99)

## 2021-04-27 LAB — CBC
HCT: 29.5 % — ABNORMAL LOW (ref 36.0–46.0)
Hemoglobin: 9 g/dL — ABNORMAL LOW (ref 12.0–15.0)
MCH: 21 pg — ABNORMAL LOW (ref 26.0–34.0)
MCHC: 30.5 g/dL (ref 30.0–36.0)
MCV: 68.8 fL — ABNORMAL LOW (ref 80.0–100.0)
Platelets: 192 10*3/uL (ref 150–400)
RBC: 4.29 MIL/uL (ref 3.87–5.11)
RDW: 15.9 % — ABNORMAL HIGH (ref 11.5–15.5)
WBC: 7.5 10*3/uL (ref 4.0–10.5)
nRBC: 0 % (ref 0.0–0.2)

## 2021-04-27 LAB — RESP PANEL BY RT-PCR (FLU A&B, COVID) ARPGX2
Influenza A by PCR: NEGATIVE
Influenza B by PCR: NEGATIVE
SARS Coronavirus 2 by RT PCR: POSITIVE — AB

## 2021-04-27 LAB — D-DIMER, QUANTITATIVE: D-Dimer, Quant: 0.66 ug/mL-FEU — ABNORMAL HIGH (ref 0.00–0.50)

## 2021-04-27 LAB — FIBRINOGEN: Fibrinogen: 329 mg/dL (ref 210–475)

## 2021-04-27 MED ORDER — LISINOPRIL 10 MG PO TABS: 20.0000 mg | ORAL_TABLET | Freq: Every day | ORAL | Status: DC

## 2021-04-27 MED ORDER — SODIUM CHLORIDE 0.9 % IV SOLN: INTRAVENOUS | Status: DC

## 2021-04-27 MED ORDER — METOPROLOL TARTRATE 25 MG PO TABS
25.0000 mg | ORAL_TABLET | Freq: Two times a day (BID) | ORAL | Status: DC
  Administered 2021-04-28 – 2021-04-30 (×6): 25 mg via ORAL
  Filled 2021-04-27 (×6): qty 1

## 2021-04-27 MED ORDER — ASCORBIC ACID 500 MG PO TABS: 250.0000 mg | ORAL_TABLET | Freq: Every day | ORAL | Status: DC

## 2021-04-27 MED ORDER — ROSUVASTATIN CALCIUM 10 MG PO TABS
10.0000 mg | ORAL_TABLET | Freq: Every day | ORAL | Status: DC
  Administered 2021-04-28 – 2021-04-30 (×3): 10 mg via ORAL
  Filled 2021-04-27 (×4): qty 1

## 2021-04-27 MED ORDER — HYDROCHLOROTHIAZIDE 25 MG PO TABS
25.0000 mg | ORAL_TABLET | Freq: Every day | ORAL | Status: DC
  Administered 2021-04-28: 25 mg via ORAL
  Filled 2021-04-27: qty 1

## 2021-04-27 MED ORDER — VITAMIN D 25 MCG (1000 UNIT) PO TABS
1000.0000 [IU] | ORAL_TABLET | Freq: Every day | ORAL | Status: DC
  Administered 2021-04-28 – 2021-04-30 (×3): 1000 [IU] via ORAL
  Filled 2021-04-27 (×3): qty 1

## 2021-04-27 MED ORDER — TRAZODONE HCL 50 MG PO TABS
100.0000 mg | ORAL_TABLET | Freq: Every evening | ORAL | Status: DC | PRN
  Administered 2021-04-30: 100 mg via ORAL
  Filled 2021-04-27: qty 2

## 2021-04-27 MED ORDER — GABAPENTIN 300 MG PO CAPS
600.0000 mg | ORAL_CAPSULE | Freq: Four times a day (QID) | ORAL | Status: DC
  Administered 2021-04-28 – 2021-04-30 (×11): 600 mg via ORAL
  Filled 2021-04-27 (×11): qty 2

## 2021-04-27 MED ORDER — SODIUM CHLORIDE 0.9 % IV SOLN: 200.0000 mg | Freq: Once | INTRAVENOUS | Status: DC

## 2021-04-27 MED ORDER — CLONAZEPAM 1 MG PO TABS
1.0000 mg | ORAL_TABLET | Freq: Two times a day (BID) | ORAL | Status: DC
  Administered 2021-04-28 – 2021-04-30 (×6): 1 mg via ORAL
  Filled 2021-04-27 (×3): qty 1
  Filled 2021-04-27: qty 2
  Filled 2021-04-27 (×2): qty 1

## 2021-04-27 MED ORDER — AMLODIPINE BESYLATE 5 MG PO TABS
5.0000 mg | ORAL_TABLET | Freq: Every day | ORAL | Status: DC
  Administered 2021-04-28 – 2021-04-30 (×3): 5 mg via ORAL
  Filled 2021-04-27 (×3): qty 1

## 2021-04-27 MED ORDER — ASCORBIC ACID 500 MG PO TABS
500.0000 mg | ORAL_TABLET | Freq: Every day | ORAL | Status: DC
  Administered 2021-04-28 – 2021-04-30 (×3): 500 mg via ORAL
  Filled 2021-04-27 (×3): qty 1

## 2021-04-27 MED ORDER — SODIUM CHLORIDE 0.9 % IV BOLUS
1000.0000 mL | Freq: Once | INTRAVENOUS | Status: AC
  Administered 2021-04-27: 1000 mL via INTRAVENOUS

## 2021-04-27 MED ORDER — GUAIFENESIN-DM 100-10 MG/5ML PO SYRP: 10.0000 mL | ORAL_SOLUTION | ORAL | Status: DC | PRN

## 2021-04-27 MED ORDER — LOSARTAN POTASSIUM 50 MG PO TABS
50.0000 mg | ORAL_TABLET | Freq: Every day | ORAL | Status: DC
  Administered 2021-04-28 – 2021-04-30 (×3): 50 mg via ORAL
  Filled 2021-04-27 (×3): qty 1

## 2021-04-27 MED ORDER — ASPIRIN EC 81 MG PO TBEC
81.0000 mg | DELAYED_RELEASE_TABLET | Freq: Every day | ORAL | Status: DC
  Administered 2021-04-28 – 2021-04-30 (×3): 81 mg via ORAL
  Filled 2021-04-27 (×3): qty 1

## 2021-04-27 MED ORDER — GLIPIZIDE ER 5 MG PO TB24
5.0000 mg | ORAL_TABLET | Freq: Two times a day (BID) | ORAL | Status: DC
  Administered 2021-04-28: 5 mg via ORAL
  Filled 2021-04-27: qty 1

## 2021-04-27 MED ORDER — PANTOPRAZOLE SODIUM 40 MG PO TBEC
40.0000 mg | DELAYED_RELEASE_TABLET | Freq: Every day | ORAL | Status: DC
  Administered 2021-04-28 – 2021-04-30 (×3): 40 mg via ORAL
  Filled 2021-04-27 (×3): qty 1

## 2021-04-27 MED ORDER — HYDROCOD POLST-CPM POLST ER 10-8 MG/5ML PO SUER: 5.0000 mL | Freq: Two times a day (BID) | ORAL | Status: DC | PRN

## 2021-04-27 MED ORDER — SODIUM CHLORIDE 0.9 % IV SOLN
100.0000 mg | Freq: Once | INTRAVENOUS | Status: AC
  Administered 2021-04-28: 100 mg via INTRAVENOUS
  Filled 2021-04-27: qty 20

## 2021-04-27 MED ORDER — MAGNESIUM HYDROXIDE 400 MG/5ML PO SUSP
30.0000 mL | Freq: Every day | ORAL | Status: DC | PRN
  Filled 2021-04-27: qty 30

## 2021-04-27 MED ORDER — ENOXAPARIN SODIUM 40 MG/0.4ML IJ SOSY
40.0000 mg | PREFILLED_SYRINGE | INTRAMUSCULAR | Status: DC
  Administered 2021-04-28 – 2021-04-29 (×3): 40 mg via SUBCUTANEOUS
  Filled 2021-04-27 (×3): qty 0.4

## 2021-04-27 MED ORDER — PROPRANOLOL HCL 20 MG PO TABS: 10.0000 mg | ORAL_TABLET | Freq: Three times a day (TID) | ORAL | Status: DC

## 2021-04-27 MED ORDER — FLUTICASONE PROPIONATE 50 MCG/ACT NA SUSP
2.0000 | Freq: Every day | NASAL | Status: DC
  Administered 2021-04-28 – 2021-04-30 (×3): 2 via NASAL
  Filled 2021-04-27: qty 16

## 2021-04-27 MED ORDER — INSULIN ASPART 100 UNIT/ML IJ SOLN
0.0000 [IU] | INTRAMUSCULAR | Status: DC
  Administered 2021-04-28: 21:00:00 7 [IU] via SUBCUTANEOUS
  Administered 2021-04-28: 4 [IU] via SUBCUTANEOUS
  Administered 2021-04-28: 7 [IU] via SUBCUTANEOUS
  Administered 2021-04-28 – 2021-04-29 (×4): 3 [IU] via SUBCUTANEOUS
  Filled 2021-04-27 (×7): qty 1

## 2021-04-27 MED ORDER — SODIUM CHLORIDE 0.9 % IV SOLN: 100.0000 mg | Freq: Every day | INTRAVENOUS | Status: DC

## 2021-04-27 MED ORDER — SODIUM CHLORIDE 0.9 % IV SOLN
100.0000 mg | Freq: Every day | INTRAVENOUS | Status: AC
  Administered 2021-04-29 – 2021-04-30 (×2): 100 mg via INTRAVENOUS
  Filled 2021-04-27 (×2): qty 100

## 2021-04-27 MED ORDER — GUAIFENESIN ER 600 MG PO TB12
600.0000 mg | ORAL_TABLET | Freq: Two times a day (BID) | ORAL | Status: DC
  Administered 2021-04-28 – 2021-04-30 (×6): 600 mg via ORAL
  Filled 2021-04-27 (×6): qty 1

## 2021-04-27 MED ORDER — VITAMIN D3 25 MCG PO TABS: 1000.0000 [IU] | ORAL_TABLET | Freq: Every day | ORAL | Status: DC

## 2021-04-27 MED ORDER — ZINC SULFATE 220 (50 ZN) MG PO CAPS
220.0000 mg | ORAL_CAPSULE | Freq: Every day | ORAL | Status: DC
  Administered 2021-04-28 – 2021-04-30 (×3): 220 mg via ORAL
  Filled 2021-04-27 (×3): qty 1

## 2021-04-27 NOTE — H&P (Signed)
[place]   PATIENT NAME: Laura [name]    MR#:  [id number]  DATE OF BIRTH:  1951/06/09  DATE OF ADMISSION:  04/27/2021  PRIMARY CARE PHYSICIAN: Maryland Pink, MD   Patient is coming from: Home.  REQUESTING/REFERRING PHYSICIAN: Marjean Keziyah, MD  CHIEF COMPLAINT:   Chief Complaint  Patient presents with  . Hyperglycemia    HISTORY OF PRESENT ILLNESS:  Laura [name] is a 70 y.o. Caucasian female with medical history significant for asthma, anxiety, type 2 diabetes mellitus, hypertension, sleep apnea, liver cirrhosis, who presented to the emergency room with acute onset of cough and generalized weakness over the last 6 days without significant expectoration as well as elevated blood glucose levels..  She has been having altered mental status with confusion over the last couple of days.  She had an accidental mechanical fall 3 days ago.  She admitted to vomiting this afternoon but denies any diarrhea.  No loss of taste or smell.  No significant dyspnea or wheezing.  No reported fever or chills.  No rhinorrhea or nasal congestion or sore throat or earache.  She has not been having much appetite today.  ED Course: Upon presentation to the ER blood pressure was 131/91 with otherwise normal vital signs.  Labs revealed hyperglycemia of 435 and CBC showed anemia close to baseline.  Lactic acid was 3.2 and blood gas showed pH 7.43 with bicarbonate of 27.2. Urinalysis showed more than 500 glucose with trace leukocytes rare bacteria and 6-10 WBCs.  COVID-19 PCR came back positive with negative influenza antigens.  EKG as reviewed by me : Showed normal sinus rhythm with a rate of 84 with premature atrial complexes Imaging: Noncontrasted head CT scan revealed mild generalized cerebral atrophy with no acute intracranial abnormality.  CT showed mild multilevel degenerative changes with no acute C-spine fracture or subluxation.  The patient was given 2 L bolus of IV normal saline.  She will be  admitted to a medical monitored bed for further evaluation and management. PAST MEDICAL HISTORY:   Past Medical History:  Diagnosis Date  . Allergy   . Anxiety   . Asthma   . Chronic back pain   . Chronic cough 07/01/10   PFT  FEV1 2.20 (93%), FEV 1% 81, TLC 4,12 (81%), DLCO 78%, no BD. normal chest CT, sinus 07/08/10  . Cirrhosis, non-alcoholic ([place]) 37/90/2409  . Depression   . Diabetes mellitus type 2, uncomplicated ([place])   . Diabetes mellitus without complication ([place])   . Diabetic neuropathy ([place])   . Diverticulosis   . Dysrhythmia    H/O PALPITATIONS-SINCE ABLATION IN 2013 AT [facility], PALPITATIONS MUCH BETTER  . GERD (gastroesophageal reflux disease)    NO MEDS  . Hemorrhoids   . HTN (hypertension)   . NAFLD (nonalcoholic fatty liver disease) 01/18/2018  . Neuropathy   . Osteoarthritis   . Sleep apnea    NO CPAP    PAST SURGICAL HISTORY:   Past Surgical History:  Procedure Laterality Date  . BRONCHOSCOPY  07/25/10  . CARDIAC ELECTROPHYSIOLOGY STUDY AND ABLATION    . CARPAL TUNNEL RELEASE    . CATARACT EXTRACTION Bilateral Jamichael Knotts 2017  . cathater ablation     for arrhythmia  . COLONOSCOPY  2014  . COLONOSCOPY WITH PROPOFOL N/A 09/07/2019   Procedure: COLONOSCOPY WITH PROPOFOL;  Surgeon: Toledo, Benay Pike, MD;  Location: [facility] ENDOSCOPY;  Service: Gastroenterology;  Laterality: N/A;  . ESOPHAGOGASTRODUODENOSCOPY (EGD) WITH PROPOFOL N/A 07/05/2018  Procedure: ESOPHAGOGASTRODUODENOSCOPY (EGD) WITH PROPOFOL;  Surgeon: Manya Silvas, MD;  Location: [facility] ENDOSCOPY;  Service: Endoscopy;  Laterality: N/A;  . FOOT SURGERY    . HEMORROIDECTOMY    . LIPOMA EXCISION Right 10/20/2016   Procedure: EXCISION LIPOMA;  Surgeon: Christene Lye, MD;  Location: [facility] ORS;  Service: General;  Laterality: Right;  . PARTIAL HYSTERECTOMY    . TOENAIL EXCISION Right     SOCIAL HISTORY:   Social History   Tobacco Use  . Smoking status: Never Smoker  . Smokeless tobacco: Never  Used  Substance Use Topics  . Alcohol use: No    Alcohol/week: 0.0 standard drinks    FAMILY HISTORY:   Family History  Problem Relation Age of Onset  . Cancer Mother        stomach  . Diabetes Mother   . Cancer Father        brain  . Heart disease Father   . Cancer Brother        mouth  . Heart disease Brother   . Ovarian cancer Maternal Grandmother     DRUG ALLERGIES:   Allergies  Allergen Reactions  . Atenolol Palpitations  . Clarithromycin Other (See Comments) and Nausea And Vomiting    Makes the tongue raw REACTION: nausea  . Doxycycline Palpitations    Makes her heart beat fast Other reaction(s): GI Upset (intolerance) Loose bowels REACTION: loose stool  . Sulfa Antibiotics Nausea Only  . Erythromycin Base Nausea And Vomiting  . Prednisone Other (See Comments)    Mood swings  . Penicillins Rash    Has patient had a PCN reaction causing immediate rash, facial/tongue/throat swelling, SOB or lightheadedness with hypotension: Yes Has patient had a PCN reaction causing severe rash involving mucus membranes or skin necrosis: No Has patient had a PCN reaction that required hospitalization No Has patient had a PCN reaction occurring within the last 10 years: No If all of the above answers are "NO", then may proceed with Cephalosporin use.  REACTION: rash     REVIEW OF SYSTEMS:   ROS As per history of present illness. All pertinent systems were reviewed above. Constitutional, HEENT, cardiovascular, respiratory, GI, GU, musculoskeletal, neuro, psychiatric, endocrine, integumentary and hematologic systems were reviewed and are otherwise negative/unremarkable except for positive findings mentioned above in the HPI.   MEDICATIONS AT HOME:   Prior to Admission medications   Medication Sig Start Date End Date Taking? Authorizing Provider  amLODipine (NORVASC) 5 MG tablet Take 5 mg by mouth daily. 12/26/20   [provider]  Ascorbic Acid (VITAMIN C PO) Take  1 tablet by mouth daily.    [provider]  aspirin EC 81 MG tablet Take 81 mg by mouth daily.    [provider]  Cholecalciferol (VITAMIN D3 PO) Take 1 tablet by mouth daily.    [provider]  clonazePAM (KLONOPIN) 1 MG tablet Take 1 tablet by mouth 2 (two) times daily. 07/02/20   [provider]  fluticasone (FLONASE) 50 MCG/ACT nasal spray Place 2 sprays into the nose daily.    [provider]  gabapentin (NEURONTIN) 300 MG capsule TAKE 2 CAPSULES BY MOUTH 4 TIMES DAILY 01/02/21   Cletis Athens, MD  glipiZIDE (GLUCOTROL XL) 5 MG 24 hr tablet Take 5 mg by mouth 2 (two) times daily.  01/29/16   [provider]  hydrochlorothiazide (HYDRODIURIL) 25 MG tablet Take 25 mg by mouth daily. 11/13/20   [provider]  lisinopril (ZESTRIL)  20 MG tablet Take 20 mg by mouth daily. 11/15/20   [provider]  losartan (COZAAR) 50 MG tablet Take 50 mg by mouth daily. 12/13/20   [provider]  metFORMIN (GLUCOPHAGE) 500 MG tablet Take 500 mg by mouth 2 (two) times daily. 12/15/20   [provider]  metoprolol tartrate (LOPRESSOR) 25 MG tablet Take 25 mg by mouth 2 (two) times daily. 11/20/20   [provider]  pantoprazole (PROTONIX) 40 MG tablet Take 40 mg by mouth daily. 11/25/18   [provider]  propranolol (INDERAL) 10 MG tablet Take 10 mg by mouth in the morning, at noon, and at bedtime.    [provider]  rosuvastatin (CRESTOR) 10 MG tablet Take 10 mg by mouth daily. 11/17/20   [provider]  traZODone (DESYREL) 50 MG tablet Take 2 tablets by mouth at bedtime. 01/01/21   [provider]      VITAL SIGNS:  Blood pressure (!) 107/52, pulse 74, temperature 98.6 F (37 C), temperature source Oral, resp. rate 15, height 5\' 3"  (1.6 m), weight 81.6 kg, SpO2 98 %.  PHYSICAL EXAMINATION:  Physical Exam  GENERAL:  70 y.o.-year-old Caucasian female patient lying in the bed  with no acute distress.  EYES: Pupils equal, round, reactive to light and accommodation. No scleral icterus. Extraocular muscles intact.  HEENT: Head atraumatic, normocephalic. Oropharynx and nasopharynx clear.  NECK:  Supple, no jugular venous distention. No thyroid enlargement, no tenderness.  LUNGS: Normal breath sounds bilaterally, no wheezing, rales,rhonchi or crepitation. No use of accessory muscles of respiration.  CARDIOVASCULAR: Regular rate and rhythm, S1, S2 normal. No murmurs, rubs, or gallops.  ABDOMEN: Soft, nondistended, nontender. Bowel sounds present. No organomegaly or mass.  EXTREMITIES: No pedal edema, cyanosis, or clubbing.  NEUROLOGIC: Cranial nerves II through XII are intact. Muscle strength 5/5 in all extremities. Sensation intact. Gait not checked.  PSYCHIATRIC: The patient is alert and oriented x 3.  Normal affect and good eye contact. SKIN: No obvious rash, lesion, or ulcer.   LABORATORY PANEL:   CBC Recent Labs  Lab 04/27/21 1856  WBC 7.5  HGB 9.0*  HCT 29.5*  PLT 192   ------------------------------------------------------------------------------------------------------------------  Chemistries  Recent Labs  Lab 04/27/21 1856 04/27/21 2055  NA 131*  --   K 4.0  --   CL 95*  --   CO2 25  --   GLUCOSE 435*  --   BUN 14  --   CREATININE 0.91  --   CALCIUM 8.8*  --   AST  --  39  ALT  --  23  ALKPHOS  --  82  BILITOT  --  0.7   ------------------------------------------------------------------------------------------------------------------  Cardiac Enzymes No results for input(s): TROPONINI in the last 168 hours. ------------------------------------------------------------------------------------------------------------------  RADIOLOGY:  DG Chest 2 View  Result Date: 04/27/2021 CLINICAL DATA:  Nonproductive cough. EXAM: CHEST - 2 VIEW COMPARISON:  July 26, 2019 FINDINGS: Chronic appearing increased interstitial lung markings are seen.  There is no evidence of acute infiltrate, pleural effusion or pneumothorax. The heart size and mediastinal contours are within normal limits. Degenerative changes seen throughout the thoracic spine. IMPRESSION: Stable exam without active cardiopulmonary disease. Electronically Signed   By: Virgina Norfolk M.D.   On: 04/27/2021 19:55   CT Head Wo Contrast  Result Date: 04/27/2021 CLINICAL DATA:  Hypoglycemia with dry nonproductive cough. EXAM: CT HEAD WITHOUT CONTRAST TECHNIQUE: Contiguous axial images were obtained from the base of the skull through the vertex  without intravenous contrast. COMPARISON:  January 21, 2021 FINDINGS: Brain: There is mild cerebral atrophy with widening of the extra-axial spaces and ventricular dilatation. There are areas of decreased attenuation within the white matter tracts of the supratentorial brain, consistent with microvascular disease changes. Vascular: No hyperdense vessel or unexpected calcification. Skull: Normal. Negative for fracture or focal lesion. Sinuses/Orbits: Very mild right maxillary sinus and bilateral ethmoid sinus mucosal thickening is seen. Other: None. IMPRESSION: 1. Mild generalized cerebral atrophy. 2. No acute intracranial abnormality. Electronically Signed   By: Virgina Norfolk M.D.   On: 04/27/2021 19:48   CT Cervical Spine Wo Contrast  Result Date: 04/27/2021 CLINICAL DATA:  "Being sick since December." EXAM: CT CERVICAL SPINE WITHOUT CONTRAST TECHNIQUE: Multidetector CT imaging of the cervical spine was performed without intravenous contrast. Multiplanar CT image reconstructions were also generated. COMPARISON:  January 21, 2021 FINDINGS: Alignment: Normal. Skull base and vertebrae: No acute fracture. No primary bone lesion or focal pathologic process. Soft tissues and spinal canal: No prevertebral fluid or swelling. No visible canal hematoma. Disc levels: Mild endplate sclerosis is seen at the levels of C2-C3, C4-C5 and C5-C6. Mild  intervertebral disc space narrowing is noted at the level of C5-C6 and C6-C7. Mild bilateral multilevel facet joint hypertrophy is noted. Upper chest: Negative. Other: None. IMPRESSION: 1. No acute cervical spine fracture or subluxation. 2. Mild multilevel degenerative changes. Electronically Signed   By: Virgina Norfolk M.D.   On: 04/27/2021 19:50      IMPRESSION AND PLAN:  Active Problems:   Uncontrolled type 2 diabetes mellitus with hyperglycemia ([place])  1.  Uncontrolled type 2 diabetes mellitus with hyperglycemia. The patient will be admitted to a medical monitored bed. - We will place the patient on frequent fingerstick blood glucose measures with resistant subcutaneous supplemental coverage with NovoLog. -We will continue Glucotrol XL and hold off metformin.  2.  COVID-19 infection. - The patient will be placed on IV remdesivir. - The patient will be placed on vitamin C, vitamin D3 and zinc sulfate as well as aspirin. - We will follow her inflammatory markers. -Her pulse oximetry is within normal and therefore no steroids will be given at this time.  3.  Essential hypertension. - We will continue her antihypertensives.  4.  Anxiety and depression. - We will continue Klonopin and Effexor XR.  5.  Dyslipidemia. - We will continue statin therapy.  6.  GERD. - We will continue PPI therapy.  DVT prophylaxis: Lovenox. Code Status: full code.  Family Communication:  The plan of care was discussed in details with the patient (and family). I answered all questions. The patient agreed to proceed with the above mentioned plan. Further management will depend upon hospital course. Disposition Plan: Back to previous home environment Consults called: none. All the records are reviewed and case discussed with ED provider.  Status is: Inpatient  Remains inpatient appropriate because:Ongoing diagnostic testing needed not appropriate for outpatient work up, Unsafe d/c plan, IV  treatments appropriate due to intensity of illness or inability to take PO and Inpatient level of care appropriate due to severity of illness   Dispo: The patient is from: Home              Anticipated d/c is to: Home              Patient currently is not medically stable to d/c.   Difficult to place patient No  TOTAL TIME TAKING CARE OF THIS PATIENT: 55 minutes.  Christel Mormon M.D on 04/27/2021 at 11:08 PM  [facility]   From 7 PM-7 AM, contact night-coverage [email or web address]  CC: Primary care physician; [place] Pink, MD

## 2021-04-27 NOTE — ED Triage Notes (Signed)
Pt presents via POV with c/o "being sick since December". Pt currently has dry non-productive cough. Pt also reports blood sugars have been around 500 at home. Pt appears lethargic, but answering questions appropriately.

## 2021-04-27 NOTE — ED Notes (Signed)
ED Provider at bedside. 

## 2021-04-27 NOTE — ED Provider Notes (Signed)
[facility] Emergency Department Provider Note  ____________________________________________   Event Date/Time   First MD Initiated Contact with Patient 04/27/21 1903     (approximate)  I have reviewed the triage vital signs and the nursing notes.   HISTORY  Chief Complaint Hyperglycemia    HPI Laura [name] is a 70 y.o. female with diabetes lives alone who comes in with elevated sugars. According to daughter pt has not been feeling well 5-6 days. She has had cough and worsening weakness and confusion. She did have a fall a few days ago as well. Confusion worse over past 2 days. They noted her sugars were really high. Not on insulin compliant with her home oral meds.  Nothing has made her better or worse.           Past Medical History:  Diagnosis Date  . Allergy   . Anxiety   . Asthma   . Chronic back pain   . Chronic cough 07/01/10   PFT  FEV1 2.20 (93%), FEV 1% 81, TLC 4,12 (81%), DLCO 78%, no BD. normal chest CT, sinus 07/08/10  . Cirrhosis, non-alcoholic ([place]) 08/65/7846  . Depression   . Diabetes mellitus type 2, uncomplicated ([place])   . Diabetes mellitus without complication ([place])   . Diabetic neuropathy ([place])   . Diverticulosis   . Dysrhythmia    H/O PALPITATIONS-SINCE ABLATION IN 2013 AT [facility], PALPITATIONS MUCH BETTER  . GERD (gastroesophageal reflux disease)    NO MEDS  . Hemorrhoids   . HTN (hypertension)   . NAFLD (nonalcoholic fatty liver disease) 01/18/2018  . Neuropathy   . Osteoarthritis   . Sleep apnea    NO CPAP    Patient Active Problem List   Diagnosis Date Noted  . Metabolic syndrome 96/29/5284  . Other specified diabetes mellitus with other specified complication ([place]) 13/24/4010  . Fatty liver 06/28/2020  . Complex regional pain syndrome 07/21/2016  . Lumbar radiculopathy 07/21/2016  . DDD (degenerative disc disease), lumbar 07/02/2016  . Facet syndrome, lumbar 07/02/2016  . Allergic state 02/28/2016  .  Arthritis 02/28/2016  . Acid reflux 02/28/2016  . BP (high blood pressure) 02/28/2016  . Chronic painful diabetic neuropathy ([place]) 02/05/2016  . Difficulty sleeping 02/05/2016  . Diabetic neuropathy ([place]) 01/31/2016  . Reflux 11/19/2011  . GERD 09/19/2010  . OTHER DISEASES OF TRACHEA AND BRONCHUS 08/14/2010  . FATTY LIVER DISEASE 07/22/2010  . Cough 06/04/2010  . ANXIETY DEPRESSION 05/18/2009  . HYPERTENSION, UNSPECIFIED 05/18/2009  . PALPITATIONS 05/18/2009    Past Surgical History:  Procedure Laterality Date  . BRONCHOSCOPY  07/25/10  . CARDIAC ELECTROPHYSIOLOGY STUDY AND ABLATION    . CARPAL TUNNEL RELEASE    . CATARACT EXTRACTION Bilateral Jan 2017  . cathater ablation     for arrhythmia  . COLONOSCOPY  2014  . COLONOSCOPY WITH PROPOFOL N/A 09/07/2019   Procedure: COLONOSCOPY WITH PROPOFOL;  Surgeon: Toledo, Benay Pike, MD;  Location: [facility] ENDOSCOPY;  Service: Gastroenterology;  Laterality: N/A;  . ESOPHAGOGASTRODUODENOSCOPY (EGD) WITH PROPOFOL N/A 07/05/2018   Procedure: ESOPHAGOGASTRODUODENOSCOPY (EGD) WITH PROPOFOL;  Surgeon: Manya Silvas, MD;  Location: [facility] ENDOSCOPY;  Service: Endoscopy;  Laterality: N/A;  . FOOT SURGERY    . HEMORROIDECTOMY    . LIPOMA EXCISION Right 10/20/2016   Procedure: EXCISION LIPOMA;  Surgeon: Christene Lye, MD;  Location: [facility] ORS;  Service: General;  Laterality: Right;  . PARTIAL HYSTERECTOMY    . TOENAIL EXCISION Right  Prior to Admission medications   Medication Sig Start Date End Date Taking? Authorizing Provider  amLODipine (NORVASC) 5 MG tablet Take 5 mg by mouth daily. 12/26/20   [provider]  Ascorbic Acid (VITAMIN C PO) Take 1 tablet by mouth daily.    [provider]  aspirin EC 81 MG tablet Take 81 mg by mouth daily.    [provider]  Cholecalciferol (VITAMIN D3 PO) Take 1 tablet by mouth daily.    [provider]  clonazePAM (KLONOPIN) 1 MG tablet Take 1 tablet by mouth 2  (two) times daily. 07/02/20   [provider]  fluticasone (FLONASE) 50 MCG/ACT nasal spray Place 2 sprays into the nose daily.    [provider]  gabapentin (NEURONTIN) 300 MG capsule TAKE 2 CAPSULES BY MOUTH 4 TIMES DAILY 01/02/21   Cletis Athens, MD  glipiZIDE (GLUCOTROL XL) 5 MG 24 hr tablet Take 5 mg by mouth 2 (two) times daily.  01/29/16   [provider]  hydrochlorothiazide (HYDRODIURIL) 25 MG tablet Take 25 mg by mouth daily. 11/13/20   [provider]  lisinopril (ZESTRIL) 20 MG tablet Take 20 mg by mouth daily. 11/15/20   [provider]  losartan (COZAAR) 50 MG tablet Take 50 mg by mouth daily. 12/13/20   [provider]  metFORMIN (GLUCOPHAGE) 500 MG tablet Take 500 mg by mouth 2 (two) times daily. 12/15/20   [provider]  metoprolol tartrate (LOPRESSOR) 25 MG tablet Take 25 mg by mouth 2 (two) times daily. 11/20/20   [provider]  pantoprazole (PROTONIX) 40 MG tablet Take 40 mg by mouth daily. 11/25/18   [provider]  propranolol (INDERAL) 10 MG tablet Take 10 mg by mouth in the morning, at noon, and at bedtime.    [provider]  rosuvastatin (CRESTOR) 10 MG tablet Take 10 mg by mouth daily. 11/17/20   [provider]  traZODone (DESYREL) 50 MG tablet Take 2 tablets by mouth at bedtime. 01/01/21   [provider]    Allergies Atenolol, Clarithromycin, Doxycycline, Sulfa antibiotics, Erythromycin base, Prednisone, and Penicillins  Family History  Problem Relation Age of Onset  . Cancer Mother        stomach  . Diabetes Mother   . Cancer Father        brain  . Heart disease Father   . Cancer Brother        mouth  . Heart disease Brother   . Ovarian cancer Maternal Grandmother     Social History Social History   Tobacco Use  . Smoking status: Never Smoker  . Smokeless tobacco: Never Used  Vaping Use  . Vaping Use: Never used  Substance Use Topics  .  Alcohol use: No    Alcohol/week: 0.0 standard drinks  . Drug use: No      Review of Systems Constitutional: No fever/chills weakness, high sugar, fall Eyes: No visual changes. ENT: No sore throat. congestion Cardiovascular: Denies chest pain. Respiratory: Denies shortness of breath. cough Gastrointestinal: No abdominal pain.  No nausea, no vomiting.  No diarrhea.  No constipation. Genitourinary: Negative for dysuria. Musculoskeletal: Negative for back pain. Skin: Negative for rash. Neurological: Negative for headaches, focal weakness or numbness. All other ROS negative ____________________________________________   PHYSICAL EXAM:  VITAL SIGNS: ED Triage Vitals  Enc Vitals Group     BP 04/27/21 1845 (!) 131/91     Pulse Rate 04/27/21 1845 81     Resp 04/27/21  1845 18     Temp 04/27/21 1845 98.2 F (36.8 C)     Temp Source 04/27/21 1845 Oral     SpO2 04/27/21 1845 95 %     Weight 04/27/21 1846 180 lb (81.6 kg)     Height 04/27/21 1846 5\' 3"  (1.6 m)     Head Circumference --      Peak Flow --      Pain Score 04/27/21 1846 5     Pain Loc --      Pain Edu? --      Excl. in [place]? --     Constitutional: Alert . Well appearing and in no acute distress. Eyes: Conjunctivae are normal. EOMI. Head: Atraumatic. Nose: No congestion/rhinnorhea. Mouth/Throat: Mucous membranes are moist.   Neck: No stridor. Trachea Midline. FROM Cardiovascular: Normal rate, regular rhythm. Grossly normal heart sounds.  Good peripheral circulation. Respiratory: Normal respiratory effort.  No retractions. Lungs CTAB. Gastrointestinal: Soft and nontender. No distention. No abdominal bruits.  Musculoskeletal: No lower extremity tenderness nor edema.  No joint effusions. Neurologic:  Normal speech and language. No gross focal neurologic deficits are appreciated. Equal strength in arms and legs.  Skin:  Skin is warm, dry and intact. No rash noted. Psychiatric: Mood and affect are normal. Speech and  behavior are normal.  GU: Deferred   ____________________________________________   LABS (all labs ordered are listed, but only abnormal results are displayed)  Labs Reviewed  CBC - Abnormal; Notable for the following components:      Result Value   Hemoglobin 9.0 (*)    HCT 29.5 (*)    MCV 68.8 (*)    MCH 21.0 (*)    RDW 15.9 (*)    All other components within normal limits  CBG MONITORING, ED - Abnormal; Notable for the following components:   Glucose-Capillary 443 (*)    All other components within normal limits  BASIC METABOLIC PANEL  URINALYSIS, COMPLETE (UACMP) WITH MICROSCOPIC  BLOOD GAS, VENOUS  CBG MONITORING, ED  CBG MONITORING, ED   ____________________________________________   ED ECG REPORT I, Vanessa Bernice, the attending physician, personally viewed and interpreted this ECG.  Normal sinus no st elevation, no twi normal intervals ____________________________________________  RADIOLOGY Robert Bellow, personally viewed and evaluated these images (plain radiographs) as part of my medical decision making, as well as reviewing the written report by the radiologist.  ED MD interpretation:  No pna  Official radiology report(s): DG Chest 2 View  Result Date: 04/27/2021 CLINICAL DATA:  Nonproductive cough. EXAM: CHEST - 2 VIEW COMPARISON:  July 26, 2019 FINDINGS: Chronic appearing increased interstitial lung markings are seen. There is no evidence of acute infiltrate, pleural effusion or pneumothorax. The heart size and mediastinal contours are within normal limits. Degenerative changes seen throughout the thoracic spine. IMPRESSION: Stable exam without active cardiopulmonary disease. Electronically Signed   By: Virgina Norfolk M.D.   On: 04/27/2021 19:55   CT Head Wo Contrast  Result Date: 04/27/2021 CLINICAL DATA:  Hypoglycemia with dry nonproductive cough. EXAM: CT HEAD WITHOUT CONTRAST TECHNIQUE: Contiguous axial images were obtained from the base of the  skull through the vertex without intravenous contrast. COMPARISON:  January 21, 2021 FINDINGS: Brain: There is mild cerebral atrophy with widening of the extra-axial spaces and ventricular dilatation. There are areas of decreased attenuation within the white matter tracts of the supratentorial brain, consistent with microvascular disease changes. Vascular: No hyperdense vessel or unexpected calcification. Skull: Normal. Negative for fracture or  focal lesion. Sinuses/Orbits: Very mild right maxillary sinus and bilateral ethmoid sinus mucosal thickening is seen. Other: None. IMPRESSION: 1. Mild generalized cerebral atrophy. 2. No acute intracranial abnormality. Electronically Signed   By: Virgina Norfolk M.D.   On: 04/27/2021 19:48   CT Cervical Spine Wo Contrast  Result Date: 04/27/2021 CLINICAL DATA:  "Being sick since December." EXAM: CT CERVICAL SPINE WITHOUT CONTRAST TECHNIQUE: Multidetector CT imaging of the cervical spine was performed without intravenous contrast. Multiplanar CT image reconstructions were also generated. COMPARISON:  January 21, 2021 FINDINGS: Alignment: Normal. Skull base and vertebrae: No acute fracture. No primary bone lesion or focal pathologic process. Soft tissues and spinal canal: No prevertebral fluid or swelling. No visible canal hematoma. Disc levels: Mild endplate sclerosis is seen at the levels of C2-C3, C4-C5 and C5-C6. Mild intervertebral disc space narrowing is noted at the level of C5-C6 and C6-C7. Mild bilateral multilevel facet joint hypertrophy is noted. Upper chest: Negative. Other: None. IMPRESSION: 1. No acute cervical spine fracture or subluxation. 2. Mild multilevel degenerative changes. Electronically Signed   By: Virgina Norfolk M.D.   On: 04/27/2021 19:50    ____________________________________________   PROCEDURES  Procedure(s) performed (including Critical Care):  .1-3 Lead EKG Interpretation Performed by: Vanessa Plainedge, MD Authorized by:  Vanessa Concho, MD     Interpretation: normal     ECG rate:  80s   ECG rate assessment: normal     Rhythm: sinus rhythm     Ectopy: none     Conduction: normal       ____________________________________________   INITIAL IMPRESSION / ASSESSMENT AND PLAN / ED COURSE  Laura [name] was evaluated in Emergency Department on 04/27/2021 for the symptoms described in the history of present illness. She was evaluated in the context of the global COVID-19 pandemic, which necessitated consideration that the patient might be at risk for infection with the SARS-CoV-2 virus that causes COVID-19. Institutional protocols and algorithms that pertain to the evaluation of patients at risk for COVID-19 are in a state of rapid change based on information released by regulatory bodies including the CDC and federal and state organizations. These policies and algorithms were followed during the patient's care in the ED.    Pt comes in with 5-6 days of symptoms. Differential aki, electrolyte abb, UTI, PNA, COVID, DKA.unlikely stroke reassuring exam.  Will give fluids and keep on cardiac monitor.  Labs show sugar in 400s but no DKA. COVID +  Given pt weakness, confusion, lives alone will d/w hospital for admission.       ____________________________________________   FINAL CLINICAL IMPRESSION(S) / ED DIAGNOSES   Final diagnoses:  Hyperglycemia  COVID-19      MEDICATIONS GIVEN DURING THIS VISIT:  Medications  aspirin EC tablet 81 mg (81 mg Oral Given 04/28/21 0857)  amLODipine (NORVASC) tablet 5 mg (5 mg Oral Given 04/28/21 0857)  losartan (COZAAR) tablet 50 mg (50 mg Oral Given 04/28/21 0856)  metoprolol tartrate (LOPRESSOR) tablet 25 mg (25 mg Oral Given 04/28/21 0859)  rosuvastatin (CRESTOR) tablet 10 mg (10 mg Oral Given 04/28/21 0856)  traZODone (DESYREL) tablet 100 mg (has no administration in time range)  pantoprazole (PROTONIX) EC tablet 40 mg (40 mg Oral Given 04/28/21 0856)  clonazePAM  (KLONOPIN) tablet 1 mg (1 mg Oral Given 04/28/21 0859)  gabapentin (NEURONTIN) capsule 600 mg (600 mg Oral Given 04/28/21 0855)  fluticasone (FLONASE) 50 MCG/ACT nasal spray 2 spray (2 sprays Each Nare Given 04/28/21 0900)  enoxaparin (LOVENOX) injection 40 mg (40 mg Subcutaneous Given 04/28/21 0044)  0.9 %  sodium chloride infusion ( Intravenous New Bag/Given 04/28/21 0050)  guaiFENesin-dextromethorphan (ROBITUSSIN DM) 100-10 MG/5ML syrup 10 mL (has no administration in time range)  chlorpheniramine-HYDROcodone (TUSSIONEX) 10-8 MG/5ML suspension 5 mL (has no administration in time range)  ascorbic acid (VITAMIN C) tablet 500 mg (500 mg Oral Given 04/28/21 0859)  zinc sulfate capsule 220 mg (220 mg Oral Given 04/28/21 0856)  magnesium hydroxide (MILK OF MAGNESIA) suspension 30 mL (has no administration in time range)  cholecalciferol (VITAMIN D3) tablet 1,000 Units (1,000 Units Oral Given 04/28/21 0858)  guaiFENesin (MUCINEX) 12 hr tablet 600 mg (600 mg Oral Given 04/28/21 0858)  insulin aspart (novoLOG) injection 0-20 Units (3 Units Subcutaneous Given 04/28/21 1233)  remdesivir 100 mg in sodium chloride 0.9 % 100 mL IVPB (0 mg Intravenous Stopped 04/28/21 0149)    Followed by  remdesivir 100 mg in sodium chloride 0.9 % 100 mL IVPB (0 mg Intravenous Stopped 04/28/21 0232)    Followed by  remdesivir 100 mg in sodium chloride 0.9 % 100 mL IVPB (has no administration in time range)  glipiZIDE (GLUCOTROL XL) 24 hr tablet 5 mg (has no administration in time range)  benzonatate (TESSALON) capsule 200 mg (200 mg Oral Given 04/28/21 1231)  insulin glargine (LANTUS) injection 5 Units (5 Units Subcutaneous Given 04/28/21 1233)  liver oil-zinc oxide (DESITIN) 40 % ointment (has no administration in time range)  sodium chloride 0.9 % bolus 1,000 mL (0 mLs Intravenous Stopped 04/27/21 2249)  sodium chloride 0.9 % bolus 1,000 mL (0 mLs Intravenous Stopped 04/28/21 0042)     ED Discharge Orders    None       Note:   This document was prepared using Dragon voice recognition software and may include unintentional dictation errors.   Vanessa Porter, MD 04/28/21 1321

## 2021-04-28 DIAGNOSIS — E1165 Type 2 diabetes mellitus with hyperglycemia: Secondary | ICD-10-CM | POA: Diagnosis not present

## 2021-04-28 LAB — CBC WITH DIFFERENTIAL/PLATELET
Abs Immature Granulocytes: 0.04 10*3/uL (ref 0.00–0.07)
Basophils Absolute: 0 10*3/uL (ref 0.0–0.1)
Basophils Relative: 1 %
Eosinophils Absolute: 0.2 10*3/uL (ref 0.0–0.5)
Eosinophils Relative: 3 %
HCT: 29.4 % — ABNORMAL LOW (ref 36.0–46.0)
Hemoglobin: 8.9 g/dL — ABNORMAL LOW (ref 12.0–15.0)
Immature Granulocytes: 1 %
Lymphocytes Relative: 27 %
Lymphs Abs: 1.5 10*3/uL (ref 0.7–4.0)
MCH: 21.3 pg — ABNORMAL LOW (ref 26.0–34.0)
MCHC: 30.3 g/dL (ref 30.0–36.0)
MCV: 70.5 fL — ABNORMAL LOW (ref 80.0–100.0)
Monocytes Absolute: 0.9 10*3/uL (ref 0.1–1.0)
Monocytes Relative: 15 %
Neutro Abs: 3.1 10*3/uL (ref 1.7–7.7)
Neutrophils Relative %: 53 %
Platelets: 159 10*3/uL (ref 150–400)
RBC: 4.17 MIL/uL (ref 3.87–5.11)
RDW: 16.3 % — ABNORMAL HIGH (ref 11.5–15.5)
WBC: 5.8 10*3/uL (ref 4.0–10.5)
nRBC: 0 % (ref 0.0–0.2)

## 2021-04-28 LAB — FERRITIN
Ferritin: 6 ng/mL — ABNORMAL LOW (ref 11–307)
Ferritin: 7 ng/mL — ABNORMAL LOW (ref 11–307)

## 2021-04-28 LAB — HEMOGLOBIN A1C
Hgb A1c MFr Bld: 11.9 % — ABNORMAL HIGH (ref 4.8–5.6)
Mean Plasma Glucose: 294.83 mg/dL

## 2021-04-28 LAB — GLUCOSE, CAPILLARY
Glucose-Capillary: 143 mg/dL — ABNORMAL HIGH (ref 70–99)
Glucose-Capillary: 150 mg/dL — ABNORMAL HIGH (ref 70–99)
Glucose-Capillary: 142 mg/dL — ABNORMAL HIGH (ref 70–99)
Glucose-Capillary: 193 mg/dL — ABNORMAL HIGH (ref 70–99)
Glucose-Capillary: 206 mg/dL — ABNORMAL HIGH (ref 70–99)

## 2021-04-28 LAB — LACTIC ACID, PLASMA: Lactic Acid, Venous: 2.4 mmol/L (ref 0.5–1.9)

## 2021-04-28 LAB — TROPONIN I (HIGH SENSITIVITY)
Troponin I (High Sensitivity): 8 ng/L (ref <18)
Troponin I (High Sensitivity): 8 ng/L (ref <18)

## 2021-04-28 LAB — LACTATE DEHYDROGENASE: LDH: 134 U/L (ref 98–192)

## 2021-04-28 LAB — CBG MONITORING, ED
Glucose-Capillary: 181 mg/dL — ABNORMAL HIGH (ref 70–99)
Glucose-Capillary: 205 mg/dL — ABNORMAL HIGH (ref 70–99)

## 2021-04-28 LAB — C-REACTIVE PROTEIN
CRP: 0.9 mg/dL (ref <1.0)
CRP: 0.9 mg/dL (ref <1.0)

## 2021-04-28 LAB — D-DIMER, QUANTITATIVE: D-Dimer, Quant: 0.82 ug/mL-FEU — ABNORMAL HIGH (ref 0.00–0.50)

## 2021-04-28 MED ORDER — ZINC OXIDE 40 % EX OINT
TOPICAL_OINTMENT | Freq: Three times a day (TID) | CUTANEOUS | Status: DC | PRN
  Filled 2021-04-28: qty 113

## 2021-04-28 MED ORDER — GLIPIZIDE ER 5 MG PO TB24
5.0000 mg | ORAL_TABLET | Freq: Two times a day (BID) | ORAL | Status: DC
  Administered 2021-04-28 – 2021-04-30 (×3): 5 mg via ORAL
  Filled 2021-04-28 (×6): qty 1

## 2021-04-28 MED ORDER — INSULIN GLARGINE 100 UNIT/ML ~~LOC~~ SOLN
5.0000 [IU] | Freq: Every day | SUBCUTANEOUS | Status: DC
  Administered 2021-04-28 – 2021-04-30 (×3): 5 [IU] via SUBCUTANEOUS
  Filled 2021-04-28 (×4): qty 0.05

## 2021-04-28 MED ORDER — BENZONATATE 100 MG PO CAPS
200.0000 mg | ORAL_CAPSULE | Freq: Three times a day (TID) | ORAL | Status: DC
  Administered 2021-04-28 – 2021-04-30 (×7): 200 mg via ORAL
  Filled 2021-04-28 (×7): qty 2

## 2021-04-28 NOTE — ED Notes (Signed)
Hospitalist at bedside 

## 2021-04-28 NOTE — Progress Notes (Signed)
PROGRESS NOTE  Laura [name]  DOB: 1951/10/22  PCP: Maryland Pink, MD CZY:[id number]  DOA: 04/27/2021  LOS: 1 day  Hospital Day: 2   Chief Complaint  Patient presents with  . Hyperglycemia    Brief narrative: Laura [name] is a 70 y.o. female with PMH significant for DM2, HTN, sleep apnea, liver cirrhosis, asthma, anxiety.  Patient presented to the ED on 5/21 with complaints of dry cough, generalized weakness, altered mental status, elevated blood glucose level progressively worsening over the course of 6 days. 3 days prior to presentation, she had an accidental fall.  On the day of admission, she had vomiting.  No fever  In the ED, hemodynamically stable Labs showed blood glucose level elevated to 435, hemoglobin at baseline Lactic acid elevated to 3.2  ED Course: Upon presentation to the ER blood pressure was 131/91 with otherwise normal vital signs.  Labs revealed hyperglycemia of 435 and hemoglobin low at 9 with MCV low at 69.  Lactic acid level was elevated to 3.2 and subsequently down to 2.4. Urinalysis showed more than 500 glucose with trace leukocytes rare bacteria and 6-10 WBCs.   COVID PCR was positive, influenza antigens negative. Chest x-ray normal. CT head showed mild generalized cerebral atrophy with no acute intracranial abnormality.   CT cervical spine showed mild multilevel degenerative changes with no acute C-spine fracture or subluxation.  Subjective: Patient was seen and examined this morning. Pleasant elderly Caucasian female.  Lying on bed.  Not in submental oxygen but has significant nasal intonation to voice and has persistent dry cough. Chart reviewed.  Assessment/Plan: COVID infection Acute respiratory failure with hypoxia  -Presented with cough, weakness, altered mental status -COVID test: PCR positive on admission -Chest imaging: No infiltrates on chest x-ray on admission. -Treatment: IV remdesivir started.  -Not on supplemental  oxygen. -Oxygen - SpO2: (!) 85 % -Her primary symptoms seem to be persistent dry cough.  Currently on Mucinex.  I would also add benzonatate. -Encouraged incentive spirometry, prone position, out of bed and early mobilization as much as possible -WBC and inflammatory markers trend as below. Recent Labs  Lab 04/27/21 1856 04/27/21 2055 04/27/21 2323 04/28/21 0558  SARSCOV2NAA  --  POSITIVE*  --   --   WBC 7.5  --   --  5.8  LATICACIDVEN  --  3.2* 2.4*  --   DDIMER  --   --  0.66* 0.82*  FERRITIN  --   --  6* 7*  LDH  --   --  134  --   CRP  --   --  0.9  --   ALT  --  23  --   --    Intractable nausea vomiting -Feels better this morning.  Wants to try full liquid diet today.  Uncontrolled type 2 diabetes mellitus Hyperglycemia -A1c 11.9 on 04/27/2021 -Home meds include Glipizide 5 mg twice daily, metformin 500 mg daily -Currently oral meds are on hold.  Patient is on sliding scale insulin with Accu-Cheks. -Blood sugar level is elevated 250 this morning.  May benefit from long-acting insulin.  Patient agrees to the plan of long-acting insulin.  I will start with Lantus 5 units this morning. Recent Labs  Lab 04/28/21 0053 04/28/21 0158 04/28/21 0522 04/28/21 0836 04/28/21 1222  GLUCAP 205* 181* 143* 150* 142*   Lactic acidosis -Lactic acid level elevated to 3.2 on admission.  Probably because of dehydration.  Trended down to 2.4 on recheck last night.  Expect further  improvement with IV hydration.  Recheck tomorrow. Recent Labs  Lab 04/27/21 2055 04/27/21 2323  LATICACIDVEN 3.2* 2.4*   Essential hypertension. -Continue Lopressor 25 mg twice daily, amlodipine 5 mg daily and losartan 50 mg daily. Keep HCTZ on hold. -Continue the same.  Dyslipidemia. -Continue Crestor.  Anxiety and depression. -Continue Klonopin 1 mg twice daily, trazodone 100 mg at bedtime as needed, Neurontin 600 mg 4 times daily.  GERD -Continue PPI  Mobility: Encourage ambulation Code Status:    Code Status: Full Code  Nutritional status: Body mass index is 31.89 kg/m.     Diet Order            Diet full liquid Room service appropriate? Yes; Fluid consistency: Thin  Diet effective now                 DVT prophylaxis: enoxaparin (LOVENOX) injection 40 mg Start: 04/27/21 2315   Antimicrobials:  None Fluid: Normal saline at 100 mill per hour to continue Consultants: None Family Communication:  Called and updated patient's daughter Ms. Marcio Araceli Bouche.  Status is: Inpatient  Remains inpatient appropriate because: Needs IV hydration, inpatient management of COVID  Dispo: The patient is from: Home              Anticipated d/c is to: Home in 2 to 3 days              Patient currently is not medically stable to d/c.   Difficult to place patient No     Infusions:  . sodium chloride 100 mL/hr at 04/28/21 0050  . [START ON 04/29/2021] remdesivir 100 mg in NS 100 mL      Scheduled Meds: . amLODipine  5 mg Oral Daily  . vitamin C  500 mg Oral Daily  . aspirin EC  81 mg Oral Daily  . benzonatate  200 mg Oral TID  . cholecalciferol  1,000 Units Oral Daily  . clonazePAM  1 mg Oral BID  . enoxaparin (LOVENOX) injection  40 mg Subcutaneous Q24H  . fluticasone  2 spray Each Nare Daily  . gabapentin  600 mg Oral QID  . glipiZIDE  5 mg Oral BID AC  . guaiFENesin  600 mg Oral BID  . insulin aspart  0-20 Units Subcutaneous Q4H  . insulin glargine  5 Units Subcutaneous Daily  . losartan  50 mg Oral Daily  . metoprolol tartrate  25 mg Oral BID  . pantoprazole  40 mg Oral Daily  . rosuvastatin  10 mg Oral Daily  . zinc sulfate  220 mg Oral Daily    Antimicrobials: Anti-infectives (From admission, onward)   Start     Dose/Rate Route Frequency Ordered Stop   04/29/21 1000  remdesivir 100 mg in sodium chloride 0.9 % 100 mL IVPB       "Followed by" Linked Group Details   100 mg 200 mL/hr over 30 Minutes Intravenous Daily 04/27/21 2330 05/01/21 0959   04/28/21 1000   remdesivir 100 mg in sodium chloride 0.9 % 100 mL IVPB  Status:  Discontinued       "Followed by" Linked Group Details   100 mg 200 mL/hr over 30 Minutes Intravenous Daily 04/27/21 2306 04/27/21 2326   04/28/21 0130  remdesivir 100 mg in sodium chloride 0.9 % 100 mL IVPB       "Followed by" Linked Group Details   100 mg 200 mL/hr over 30 Minutes Intravenous  Once 04/27/21 2330 04/28/21 0232   04/28/21 0100  remdesivir 100 mg in sodium chloride 0.9 % 100 mL IVPB       "Followed by" Linked Group Details   100 mg 200 mL/hr over 30 Minutes Intravenous  Once 04/27/21 2330 04/28/21 0149   04/27/21 2315  remdesivir 200 mg in sodium chloride 0.9% 250 mL IVPB  Status:  Discontinued       "Followed by" Linked Group Details   200 mg 580 mL/hr over 30 Minutes Intravenous Once 04/27/21 2306 04/27/21 2326      PRN meds: chlorpheniramine-HYDROcodone, guaiFENesin-dextromethorphan, liver oil-zinc oxide, magnesium hydroxide, traZODone   Objective: Vitals:   04/28/21 0834 04/28/21 1219  BP: (!) 127/102 (!) 106/37  Pulse: 71 62  Resp: 20 20  Temp: 98.9 F (37.2 C) 98.8 F (37.1 C)  SpO2: 98% (!) 85%    Intake/Output Summary (Last 24 hours) at 04/28/2021 1244 Last data filed at 04/28/2021 0600 Gross per 24 hour  Intake 2599.3 ml  Output --  Net 2599.3 ml   Filed Weights   04/27/21 1846  Weight: 81.6 kg   Weight change:  Body mass index is 31.89 kg/m.   Physical Exam: General exam: Pleasant, elderly Caucasian female.  Not in distress Skin: No rashes, lesions or ulcers. HEENT: Atraumatic, normocephalic, no obvious bleeding Lungs: Clear to auscultation laterally, persistent cough CVS: Regular rate and rhythm, no murmur GI/Abd soft, nontender, nondistended, bowel sound present CNS: Alert, awake, oriented x3 Psychiatry: Mood appropriate Extremities: No pedal edema, no calf tenderness  Data Review: I have personally reviewed the laboratory data and studies available.  Recent Labs   Lab 04/27/21 1856 04/28/21 0558  WBC 7.5 5.8  NEUTROABS  --  3.1  HGB 9.0* 8.9*  HCT 29.5* 29.4*  MCV 68.8* 70.5*  PLT 192 159   Recent Labs  Lab 04/27/21 1856  NA 131*  K 4.0  CL 95*  CO2 25  GLUCOSE 435*  BUN 14  CREATININE 0.91  CALCIUM 8.8*    F/u labs ordered Unresulted Labs (From admission, onward)          Start     Ordered   04/29/21 6073  Basic metabolic panel  Daily,   R     Question:  Specimen collection method  Answer:  Lab=Lab collect   04/28/21 1044   04/29/21 0500  Magnesium  Tomorrow morning,   R       Question:  Specimen collection method  Answer:  Lab=Lab collect   04/28/21 1044   04/29/21 0500  Phosphorus  Tomorrow morning,   R       Question:  Specimen collection method  Answer:  Lab=Lab collect   04/28/21 1044   04/29/21 0500  Lactic acid, plasma  Tomorrow morning,   R       Question:  Specimen collection method  Answer:  Lab=Lab collect   04/28/21 1044   04/28/21 0500  CBC with Differential/Platelet  Daily,   STAT      04/27/21 2306   04/28/21 0500  C-reactive protein  Daily,   STAT      04/27/21 2306   04/28/21 0500  D-dimer, quantitative  Daily,   STAT      04/27/21 2306   04/28/21 0500  Ferritin  Daily,   STAT      04/27/21 2306          Signed, Terrilee Croak, MD [facility] 04/28/2021

## 2021-04-29 DIAGNOSIS — E1165 Type 2 diabetes mellitus with hyperglycemia: Secondary | ICD-10-CM | POA: Diagnosis not present

## 2021-04-29 LAB — FERRITIN: Ferritin: 6 ng/mL — ABNORMAL LOW (ref 11–307)

## 2021-04-29 LAB — GLUCOSE, CAPILLARY
Glucose-Capillary: 101 mg/dL — ABNORMAL HIGH (ref 70–99)
Glucose-Capillary: 118 mg/dL — ABNORMAL HIGH (ref 70–99)
Glucose-Capillary: 128 mg/dL — ABNORMAL HIGH (ref 70–99)
Glucose-Capillary: 246 mg/dL — ABNORMAL HIGH (ref 70–99)
Glucose-Capillary: 254 mg/dL — ABNORMAL HIGH (ref 70–99)
Glucose-Capillary: 180 mg/dL — ABNORMAL HIGH (ref 70–99)

## 2021-04-29 LAB — CBC WITH DIFFERENTIAL/PLATELET
Abs Immature Granulocytes: 0.03 10*3/uL (ref 0.00–0.07)
Basophils Absolute: 0 10*3/uL (ref 0.0–0.1)
Basophils Relative: 1 %
Eosinophils Absolute: 0.2 10*3/uL (ref 0.0–0.5)
Eosinophils Relative: 4 %
HCT: 27.4 % — ABNORMAL LOW (ref 36.0–46.0)
Hemoglobin: 8.3 g/dL — ABNORMAL LOW (ref 12.0–15.0)
Immature Granulocytes: 1 %
Lymphocytes Relative: 27 %
Lymphs Abs: 1.2 10*3/uL (ref 0.7–4.0)
MCH: 21.4 pg — ABNORMAL LOW (ref 26.0–34.0)
MCHC: 30.3 g/dL (ref 30.0–36.0)
MCV: 70.8 fL — ABNORMAL LOW (ref 80.0–100.0)
Monocytes Absolute: 0.6 10*3/uL (ref 0.1–1.0)
Monocytes Relative: 14 %
Neutro Abs: 2.4 10*3/uL (ref 1.7–7.7)
Neutrophils Relative %: 53 %
Platelets: 149 10*3/uL — ABNORMAL LOW (ref 150–400)
RBC: 3.87 MIL/uL (ref 3.87–5.11)
RDW: 16.1 % — ABNORMAL HIGH (ref 11.5–15.5)
WBC: 4.5 10*3/uL (ref 4.0–10.5)
nRBC: 0 % (ref 0.0–0.2)

## 2021-04-29 LAB — C-REACTIVE PROTEIN: CRP: 0.9 mg/dL (ref <1.0)

## 2021-04-29 LAB — BASIC METABOLIC PANEL
Anion gap: 8 (ref 5–15)
BUN: 8 mg/dL (ref 8–23)
CO2: 26 mmol/L (ref 22–32)
Calcium: 8.2 mg/dL — ABNORMAL LOW (ref 8.9–10.3)
Chloride: 108 mmol/L (ref 98–111)
Creatinine, Ser: 0.71 mg/dL (ref 0.44–1.00)
GFR, Estimated: >60 mL/min (ref >60)
Glucose, Bld: 115 mg/dL — ABNORMAL HIGH (ref 70–99)
Potassium: 3.5 mmol/L (ref 3.5–5.1)
Sodium: 142 mmol/L (ref 135–145)

## 2021-04-29 LAB — D-DIMER, QUANTITATIVE: D-Dimer, Quant: 0.51 ug/mL-FEU — ABNORMAL HIGH (ref 0.00–0.50)

## 2021-04-29 LAB — PHOSPHORUS: Phosphorus: 4 mg/dL (ref 2.5–4.6)

## 2021-04-29 LAB — LACTIC ACID, PLASMA: Lactic Acid, Venous: 0.9 mmol/L (ref 0.5–1.9)

## 2021-04-29 LAB — MAGNESIUM: Magnesium: 1.9 mg/dL (ref 1.7–2.4)

## 2021-04-29 MED ORDER — FERROUS SULFATE 325 (65 FE) MG PO TABS
325.0000 mg | ORAL_TABLET | Freq: Every day | ORAL | Status: DC
  Administered 2021-04-30: 10:00:00 325 mg via ORAL
  Filled 2021-04-29: qty 1

## 2021-04-29 MED ORDER — INSULIN ASPART 100 UNIT/ML IJ SOLN: 0.0000 [IU] | Freq: Every day | INTRAMUSCULAR | Status: DC

## 2021-04-29 MED ORDER — SODIUM CHLORIDE 0.9 % IV SOLN
510.0000 mg | Freq: Once | INTRAVENOUS | Status: AC
  Administered 2021-04-29: 11:00:00 510 mg via INTRAVENOUS
  Filled 2021-04-29: qty 17

## 2021-04-29 MED ORDER — ACETAMINOPHEN 325 MG PO TABS
650.0000 mg | ORAL_TABLET | Freq: Four times a day (QID) | ORAL | Status: DC | PRN
Start: 2021-04-29 — End: 2021-04-30
  Administered 2021-04-29: 650 mg via ORAL
  Filled 2021-04-29: qty 2

## 2021-04-29 MED ORDER — METFORMIN HCL 500 MG PO TABS
500.0000 mg | ORAL_TABLET | Freq: Two times a day (BID) | ORAL | Status: DC
  Administered 2021-04-29 – 2021-04-30 (×2): 500 mg via ORAL
  Filled 2021-04-29 (×2): qty 1

## 2021-04-29 MED ORDER — INSULIN ASPART 100 UNIT/ML IJ SOLN
0.0000 [IU] | Freq: Three times a day (TID) | INTRAMUSCULAR | Status: DC
  Administered 2021-04-29 – 2021-04-30 (×2): 5 [IU] via SUBCUTANEOUS
  Administered 2021-04-30: 1 [IU] via SUBCUTANEOUS
  Filled 2021-04-29 (×3): qty 1

## 2021-04-29 NOTE — Evaluation (Signed)
Physical Therapy Evaluation Patient Details Name: Laura [name] MRN: [id number] DOB: Nov 14, 1951 Today's Date: 04/29/2021   History of Present Illness  Pt admitted for hyperglycemia due to diabetes and covid. Complaints of cough. HIstory includes asthma, anxiety, DM, HTN, liver cirrhosis, and falls.  Clinical Impression  Pt is a pleasant 70 year old female who was admitted for covid and hyperglycemia. Pt performs bed mobility, transfers, and ambulation with cga and RW. All mobility performed on RA. Pt demonstrates deficits with strength/endurance/mobility. Does appear slightly confused with poor insight into situation and asked if she needed her life alert while she was hospitalized. Discussed that she would benefit from more assistance in home. Per patient, daughter is able to stay with her temporarily while she recovers. Currently recommending RW for mobility; educated on correct use of equipment. Would benefit from skilled PT to address above deficits and promote optimal return to PLOF. Recommend transition to [place] upon discharge from acute hospitalization.   SaO2 on room air at rest = 95% SaO2 on room air while ambulating = 99%        Follow Up Recommendations Home health PT;Supervision for mobility/OOB    Equipment Recommendations  Rolling walker with 5" wheels    Recommendations for Other Services       Precautions / Restrictions Precautions Precautions: Fall Restrictions Weight Bearing Restrictions: No      Mobility  Bed Mobility Overal bed mobility: Needs Assistance Bed Mobility: Supine to Sit     Supine to sit: Min guard     General bed mobility comments: needs cues to initiate movement. Once seated at EOB, upright posture noted    Transfers Overall transfer level: Needs assistance Equipment used: Rolling walker (2 wheeled) Transfers: Sit to/from Stand Sit to Stand: Min guard         General transfer comment: safe  technique  Ambulation/Gait Ambulation/Gait assistance: Min guard Gait Distance (Feet): 80 Feet Assistive device: Rolling walker (2 wheeled) Gait Pattern/deviations: Step-through pattern     General Gait Details: ambulated around room in/out of bathroom. Able to navigate obstacles, however needs constant supervision due to confusion and lethargy level. Cues for RW use  Stairs            Wheelchair Mobility    Modified Rankin (Stroke Patients Only)       Balance Overall balance assessment: Needs assistance;History of Falls Sitting-balance support: Bilateral upper extremity supported Sitting balance-Leahy Scale: Good     Standing balance support: Bilateral upper extremity supported Standing balance-Leahy Scale: Good                               Pertinent Vitals/Pain Pain Assessment: No/denies pain    Home Living Family/patient expects to be discharged to:: Private residence Living Arrangements: Alone Available Help at Discharge: Family;Available PRN/intermittently Type of Home: House Home Access: Stairs to enter Entrance Stairs-Rails: None Entrance Stairs-Number of Steps: 4 steps on side and 1 step in garage Home Layout: One level Home Equipment: Walker - standard Additional Comments: does have life alert. husband recently passed in Sept 2021    Prior Function Level of Independence: Independent         Comments: indep and active prior. Was performing OP PT 1 week prior to admission     Hand Dominance        Extremity/Trunk Assessment   Upper Extremity Assessment Upper Extremity Assessment: Overall WFL for tasks assessed    Lower Extremity  Assessment Lower Extremity Assessment: Generalized weakness (B LE grossly 4/5)       Communication   Communication: No difficulties  Cognition Arousal/Alertness: Lethargic Behavior During Therapy:  (emotional) Overall Cognitive Status: Impaired/Different from baseline                                  General Comments: appears confused to situation. Very emotional throughout, often tearful about recent passing of husband      General Comments      Exercises Other Exercises Other Exercises: ambulated to bathroom with RW and CGA. Able to perform hygiene with supervision, however does need cues for initiation.   Assessment/Plan    PT Assessment Patient needs continued PT services  PT Problem List Decreased strength;Decreased balance;Decreased mobility;Decreased safety awareness;Decreased knowledge of use of DME       PT Treatment Interventions Gait training;DME instruction;Therapeutic exercise;Balance training    PT Goals (Current goals can be found in the Care Plan section)  Acute Rehab PT Goals Patient Stated Goal: to go home PT Goal Formulation: With patient Time For Goal Achievement: 05/13/21 Potential to Achieve Goals: Good    Frequency Min 2X/week   Barriers to discharge Decreased caregiver support      Co-evaluation               AM-PAC PT "6 Clicks" Mobility  Outcome Measure Help needed turning from your back to your side while in a flat bed without using bedrails?: A Little Help needed moving from lying on your back to sitting on the side of a flat bed without using bedrails?: A Little Help needed moving to and from a bed to a chair (including a wheelchair)?: A Little Help needed standing up from a chair using your arms (e.g., wheelchair or bedside chair)?: A Little Help needed to walk in hospital room?: A Little Help needed climbing 3-5 steps with a railing? : A Lot 6 Click Score: 17    End of Session Equipment Utilized During Treatment: Gait belt Activity Tolerance: Patient tolerated treatment well Patient left: in chair;with chair alarm set Nurse Communication: Mobility status PT Visit Diagnosis: Unsteadiness on feet (R26.81);Muscle weakness (generalized) (M62.81);History of falling (Z91.81);Difficulty in walking, not elsewhere  classified (R26.2)    Time: 4132-4401 PT Time Calculation (min) (ACUTE ONLY): 36 min   Charges:   PT Evaluation $PT Eval Low Complexity: 1 Low PT Treatments $Gait Training: 8-22 mins $Therapeutic Activity: 8-22 mins        Greggory Stallion, PT, DPT [phone]  Letta Cargile 04/29/2021, 4:31 PM

## 2021-04-29 NOTE — Progress Notes (Signed)
PROGRESS NOTE  Laura [name]  DOB: 1951-07-31  PCP: Maryland Pink, MD [id number]  DOA: 04/27/2021  LOS: 2 days  Hospital Day: 3   Chief Complaint  Patient presents with  . Hyperglycemia    Brief narrative: Laura [name] is a 70 y.o. female with PMH significant for DM2, HTN, sleep apnea, liver cirrhosis, asthma, anxiety.  Patient presented to the ED on 5/21 with complaints of dry cough, generalized weakness, altered mental status, elevated blood glucose level progressively worsening over the course of 6 days. 3 days prior to presentation, she had an accidental fall.  On the day of admission, she had vomiting.  No fever  In the ED, hemodynamically stable Labs showed blood glucose level elevated to 435, hemoglobin at baseline Lactic acid elevated to 3.2 and subsequently down to 2.4. Urinalysis showed more than 500 glucose with trace leukocytes rare bacteria and 6-10 WBCs.   COVID PCR was positive, influenza antigens negative. Chest x-ray normal. CT head showed mild generalized cerebral atrophy with no acute intracranial abnormality.   CT cervical spine showed mild multilevel degenerative changes with no acute C-spine fracture or subluxation.  Subjective: Patient was seen and examined this morning. Lying down in bed.  Continues to have cough but gradually improving.  Not on supplemental oxygen. Has not gotten out of bed yet.  Assessment/Plan: COVID infection Acute respiratory failure with hypoxia  -Presented with cough, weakness, altered mental status -COVID test: PCR positive on admission -Chest imaging: No infiltrates on chest x-ray on admission. -Treatment: Continue IV remdesivir -Not on supplemental oxygen. -Oxygen - SpO2: 96 % -Her primary symptoms seem to be persistent dry cough.  Currently on Mucinex and Tessalon Perles.  Gradually improving cough -Encouraged incentive spirometry, prone position, out of bed and early mobilization as much as possible -WBC and  inflammatory markers trend as below. Recent Labs  Lab 04/27/21 1856 04/27/21 2055 04/27/21 2323 04/28/21 0558 04/29/21 0445  SARSCOV2NAA  --  POSITIVE*  --   --   --   WBC 7.5  --   --  5.8 4.5  LATICACIDVEN  --  3.2* 2.4*  --  0.9  DDIMER  --   --  0.66* 0.82* 0.51*  FERRITIN  --   --  6* 7* 6*  LDH  --   --  134  --   --   CRP  --   --  0.9 0.9  --   ALT  --  23  --   --   --    Intractable nausea vomiting -Improved.  Able to tolerate regular diet.  Uncontrolled type 2 diabetes mellitus Hyperglycemia -A1c 11.9 on 04/27/2021 -Home meds include Glipizide 5 mg twice daily, metformin 500 mg daily -Currently patient is on glipizide and newly started on Lantus 5 units daily.  -Blood sugar level 118 this morning.  I would resume metformin today.  Continue sliding scale insulin with Accu-Cheks. Recent Labs  Lab 04/28/21 1710 04/28/21 2035 04/29/21 0132 04/29/21 0447 04/29/21 0818  GLUCAP 193* 206* 101* 118* 128*   Lactic acidosis -Lactic acid level elevated to 3.2 on admission, no other evidence of bacterial infection.  Lactic acid level improved with hydration. Recent Labs  Lab 04/27/21 2055 04/27/21 2323 04/29/21 0445  LATICACIDVEN 3.2* 2.4* 0.9   Chronic microcytic anemia Severe iron deficiency -No active bleeding but hemoglobin running lower than baseline at this time.  Ferritin level low at 6 despite active inflammation with COVID. -Since there is no active bleeding, would  recommend outpatient GI evaluation.  Patient states last EGD/colonoscopy was several years ago. -I will give 1 dose of Feraheme while in the hospital and plan to discharge on oral iron. Recent Labs    01/04/21 1423 01/21/21 1132 04/27/21 1856 04/27/21 2323 04/28/21 0558 04/29/21 0445  HGB 10.1* 9.6* 9.0*  --  8.9* 8.3*  MCV 77.3* 77.9* 68.8*  --  70.5* 70.8*  FERRITIN  --   --   --  6* 7* 6*   Essential hypertension. -Blood pressure in low normal range.  Continue Lopressor 25 mg twice  daily, amlodipine 5 mg daily and losartan 50 mg daily.  Continue to hold HCTZ.    Dyslipidemia. -Continue Crestor.  Anxiety and depression. -Continue Klonopin 1 mg twice daily, trazodone 100 mg at bedtime as needed, Neurontin 600 mg 4 times daily.  GERD -Continue PPI  Mobility: Encourage ambulation.  PT eval pending Code Status:   Code Status: Full Code  Nutritional status: Body mass index is 31.89 kg/m.     Diet Order            Diet regular Room service appropriate? Yes; Fluid consistency: Thin  Diet effective now                 DVT prophylaxis: enoxaparin (LOVENOX) injection 40 mg Start: 04/27/21 2315   Antimicrobials:  None Fluid: Okay to stop fluid today Consultants: None Family Communication:  Called and updated patient's daughter Ms. Marcio Araceli Bouche on 5/22.  Status is: Inpatient  Remains inpatient appropriate because: Needs IV hydration, inpatient management of COVID  Dispo: The patient is from: Home              Anticipated d/c is to: Pending PT eval.  Hopefully Home in 1 to 2 days              Patient currently is not medically stable to d/c.   Difficult to place patient No     Infusions:  . ferumoxytol    . remdesivir 100 mg in NS 100 mL 100 mg (04/29/21 0908)    Scheduled Meds: . amLODipine  5 mg Oral Daily  . vitamin C  500 mg Oral Daily  . aspirin EC  81 mg Oral Daily  . benzonatate  200 mg Oral TID  . cholecalciferol  1,000 Units Oral Daily  . clonazePAM  1 mg Oral BID  . enoxaparin (LOVENOX) injection  40 mg Subcutaneous Q24H  . [START ON 04/30/2021] ferrous sulfate  325 mg Oral Q breakfast  . fluticasone  2 spray Each Nare Daily  . gabapentin  600 mg Oral QID  . glipiZIDE  5 mg Oral BID AC  . guaiFENesin  600 mg Oral BID  . insulin aspart  0-20 Units Subcutaneous Q4H  . insulin glargine  5 Units Subcutaneous Daily  . losartan  50 mg Oral Daily  . metFORMIN  500 mg Oral BID WC  . metoprolol tartrate  25 mg Oral BID  . pantoprazole   40 mg Oral Daily  . rosuvastatin  10 mg Oral Daily  . zinc sulfate  220 mg Oral Daily    Antimicrobials: Anti-infectives (From admission, onward)   Start     Dose/Rate Route Frequency Ordered Stop   04/29/21 1000  remdesivir 100 mg in sodium chloride 0.9 % 100 mL IVPB       "Followed by" Linked Group Details   100 mg 200 mL/hr over 30 Minutes Intravenous Daily 04/27/21 2330 05/01/21 0959  04/28/21 1000  remdesivir 100 mg in sodium chloride 0.9 % 100 mL IVPB  Status:  Discontinued       "Followed by" Linked Group Details   100 mg 200 mL/hr over 30 Minutes Intravenous Daily 04/27/21 2306 04/27/21 2326   04/28/21 0130  remdesivir 100 mg in sodium chloride 0.9 % 100 mL IVPB       "Followed by" Linked Group Details   100 mg 200 mL/hr over 30 Minutes Intravenous  Once 04/27/21 2330 04/28/21 0232   04/28/21 0100  remdesivir 100 mg in sodium chloride 0.9 % 100 mL IVPB       "Followed by" Linked Group Details   100 mg 200 mL/hr over 30 Minutes Intravenous  Once 04/27/21 2330 04/28/21 0149   04/27/21 2315  remdesivir 200 mg in sodium chloride 0.9% 250 mL IVPB  Status:  Discontinued       "Followed by" Linked Group Details   200 mg 580 mL/hr over 30 Minutes Intravenous Once 04/27/21 2306 04/27/21 2326      PRN meds: acetaminophen, chlorpheniramine-HYDROcodone, guaiFENesin-dextromethorphan, liver oil-zinc oxide, magnesium hydroxide, traZODone   Objective: Vitals:   04/29/21 0006 04/29/21 0444  BP: (!) 120/57 (!) 109/54  Pulse: 66 69  Resp: 18 14  Temp: 99 F (37.2 C) 98.6 F (37 C)  SpO2: 94% 96%    Intake/Output Summary (Last 24 hours) at 04/29/2021 1022 Last data filed at 04/29/2021 0311 Gross per 24 hour  Intake --  Output 2950 ml  Net -2950 ml   Filed Weights   04/27/21 1846  Weight: 81.6 kg   Weight change:  Body mass index is 31.89 kg/m.   Physical Exam: General exam: Pleasant, elderly Caucasian female.  Not in distress.  Generalized weakness persists Skin: No  rashes, lesions or ulcers. HEENT: Atraumatic, normocephalic, no obvious bleeding Lungs: Clear on auscultation bilaterally, cough improving. CVS: Regular rate and rhythm, no murmur GI/Abd soft, nontender, nondistended, bowel sound present CNS: Alert, awake, oriented x3 Psychiatry: Mood appropriate Extremities: No pedal edema, no calf tenderness  Data Review: I have personally reviewed the laboratory data and studies available.  Recent Labs  Lab 04/27/21 1856 04/28/21 0558 04/29/21 0445  WBC 7.5 5.8 4.5  NEUTROABS  --  3.1 2.4  HGB 9.0* 8.9* 8.3*  HCT 29.5* 29.4* 27.4*  MCV 68.8* 70.5* 70.8*  PLT 192 159 149*   Recent Labs  Lab 04/27/21 1856 04/29/21 0445  NA 131* 142  K 4.0 3.5  CL 95* 108  CO2 25 26  GLUCOSE 435* 115*  BUN 14 8  CREATININE 0.91 0.71  CALCIUM 8.8* 8.2*  MG  --  1.9  PHOS  --  4.0    F/u labs ordered Unresulted Labs (From admission, onward)          Start     Ordered   04/29/21 6387  Basic metabolic panel  Daily,   R     Question:  Specimen collection method  Answer:  Lab=Lab collect   04/28/21 1044   04/28/21 0500  CBC with Differential/Platelet  Daily,   STAT      04/27/21 2306   04/28/21 0500  C-reactive protein  Daily,   STAT      04/27/21 2306   04/28/21 0500  D-dimer, quantitative  Daily,   STAT      04/27/21 2306   04/28/21 0500  Ferritin  Daily,   STAT      04/27/21 2306  Signed, Terrilee Croak, MD [facility] 04/29/2021

## 2021-04-29 NOTE — Progress Notes (Signed)
Inpatient Diabetes Program Recommendations  AACE/ADA: New Consensus Statement on Inpatient Glycemic Control   Target Ranges:  Prepandial:   less than 140 mg/dL      Peak postprandial:   less than 180 mg/dL (1-2 hours)      Critically ill patients:  140 - 180 mg/dL   Results for Laura [name], Laura [name] (MRN [id number]) as of 04/29/2021 10:46  Ref. Range 04/28/2021 08:36 04/28/2021 12:22 04/28/2021 17:10 04/28/2021 20:35 04/29/2021 01:32 04/29/2021 04:47 04/29/2021 08:18  Glucose-Capillary Latest Ref Range: 70 - 99 mg/dL 150 (H)  Novolog 3 units  Glipizide 5 mg  142 (H)  Novolog 3 units  Lantus 5 units 193 (H)  Novolog 4 units  Glipizide 5 mg  206 (H)  Novolog 7 units 101 (H) 118 (H) 128 (H)  Novolog 3 units  Lantus 5 units  Results for Laura [name], Laura [name] (MRN [id number]) as of 04/29/2021 10:46  Ref. Range 04/27/2021 23:23  Hemoglobin A1C Latest Ref Range: 4.8 - 5.6 % 11.9 (H)   Review of Glycemic Control  Diabetes history: DM2 Outpatient Diabetes medications: Glipizide XL 5 mg BID, Metformin 500 mg BID Current orders for Inpatient glycemic control: Lantus 5 units daily, Novolog 0-20 units Q4H, Glipizide XL 5 mg BID, Metformin 500 mg BID  Inpatient Diabetes Program Recommendations:    Diet: Please discontinue Regular diet and order Carb Modified diet.  Inpatient DM medication: Noted Metformin ordered today. Please change frequency of CBGs and Novolog to 0-20 units TID with meals, Novolog 0-5 units QHS.    HbgA1C:  A1C 11.9% on 04/27/21 indicating an average glucose of 295 mg/dl over the past 2-3 months. Per Care Everywhere, patient seen PCP on 03/27/21 and was referred to Endocrinologist and has an initial appointment with Dr. Honor Junes on 05/02/21.   NOTE: Spoke with patient over the phone about diabetes and home regimen for diabetes control. Patient reports being followed by PCP for diabetes management and confirms that she has an appointment with Endocrinologist this week. Asked patient to be  sure to call Dr. Sherren Mocha office and let them know she is in the hospital with [name] and will be unable to make appointment this week. Patient states that her daughter or grandson are suppose to call and let them know. Asked patient to be sure to get a new appointment to see Dr. Honor Junes so he can assist with getting DM under better control.  Patient states that she lives alone and her daughter lives close by and checks on her daily.  Patient states that she takes Glipizide and Metformin as noted above and that she is consistently taking them. Patient reports that even over the past week or more since she has been sick, she has been taking DM medications. Patient reports that her glucose has been running high for awhile now. Noted patient seen PCP on 03/27/21 and it was noted "Daughter notes that she has seen some improvement with the Effexor for depression. Patient still having a hard time though. Daughter is getting her out for walks and working in the yard. Does note that she seems to have a lot of issues with balance while walking and this makes it difficult for her. Sugars are still running high but improved from a few months ago. She has an appointment in a few weeks with endocrinology."  Patient reports checking glucose 2 times per day and notes it has been 300-500 mg/dl recently.  Discussed A1C results (11.9% on 04/27/21) and explained that current A1C indicates an average  glucose of 295 mg/dl over the past 2-3 months. Discussed glucose and A1C goals. Discussed importance of checking CBGs and maintaining good CBG control to prevent long-term and short-term complications. Stressed to the patient the importance of improving glycemic control to prevent further complications from uncontrolled diabetes. Patient asked if she needed to take insulin as an outpatient and reports that she has never taken insulin outpatient. Discussed current medication regimen ordered and explained that she has been given very low  dose basal insulin and Novolog correction for hyperglycemia.  Explained to patient that she likely needs some adjustments with outpatient DM medication if she is consistently taking them and her glucose and A1C continue to elevated. Patient states if she were to be prescribed insulin, she would want someone to come to her house and teach her about how to take insulin. Explained that if attending providers decide that she needs to be discharged on insulin, bedside nursing staff can educate her on insulin administration. Stressed to patient that she needs to be sure to reschedule her appointment with Dr. Honor Junes so he can assist with DM management.   Patient verbalized understanding of information discussed and reports no further questions at this time related to diabetes.  Thanks, Barnie Alderman, RN, MSN, CDE Diabetes Coordinator Inpatient Diabetes Program [phone] (Team Pager)

## 2021-04-30 DIAGNOSIS — E1165 Type 2 diabetes mellitus with hyperglycemia: Secondary | ICD-10-CM | POA: Diagnosis not present

## 2021-04-30 LAB — FERRITIN: Ferritin: 42 ng/mL (ref 11–307)

## 2021-04-30 LAB — CBC WITH DIFFERENTIAL/PLATELET
Abs Immature Granulocytes: 0.05 10*3/uL (ref 0.00–0.07)
Basophils Absolute: 0.1 10*3/uL (ref 0.0–0.1)
Basophils Relative: 1 %
Eosinophils Absolute: 0.2 10*3/uL (ref 0.0–0.5)
Eosinophils Relative: 4 %
HCT: 26.1 % — ABNORMAL LOW (ref 36.0–46.0)
Hemoglobin: 7.9 g/dL — ABNORMAL LOW (ref 12.0–15.0)
Immature Granulocytes: 1 %
Lymphocytes Relative: 32 %
Lymphs Abs: 1.5 10*3/uL (ref 0.7–4.0)
MCH: 21.5 pg — ABNORMAL LOW (ref 26.0–34.0)
MCHC: 30.3 g/dL (ref 30.0–36.0)
MCV: 70.9 fL — ABNORMAL LOW (ref 80.0–100.0)
Monocytes Absolute: 0.5 10*3/uL (ref 0.1–1.0)
Monocytes Relative: 11 %
Neutro Abs: 2.4 10*3/uL (ref 1.7–7.7)
Neutrophils Relative %: 51 %
Platelets: 156 10*3/uL (ref 150–400)
RBC: 3.68 MIL/uL — ABNORMAL LOW (ref 3.87–5.11)
RDW: 16.4 % — ABNORMAL HIGH (ref 11.5–15.5)
WBC: 4.7 10*3/uL (ref 4.0–10.5)
nRBC: 0 % (ref 0.0–0.2)

## 2021-04-30 LAB — BASIC METABOLIC PANEL
Anion gap: 9 (ref 5–15)
BUN: 13 mg/dL (ref 8–23)
CO2: 26 mmol/L (ref 22–32)
Calcium: 8.1 mg/dL — ABNORMAL LOW (ref 8.9–10.3)
Chloride: 104 mmol/L (ref 98–111)
Creatinine, Ser: 0.69 mg/dL (ref 0.44–1.00)
GFR, Estimated: >60 mL/min (ref >60)
Glucose, Bld: 145 mg/dL — ABNORMAL HIGH (ref 70–99)
Potassium: 3.6 mmol/L (ref 3.5–5.1)
Sodium: 139 mmol/L (ref 135–145)

## 2021-04-30 LAB — GLUCOSE, CAPILLARY
Glucose-Capillary: 138 mg/dL — ABNORMAL HIGH (ref 70–99)
Glucose-Capillary: 260 mg/dL — ABNORMAL HIGH (ref 70–99)

## 2021-04-30 LAB — C-REACTIVE PROTEIN: CRP: <0.5 mg/dL (ref <1.0)

## 2021-04-30 LAB — D-DIMER, QUANTITATIVE: D-Dimer, Quant: 0.34 ug/mL-FEU (ref 0.00–0.50)

## 2021-04-30 MED ORDER — BENZONATATE 200 MG PO CAPS
200.0000 mg | ORAL_CAPSULE | Freq: Three times a day (TID) | ORAL | 0 refills | Status: DC
Start: 2021-04-30 — End: 2021-12-08

## 2021-04-30 MED ORDER — METFORMIN HCL 500 MG PO TABS
1000.0000 mg | ORAL_TABLET | Freq: Two times a day (BID) | ORAL | Status: DC
  Filled 2021-04-30 (×2): qty 2

## 2021-04-30 MED ORDER — INSULIN GLARGINE 100 UNIT/ML SOLOSTAR PEN: 10.0000 [IU] | PEN_INJECTOR | Freq: Every day | SUBCUTANEOUS | 0 refills | Status: DC

## 2021-04-30 MED ORDER — FERROUS SULFATE 325 (65 FE) MG PO TABS: 325.0000 mg | ORAL_TABLET | Freq: Every day | ORAL | 2 refills | Status: DC

## 2021-04-30 MED ORDER — ESCITALOPRAM OXALATE 10 MG PO TABS
5.0000 mg | ORAL_TABLET | Freq: Every day | ORAL | Status: DC
  Filled 2021-04-30: qty 0.5

## 2021-04-30 MED ORDER — PANTOPRAZOLE SODIUM 40 MG PO TBEC: 40.0000 mg | DELAYED_RELEASE_TABLET | Freq: Every day | ORAL | 2 refills | Status: AC

## 2021-04-30 NOTE — Discharge Summary (Signed)
Physician Discharge Summary  Laura [name] WGN:[id number] DOB: 1951-08-10 DOA: 04/27/2021  PCP: Maryland Pink, MD  Admit date: 04/27/2021 Discharge date: 04/30/2021  Admitted From: Home Discharge disposition: Home with home health PT   Code Status: Full Code  Diet Recommendation: Cardiac/diabetic diet  Discharge Diagnosis:   Active Problems:   Uncontrolled type 2 diabetes mellitus with hyperglycemia [facility])  Chief Complaint  Patient presents with  . Hyperglycemia    Brief narrative: Laura [name] is a 70 y.o. female with PMH significant for DM2, HTN, sleep apnea, liver cirrhosis, asthma, anxiety.  Patient presented to the ED on 5/21 with complaints of dry cough, generalized weakness, altered mental status, elevated blood glucose level progressively worsening over the course of 6 days. 3 days prior to presentation, she had an accidental fall.  On the day of admission, she had vomiting.  No fever  In the ED, hemodynamically stable Labs showed blood glucose level elevated to 435, hemoglobin at baseline Lactic acid elevated to 3.2 and subsequently down to 2.4. Urinalysis showed more than 500 glucose with trace leukocytes rare bacteria and 6-10 WBCs.   COVID PCR was positive, influenza antigens negative. Chest x-ray normal. CT head showed mild generalized cerebral atrophy with no acute intracranial abnormality.   CT cervical spine showed mild multilevel degenerative changes with no acute C-spine fracture or subluxation.  Subjective: Patient was seen and examined this morning. Lying down in bed.  Cough improved.  Not on supplemental oxygen.  Patient has a depressed affect.  Assessment/Plan: COVID infection Acute respiratory failure with hypoxia  -Presented with cough, weakness, altered mental status -COVID test: PCR positive on admission -Chest imaging: No infiltrates on chest x-ray on admission. -Treatment: Treated with a course of IV remdesivir.  Cough improving on  Mucinex and Tessalon Perles. -Not on supplemental oxygen. -Oxygen - SpO2: 98 % on room air.  On ambulation, patient did not require any supplemental oxygen either. -WBC and inflammatory markers trend as below. Recent Labs  Lab 04/27/21 1856 04/27/21 2055 04/27/21 2323 04/28/21 0558 04/29/21 0445 04/30/21 0615  SARSCOV2NAA  --  POSITIVE*  --   --   --   --   WBC 7.5  --   --  5.8 4.5 4.7  LATICACIDVEN  --  3.2* 2.4*  --  0.9  --   DDIMER  --   --  0.66* 0.82* 0.51* 0.34  FERRITIN  --   --  6* 7* 6* 42  LDH  --   --  134  --   --   --   CRP  --   --  0.9 0.9 0.9 <0.5  ALT  --  23  --   --   --   --    Intractable nausea vomiting -Improved.  Able to tolerate regular diet.  Uncontrolled type 2 diabetes mellitus Hyperglycemia -A1c 11.9 on 04/27/2021 -Home meds include Glipizide 5 mg twice daily, metformin 500 mg daily. -Diabetes care coordinator consult.  Patient may have some noncompliance to her medicines. -Lantus 5 units daily was added in the hospital.  Blood sugar level still remains elevated 238 this morning. -I will discharge her with Lantus 10 units daily along with glipizide and metformin as before. Recent Labs  Lab 04/29/21 1210 04/29/21 1622 04/29/21 2126 04/30/21 0813 04/30/21 1155  GLUCAP 246* 254* 180* 138* 260*   Lactic acidosis -Lactic acid level elevated to 3.2 on admission, probably due to dehydration.  No other evidence of bacterial infection.  Lactic  acid level improved with hydration. Recent Labs  Lab 04/27/21 2055 04/27/21 2323 04/29/21 0445  LATICACIDVEN 3.2* 2.4* 0.9   Chronic microcytic anemia Severe iron deficiency -No active bleeding but hemoglobin running lower than baseline at this time.  Ferritin level low at 6 despite active inflammation with COVID. -Since there is no active bleeding, would recommend outpatient GI evaluation.  Patient states last EGD/colonoscopy was several years ago.  Ambulatory referral given for GI evaluation -She was  given 1 dose of Feraheme while in the hospital and plan to discharge on oral iron. Recent Labs    01/21/21 1132 04/27/21 1856 04/27/21 2323 04/28/21 0558 04/29/21 0445 04/30/21 0615  HGB 9.6* 9.0*  --  8.9* 8.3* 7.9*  MCV 77.9* 68.8*  --  70.5* 70.8* 70.9*  FERRITIN  --   --  6* 7* 6* 42   Essential hypertension. -Blood pressure in low normal range.  Continue Lopressor 25 mg twice daily, amlodipine 5 mg daily and losartan 50 mg daily.  Continue the same post discharge.Marland Kitchen  Dyslipidemia. -Continue Crestor.  Anxiety and depression. -Continue Klonopin 1 mg twice daily, trazodone 100 mg at bedtime as needed, Neurontin 600 mg 4 times daily. -Seems to have a depressed affect.  Encouraged to improve compliance to medications  GERD -Continue PPI   Wound care: Incision (Closed) 10/20/16 Arm Right (Active)  Date First Assessed/Time First Assessed: 10/20/16 1423   Location: Arm  Location Orientation: Right    Assessments 10/20/2016  2:23 PM 10/20/2016  4:16 PM  Dressing Type Liquid skin adhesive Liquid skin adhesive  Dressing -- Clean;Dry;Intact  Site / Wound Assessment -- Clean;Dry  Margins -- Attached edges (approximated)     No Linked orders to display    Discharge Exam:   Vitals:   04/30/21 0008 04/30/21 0604 04/30/21 0810 04/30/21 1154  BP: (!) 134/52 (!) 115/58 124/68 (!) 142/50  Pulse: 67 64 64 64  Resp: 18 15 18 18   Temp: 98.7 F (37.1 C) 97.9 F (36.6 C) 97.8 F (36.6 C) 98.3 F (36.8 C)  TempSrc: Oral Oral Oral Oral  SpO2: 98% (!) 89% 93% 98%  Weight:      Height:        Body mass index is 31.89 kg/m.  General exam: Pleasant elderly Caucasian female.  Lying on bed.  Not in distress Skin: No rashes, lesions or ulcers. HEENT: Atraumatic, normocephalic, no obvious bleeding Lungs: Clear to auscultate bilaterally CVS: Regular rate and rhythm, no murmur GI/Abd soft, nontender, nondistended, bowel sound present CNS: Alert, awake, oriented x3 Psychiatry: Sad  affect Extremities: No pedal edema, no calf tenderness  Follow ups:   Discharge Instructions    Ambulatory referral to Gastroenterology   Complete by: As directed    Anemia, low ferritin level   Diet - low sodium heart healthy   Complete by: As directed    Diet Carb Modified   Complete by: As directed    Increase activity slowly   Complete by: As directed       Follow-up Information    Maryland Pink, MD Follow up.   Specialty: Family Medicine Contact information: [address] [place] [address] [phone]               Recommendations for Outpatient Follow-Up:   1. Follow-up with PCP as an outpatient  Discharge Instructions:  Follow with Primary MD Maryland Pink, MD in 7 days   Get CBC/BMP checked in next visit within 1 week by  PCP or SNF MD ( we routinely change or add medications that can affect your baseline labs and fluid status, therefore we recommend that you get the mentioned basic workup next visit with your PCP, your PCP may decide not to get them or add new tests based on their clinical decision)  On your next visit with your PCP, please Get Medicines reviewed and adjusted.  Please request your PCP  to go over all Hospital Tests and Procedure/Radiological results at the follow up, please get all Hospital records sent to your Prim MD by signing hospital release before you go home.  Activity: As tolerated with Full fall precautions use walker/cane & assistance as needed  For Heart failure patients - Check your Weight same time everyday, if you gain over 2 pounds, or you develop in leg swelling, experience more shortness of breath or chest pain, call your Primary MD immediately. Follow Cardiac Low Salt Diet and 1.5 lit/day fluid restriction.  If you have smoked or chewed Tobacco in the last 2 yrs please stop smoking, stop any regular Alcohol  and or any Recreational drug use.  If you experience worsening of your admission  symptoms, develop shortness of breath, life threatening emergency, suicidal or homicidal thoughts you must seek medical attention immediately by calling 911 or calling your MD immediately  if symptoms less severe.  You Must read complete instructions/literature along with all the possible adverse reactions/side effects for all the Medicines you take and that have been prescribed to you. Take any new Medicines after you have completely understood and accpet all the possible adverse reactions/side effects.   Do not drive, operate heavy machinery, perform activities at heights, swimming or participation in water activities or provide baby sitting services if your were admitted for syncope or siezures until you have seen by Primary MD or a Neurologist and advised to do so again.  Do not drive when taking Pain medications.  Do not take more than prescribed Pain, Sleep and Anxiety Medications  Wear Seat belts while driving.   Please note You were cared for by a hospitalist during your hospital stay. If you have any questions about your discharge medications or the care you received while you were in the hospital after you are discharged, you can call the unit and asked to speak with the hospitalist on call if the hospitalist that took care of you is not available. Once you are discharged, your primary care physician will handle any further medical issues. Please note that NO REFILLS for any discharge medications will be authorized once you are discharged, as it is imperative that you return to your primary care physician (or establish a relationship with a primary care physician if you do not have one) for your aftercare needs so that they can reassess your need for medications and monitor your lab values.    Allergies as of 04/30/2021      Reactions   Atenolol Palpitations   Clarithromycin Other (See Comments), Nausea And Vomiting   Makes the tongue raw REACTION: nausea   Doxycycline Palpitations    Makes her heart beat fast Other reaction(s): GI Upset (intolerance) Loose bowels REACTION: loose stool   Sulfa Antibiotics Nausea Only   Erythromycin Base Nausea And Vomiting   Prednisone Other (See Comments)   Mood swings   Penicillins Rash   Has patient had a PCN reaction causing immediate rash, facial/tongue/throat swelling, SOB or lightheadedness with hypotension: Yes Has patient had a PCN reaction causing severe rash involving mucus  membranes or skin necrosis: No Has patient had a PCN reaction that required hospitalization No Has patient had a PCN reaction occurring within the last 10 years: No If all of the above answers are "NO", then may proceed with Cephalosporin use. REACTION: rash      Medication List    STOP taking these medications   aspirin EC 81 MG tablet   lisinopril 20 MG tablet Commonly known as: ZESTRIL   propranolol 10 MG tablet Commonly known as: INDERAL   VITAMIN C PO     TAKE these medications   amLODipine 5 MG tablet Commonly known as: NORVASC Take 5 mg by mouth daily.   benzonatate 200 MG capsule Commonly known as: TESSALON Take 1 capsule (200 mg total) by mouth 3 (three) times daily.   clonazePAM 1 MG tablet Commonly known as: KLONOPIN Take 1 tablet by mouth 2 (two) times daily.   ferrous sulfate 325 (65 FE) MG tablet Take 1 tablet (325 mg total) by mouth daily with breakfast. Start taking on: May 01, 2021   fluticasone 50 MCG/ACT nasal spray Commonly known as: FLONASE Place 2 sprays into the nose daily.   gabapentin 300 MG capsule Commonly known as: NEURONTIN TAKE 2 CAPSULES BY MOUTH 4 TIMES DAILY   glipiZIDE 5 MG 24 hr tablet Commonly known as: GLUCOTROL XL Take 5 mg by mouth 2 (two) times daily.   hydrochlorothiazide 25 MG tablet Commonly known as: HYDRODIURIL Take 25 mg by mouth daily.   insulin glargine 100 UNIT/ML Solostar Pen Commonly known as: LANTUS Inject 10 Units into the skin daily.   losartan 50 MG  tablet Commonly known as: COZAAR Take 50 mg by mouth daily.   metFORMIN 500 MG tablet Commonly known as: GLUCOPHAGE Take 500 mg by mouth 2 (two) times daily.   metoprolol tartrate 25 MG tablet Commonly known as: LOPRESSOR Take 25 mg by mouth 2 (two) times daily.   pantoprazole 40 MG tablet Commonly known as: PROTONIX Take 1 tablet (40 mg total) by mouth daily.   rosuvastatin 10 MG tablet Commonly known as: CRESTOR Take 10 mg by mouth daily.   traZODone 50 MG tablet Commonly known as: DESYREL Take 2 tablets by mouth at bedtime.   VITAMIN D3 PO Take 1 tablet by mouth daily.       Time coordinating discharge: 35 minutes  The results of significant diagnostics from this hospitalization (including imaging, microbiology, ancillary and laboratory) are listed below for reference.    Procedures and Diagnostic Studies:   DG Chest 2 View  Result Date: 04/27/2021 CLINICAL DATA:  Nonproductive cough. EXAM: CHEST - 2 VIEW COMPARISON:  July 26, 2019 FINDINGS: Chronic appearing increased interstitial lung markings are seen. There is no evidence of acute infiltrate, pleural effusion or pneumothorax. The heart size and mediastinal contours are within normal limits. Degenerative changes seen throughout the thoracic spine. IMPRESSION: Stable exam without active cardiopulmonary disease. Electronically Signed   By: Virgina Norfolk M.D.   On: 04/27/2021 19:55   CT Head Wo Contrast  Result Date: 04/27/2021 CLINICAL DATA:  Hypoglycemia with dry nonproductive cough. EXAM: CT HEAD WITHOUT CONTRAST TECHNIQUE: Contiguous axial images were obtained from the base of the skull through the vertex without intravenous contrast. COMPARISON:  January 21, 2021 FINDINGS: Brain: There is mild cerebral atrophy with widening of the extra-axial spaces and ventricular dilatation. There are areas of decreased attenuation within the white matter tracts of the supratentorial brain, consistent with microvascular  disease changes. Vascular: No hyperdense vessel  or unexpected calcification. Skull: Normal. Negative for fracture or focal lesion. Sinuses/Orbits: Very mild right maxillary sinus and bilateral ethmoid sinus mucosal thickening is seen. Other: None. IMPRESSION: 1. Mild generalized cerebral atrophy. 2. No acute intracranial abnormality. Electronically Signed   By: Virgina Norfolk M.D.   On: 04/27/2021 19:48   CT Cervical Spine Wo Contrast  Result Date: 04/27/2021 CLINICAL DATA:  "Being sick since December." EXAM: CT CERVICAL SPINE WITHOUT CONTRAST TECHNIQUE: Multidetector CT imaging of the cervical spine was performed without intravenous contrast. Multiplanar CT image reconstructions were also generated. COMPARISON:  January 21, 2021 FINDINGS: Alignment: Normal. Skull base and vertebrae: No acute fracture. No primary bone lesion or focal pathologic process. Soft tissues and spinal canal: No prevertebral fluid or swelling. No visible canal hematoma. Disc levels: Mild endplate sclerosis is seen at the levels of C2-C3, C4-C5 and C5-C6. Mild intervertebral disc space narrowing is noted at the level of C5-C6 and C6-C7. Mild bilateral multilevel facet joint hypertrophy is noted. Upper chest: Negative. Other: None. IMPRESSION: 1. No acute cervical spine fracture or subluxation. 2. Mild multilevel degenerative changes. Electronically Signed   By: Virgina Norfolk M.D.   On: 04/27/2021 19:50     Labs:   Basic Metabolic Panel: Recent Labs  Lab 04/27/21 1856 04/29/21 0445 04/30/21 0615  NA 131* 142 139  K 4.0 3.5 3.6  CL 95* 108 104  CO2 25 26 26   GLUCOSE 435* 115* 145*  BUN 14 8 13   CREATININE 0.91 0.71 0.69  CALCIUM 8.8* 8.2* 8.1*  MG  --  1.9  --   PHOS  --  4.0  --    GFR Estimated Creatinine Clearance: 65.3 mL/min (by C-G formula based on SCr of 0.69 mg/dL). Liver Function Tests: Recent Labs  Lab 04/27/21 2055  AST 39  ALT 23  ALKPHOS 82  BILITOT 0.7  PROT 6.8  ALBUMIN 4.0   No  results for input(s): LIPASE, AMYLASE in the last 168 hours. No results for input(s): AMMONIA in the last 168 hours. Coagulation profile No results for input(s): INR, PROTIME in the last 168 hours.  CBC: Recent Labs  Lab 04/27/21 1856 04/28/21 0558 04/29/21 0445 04/30/21 0615  WBC 7.5 5.8 4.5 4.7  NEUTROABS  --  3.1 2.4 2.4  HGB 9.0* 8.9* 8.3* 7.9*  HCT 29.5* 29.4* 27.4* 26.1*  MCV 68.8* 70.5* 70.8* 70.9*  PLT 192 159 149* 156   Cardiac Enzymes: No results for input(s): CKTOTAL, CKMB, CKMBINDEX, TROPONINI in the last 168 hours. BNP: Invalid input(s): POCBNP CBG: Recent Labs  Lab 04/29/21 1210 04/29/21 1622 04/29/21 2126 04/30/21 0813 04/30/21 1155  GLUCAP 246* 254* 180* 138* 260*   D-Dimer Recent Labs    04/29/21 0445 04/30/21 0615  DDIMER 0.51* 0.34   Hgb A1c Recent Labs    04/27/21 2323  HGBA1C 11.9*   Lipid Profile No results for input(s): CHOL, HDL, LDLCALC, TRIG, CHOLHDL, LDLDIRECT in the last 72 hours. Thyroid function studies No results for input(s): TSH, T4TOTAL, T3FREE, THYROIDAB in the last 72 hours.  Invalid input(s): FREET3 Anemia work up Recent Labs    04/29/21 0445 04/30/21 0615  FERRITIN 6* 12   Microbiology Recent Results (from the past 240 hour(s))  Blood culture (routine x 2)     Status: None (Preliminary result)   Collection Time: 04/27/21  8:55 PM   Specimen: BLOOD  Result Value Ref Range Status   Specimen Description BLOOD RIGHT ANTECUBITAL  Final   Special Requests   Final  BOTTLES DRAWN AEROBIC AND ANAEROBIC Blood Culture adequate volume   Culture   Final    NO GROWTH 3 DAYS Performed at [facility], [address] [place], [place] [address]    Report Status PENDING  Incomplete  Blood culture (routine x 2)     Status: None (Preliminary result)   Collection Time: 04/27/21  8:55 PM   Specimen: BLOOD  Result Value Ref Range Status   Specimen Description BLOOD BLOOD RIGHT FOREARM  Final   Special Requests    Final    BOTTLES DRAWN AEROBIC AND ANAEROBIC Blood Culture results may not be optimal due to an inadequate volume of blood received in culture bottles   Culture   Final    NO GROWTH 3 DAYS Performed at [facility], [address] [place], [place] [address]    Report Status PENDING  Incomplete  Resp Panel by RT-PCR (Flu A&B, Covid)     Status: Abnormal   Collection Time: 04/27/21  8:55 PM   Specimen: Nasopharyngeal(NP) swabs in vial transport medium  Result Value Ref Range Status   SARS Coronavirus 2 by RT PCR POSITIVE (A) NEGATIVE Final    Comment: RESULT CALLED TO, READ BACK BY AND VERIFIED WITH: DAWN TULLOCH @2155  04/27/21 MJU (NOTE) SARS-CoV-2 target nucleic acids are DETECTED.  The SARS-CoV-2 RNA is generally detectable in upper respiratory specimens during the acute phase of infection. Positive results are indicative of the presence of the identified virus, but do not rule out bacterial infection or co-infection with other pathogens not detected by the test. Clinical correlation with patient history and other diagnostic information is necessary to determine patient infection status. The expected result is Negative.  Fact Sheet for Patients: EntrepreneurPulse.com.au  Fact Sheet for Healthcare Providers: IncredibleEmployment.be  This test is not yet approved or cleared by the [place] FDA and  has been authorized for detection and/or diagnosis of SARS-CoV-2 by FDA under an Emergency Use Authorization (EUA).  This EUA will remain in effect (meaning this test can be [place] ed) for the duration of  the COVID-19 declaration under Section 564(b)(1) of the Act, 21 U.S.C. section 360bbb-3(b)(1), unless the authorization is terminated or revoked sooner.     Influenza A by PCR NEGATIVE NEGATIVE Final   Influenza B by PCR NEGATIVE NEGATIVE Final    Comment: (NOTE) The Xpert Xpress SARS-CoV-2/FLU/RSV plus assay is intended as an  aid in the diagnosis of influenza from Nasopharyngeal swab specimens and should not be used as a sole basis for treatment. Nasal washings and aspirates are unacceptable for Xpert Xpress SARS-CoV-2/FLU/RSV testing.  Fact Sheet for Patients: EntrepreneurPulse.com.au  Fact Sheet for Healthcare Providers: IncredibleEmployment.be  This test is not yet approved or cleared by the [place] FDA and has been authorized for detection and/or diagnosis of SARS-CoV-2 by FDA under an Emergency Use Authorization (EUA). This EUA will remain in effect (meaning this test can be used) for the duration of the COVID-19 declaration under Section 564(b)(1) of the Act, 21 U.S.C. section 360bbb-3(b)(1), unless the authorization is terminated or revoked.  Performed at [facility], [address] [place],  [address]      Signed: Terrilee Croak  [facility] 04/30/2021, 12:48 PM

## 2021-04-30 NOTE — TOC Transition Note (Signed)
Transition of Care [facility]) - CM/SW Discharge Note   Patient Details  Name: Laura [name] MRN: [id number] Date of Birth: 09/14/1951  Transition of Care [facility]) CM/SW Contact:  Kerin Salen, RN Phone Number: 04/30/2021, 4:04 PM   Clinical Narrative:  To discharge home with daughter transport. Daughter declined rolling walker states patient has one at home. TOC needs resolved.     Final next level of care: [place] Barriers to Discharge: Barriers Resolved   Patient Goals and CMS Choice Patient states their goals for this hospitalization and ongoing recovery are:: To return home with [facility] PT.      Discharge Placement                  Name of family member notified: Daughter Babs Sciara Patient and family notified of of transfer: 04/30/21  Discharge Plan and Services                DME Arranged: N/A DME Agency: NA       HH Arranged: PT [place] Agency: [place] ([place]) Date HH Agency Contacted: 04/30/21 Time [place]: 1603 Representative spoke with at [place]: [place] ([place]) Interventions     Readmission Risk Interventions No flowsheet data found.

## 2021-04-30 NOTE — Care Management Important Message (Signed)
[place] Message  Patient Details  Name: Laura [name] MRN: [id number] Date of Birth: 09-Apr-1951   Medicare Important Message Given:  Yes  Patient is in an isolation room so I talked with her by phone [phone]).  She asked me to call her daughter to review the Important Message from Medicare with her.  I called her daughter, Shonna Chock at [phone] and reviewed the form with her.  She is in agreement with the discharge today.  I asked if she would like a copy of the form and she replied it wasn't necessary.  I thanked her for her time.   Juliann Pulse A Recardo Linn 04/30/2021, 2:23 PM

## 2021-05-02 ENCOUNTER — Encounter: Payer: Self-pay | Admitting: *Deleted

## 2021-05-03 LAB — CULTURE, BLOOD (ROUTINE X 2)
Culture: NO GROWTH
Culture: NO GROWTH
Special Requests: ADEQUATE

## 2021-05-13 LAB — BLOOD GAS, VENOUS
Acid-Base Excess: 2.6 mmol/L — ABNORMAL HIGH (ref 0.0–2.0)
Bicarbonate: 27.2 mmol/L (ref 20.0–28.0)
O2 Saturation: 86.1 %
Patient temperature: 37
pCO2, Ven: 41 mmHg — ABNORMAL LOW (ref 44.0–60.0)
pH, Ven: 7.43 (ref 7.250–7.430)
pO2, Ven: 50 mmHg — ABNORMAL HIGH (ref 32.0–45.0)

## 2021-08-30 ENCOUNTER — Other Ambulatory Visit: Payer: Self-pay

## 2021-08-30 ENCOUNTER — Emergency Department
Admission: EM | Admit: 2021-08-30 | Discharge: 2021-08-30 | Disposition: A | Discharge status: Home / Self Care | Payer: Medicare Other | Attending: Emergency Medicine | Admitting: Emergency Medicine

## 2021-08-30 DIAGNOSIS — I10 Essential (primary) hypertension: Secondary | ICD-10-CM | POA: Insufficient documentation

## 2021-08-30 DIAGNOSIS — E1140 Type 2 diabetes mellitus with diabetic neuropathy, unspecified: Secondary | ICD-10-CM | POA: Diagnosis not present

## 2021-08-30 DIAGNOSIS — R300 Dysuria: Secondary | ICD-10-CM | POA: Diagnosis present

## 2021-08-30 DIAGNOSIS — Z7984 Long term (current) use of oral hypoglycemic drugs: Secondary | ICD-10-CM | POA: Insufficient documentation

## 2021-08-30 DIAGNOSIS — J45909 Unspecified asthma, uncomplicated: Secondary | ICD-10-CM | POA: Diagnosis not present

## 2021-08-30 DIAGNOSIS — Z794 Long term (current) use of insulin: Secondary | ICD-10-CM | POA: Diagnosis not present

## 2021-08-30 DIAGNOSIS — N390 Urinary tract infection, site not specified: Secondary | ICD-10-CM

## 2021-08-30 DIAGNOSIS — Z7951 Long term (current) use of inhaled steroids: Secondary | ICD-10-CM | POA: Insufficient documentation

## 2021-08-30 DIAGNOSIS — N3090 Cystitis, unspecified without hematuria: Secondary | ICD-10-CM

## 2021-08-30 DIAGNOSIS — E1165 Type 2 diabetes mellitus with hyperglycemia: Secondary | ICD-10-CM | POA: Insufficient documentation

## 2021-08-30 DIAGNOSIS — Z79899 Other long term (current) drug therapy: Secondary | ICD-10-CM | POA: Diagnosis not present

## 2021-08-30 LAB — URINALYSIS, COMPLETE (UACMP) WITH MICROSCOPIC
Bilirubin Urine: NEGATIVE
Glucose, UA: >=500 mg/dL — AB
Ketones, ur: NEGATIVE mg/dL
Nitrite: NEGATIVE
Protein, ur: NEGATIVE mg/dL
RBC / HPF: >50 RBC/hpf — ABNORMAL HIGH (ref 0–5)
Specific Gravity, Urine: 1.017 (ref 1.005–1.030)
WBC, UA: >50 WBC/hpf — ABNORMAL HIGH (ref 0–5)
pH: 5 (ref 5.0–8.0)

## 2021-08-30 LAB — BASIC METABOLIC PANEL
Anion gap: 11 (ref 5–15)
BUN: 15 mg/dL (ref 8–23)
CO2: 26 mmol/L (ref 22–32)
Calcium: 8.9 mg/dL (ref 8.9–10.3)
Chloride: 95 mmol/L — ABNORMAL LOW (ref 98–111)
Creatinine, Ser: 0.91 mg/dL (ref 0.44–1.00)
GFR, Estimated: >60 mL/min (ref >60)
Glucose, Bld: 441 mg/dL — ABNORMAL HIGH (ref 70–99)
Potassium: 3.8 mmol/L (ref 3.5–5.1)
Sodium: 132 mmol/L — ABNORMAL LOW (ref 135–145)

## 2021-08-30 LAB — CBC
HCT: 35.1 % — ABNORMAL LOW (ref 36.0–46.0)
Hemoglobin: 11.2 g/dL — ABNORMAL LOW (ref 12.0–15.0)
MCH: 24.6 pg — ABNORMAL LOW (ref 26.0–34.0)
MCHC: 31.9 g/dL (ref 30.0–36.0)
MCV: 77 fL — ABNORMAL LOW (ref 80.0–100.0)
Platelets: 154 10*3/uL (ref 150–400)
RBC: 4.56 MIL/uL (ref 3.87–5.11)
RDW: 14.8 % (ref 11.5–15.5)
WBC: 8.6 10*3/uL (ref 4.0–10.5)
nRBC: 0 % (ref 0.0–0.2)

## 2021-08-30 LAB — CBG MONITORING, ED
Glucose-Capillary: 320 mg/dL — ABNORMAL HIGH (ref 70–99)
Glucose-Capillary: 222 mg/dL — ABNORMAL HIGH (ref 70–99)

## 2021-08-30 MED ORDER — CEPHALEXIN 500 MG PO CAPS: 500.0000 mg | ORAL_CAPSULE | Freq: Three times a day (TID) | ORAL | 0 refills | Status: DC

## 2021-08-30 MED ORDER — SODIUM CHLORIDE 0.9 % IV SOLN
1.0000 g | Freq: Once | INTRAVENOUS | Status: AC
  Administered 2021-08-30: 1 g via INTRAVENOUS
  Filled 2021-08-30: qty 10

## 2021-08-30 MED ORDER — INSULIN ASPART 100 UNIT/ML IJ SOLN
8.0000 [IU] | Freq: Once | INTRAMUSCULAR | Status: AC
  Administered 2021-08-30: 8 [IU] via INTRAVENOUS
  Filled 2021-08-30: qty 1

## 2021-08-30 MED ORDER — SODIUM CHLORIDE 0.9 % IV BOLUS
1000.0000 mL | Freq: Once | INTRAVENOUS | Status: AC
  Administered 2021-08-30: 1000 mL via INTRAVENOUS

## 2021-08-30 NOTE — ED Triage Notes (Signed)
Pt sent from [facility] with c/o painful urination and states she noticed pink/red when she wiped. States she also has dark stools intermittently over the past year and has been to a doctor for that "but they havent dont anything about it." Pt is in NAD.

## 2021-08-30 NOTE — ED Provider Notes (Signed)
[facility] Emergency Department Provider Note  Time seen: 11:10 AM  I have reviewed the triage vital signs and the nursing notes.   HISTORY  Chief Complaint Hematuria   HPI Laura [name] is a 70 y.o. female with a past medical history of allergies, anxiety, depression, diabetes, hypertension, presents to the emergency department for blood in her urine and occasional dysuria.  According to the patient of the past several days she has noticed pink tinted urine mostly when she wipes.  States she has been experiencing some burning upon urination as well.  Denies any fever.  States she is currently following up with her doctor regarding her blood sugars being elevated recently.   Past Medical History:  Diagnosis Date   Allergy    Anxiety    Asthma    Chronic back pain    Chronic cough 07/01/10   PFT  FEV1 2.20 (93%), FEV 1% 81, TLC 4,12 (81%), DLCO 78%, no BD. normal chest CT, sinus 07/08/10   Cirrhosis, non-alcoholic ([place]) 88/41/6606   Depression    Diabetes mellitus type 2, uncomplicated ([place])    Diabetes mellitus without complication ([place])    Diabetic neuropathy ([place])    Diverticulosis    Dysrhythmia    H/O PALPITATIONS-SINCE ABLATION IN 2013 AT [facility], PALPITATIONS MUCH BETTER   GERD (gastroesophageal reflux disease)    NO MEDS   Hemorrhoids    HTN (hypertension)    NAFLD (nonalcoholic fatty liver disease) 01/18/2018   Neuropathy    Osteoarthritis    Sleep apnea    NO CPAP    Patient Active Problem List   Diagnosis Date Noted   Uncontrolled type 2 diabetes mellitus with hyperglycemia ([place]) 30/16/0109   Metabolic syndrome 32/35/5732   Other specified diabetes mellitus with other specified complication ([place]) 20/25/4270   Fatty liver 06/28/2020   Complex regional pain syndrome 07/21/2016   Lumbar radiculopathy 07/21/2016   DDD (degenerative disc disease), lumbar 07/02/2016   Facet syndrome, lumbar 07/02/2016   Allergic state 02/28/2016    Arthritis 02/28/2016   Acid reflux 02/28/2016   BP (high blood pressure) 02/28/2016   Chronic painful diabetic neuropathy ([place]) 02/05/2016   Difficulty sleeping 02/05/2016   Diabetic neuropathy ([place]) 01/31/2016   Reflux 11/19/2011   GERD 09/19/2010   OTHER DISEASES OF TRACHEA AND BRONCHUS 08/14/2010   FATTY LIVER DISEASE 07/22/2010   Cough 06/04/2010   ANXIETY DEPRESSION 05/18/2009   HYPERTENSION, UNSPECIFIED 05/18/2009   PALPITATIONS 05/18/2009    Past Surgical History:  Procedure Laterality Date   BRONCHOSCOPY  07/25/10   CARDIAC ELECTROPHYSIOLOGY STUDY AND ABLATION     CARPAL TUNNEL RELEASE     CATARACT EXTRACTION Bilateral Jan 2017   cathater ablation     for arrhythmia   COLONOSCOPY  2014   COLONOSCOPY WITH PROPOFOL N/A 09/07/2019   Procedure: COLONOSCOPY WITH PROPOFOL;  Surgeon: Toledo, Benay Pike, MD;  Location: [facility] ENDOSCOPY;  Service: Gastroenterology;  Laterality: N/A;   ESOPHAGOGASTRODUODENOSCOPY (EGD) WITH PROPOFOL N/A 07/05/2018   Procedure: ESOPHAGOGASTRODUODENOSCOPY (EGD) WITH PROPOFOL;  Surgeon: Manya Silvas, MD;  Location[facility]COPY;  Service: Endoscopy;  Laterality: N/A;   FOOT SURGERY     HEMORROIDECTOMY     LIPOMA EXCISION Right 10/20/2016   Procedure: EXCISION LIPOMA;  Surgeon: Christene Lye, MD;  Location: [facility] ORS;  Service: General;  Laterality: Right;   PARTIAL HYSTERECTOMY     TOENAIL EXCISION Right     Prior to Admission medications   Medication Sig Start  Date End Date Taking? Authorizing Provider  amLODipine (NORVASC) 5 MG tablet Take 5 mg by mouth daily. 12/26/20   [provider]  benzonatate (TESSALON) 200 MG capsule Take 1 capsule (200 mg total) by mouth 3 (three) times daily. 04/30/21   Terrilee Croak, MD  Cholecalciferol (VITAMIN D3 PO) Take 1 tablet by mouth daily. Patient not taking: Reported on 04/27/2021    [provider]  clonazePAM (KLONOPIN) 1 MG tablet Take 1 tablet by mouth 2 (two) times daily. 07/02/20    [provider]  ferrous sulfate 325 (65 FE) MG tablet Take 1 tablet (325 mg total) by mouth daily with breakfast. 05/01/21 07/30/21  Dahal, Marlowe Aschoff, MD  fluticasone (FLONASE) 50 MCG/ACT nasal spray Place 2 sprays into the nose daily.    [provider]  gabapentin (NEURONTIN) 300 MG capsule TAKE 2 CAPSULES BY MOUTH 4 TIMES DAILY 01/02/21   Cletis Athens, MD  glipiZIDE (GLUCOTROL XL) 5 MG 24 hr tablet Take 5 mg by mouth 2 (two) times daily.  01/29/16   [provider]  hydrochlorothiazide (HYDRODIURIL) 25 MG tablet Take 25 mg by mouth daily. 11/13/20   [provider]  insulin glargine (LANTUS) 100 UNIT/ML Solostar Pen Inject 10 Units into the skin daily. 04/30/21 07/29/21  Terrilee Croak, MD  losartan (COZAAR) 50 MG tablet Take 50 mg by mouth daily. 12/13/20   [provider]  metFORMIN (GLUCOPHAGE) 500 MG tablet Take 500 mg by mouth 2 (two) times daily. 12/15/20   [provider]  metoprolol tartrate (LOPRESSOR) 25 MG tablet Take 25 mg by mouth 2 (two) times daily. 11/20/20   [provider]  pantoprazole (PROTONIX) 40 MG tablet Take 1 tablet (40 mg total) by mouth daily. 04/30/21 07/29/21  Terrilee Croak, MD  rosuvastatin (CRESTOR) 10 MG tablet Take 10 mg by mouth daily. 11/17/20   [provider]  traZODone (DESYREL) 50 MG tablet Take 2 tablets by mouth at bedtime. 01/01/21   [provider]    Allergies  Allergen Reactions   Atenolol Palpitations   Clarithromycin Other (See Comments) and Nausea And Vomiting    Makes the tongue raw REACTION: nausea   Doxycycline Palpitations    Makes her heart beat fast Other reaction(s): GI Upset (intolerance) Loose bowels REACTION: loose stool   Sulfa Antibiotics Nausea Only   Erythromycin Base Nausea And Vomiting   Prednisone Other (See Comments)    Mood swings   Penicillins Rash    Has patient had a PCN reaction causing immediate rash, facial/tongue/throat swelling, SOB or  lightheadedness with hypotension: Yes Has patient had a PCN reaction causing severe rash involving mucus membranes or skin necrosis: No Has patient had a PCN reaction that required hospitalization No Has patient had a PCN reaction occurring within the last 10 years: No If all of the above answers are "NO", then may proceed with Cephalosporin use.  REACTION: rash     Family History  Problem Relation Age of Onset   Cancer Mother        stomach   Diabetes Mother    Cancer Father        brain   Heart disease Father    Cancer Brother        mouth   Heart disease Brother    Ovarian cancer Maternal Grandmother     Social History Social History   Tobacco Use   Smoking status: Never   Smokeless tobacco: Never  Vaping Use   Vaping Use: Never used  Substance Use Topics   Alcohol use: No    Alcohol/week: 0.0 standard drinks   Drug use: No    Review of Systems Constitutional: Negative for fever. Cardiovascular: Negative for chest pain. Respiratory: Negative for shortness of breath. Gastrointestinal: Negative for abdominal pain, vomiting  Genitourinary: Intermittent dysuria/burning.  Pink tint to her urine. Musculoskeletal: Negative for musculoskeletal complaints Neurological: Negative for headache All other ROS negative  ____________________________________________   PHYSICAL EXAM:  VITAL SIGNS: ED Triage Vitals  Enc Vitals Group     BP 08/30/21 1021 (!) 152/65     Pulse Rate 08/30/21 1021 88     Resp 08/30/21 1021 16     Temp 08/30/21 1021 98.8 F (37.1 C)     Temp Source 08/30/21 1021 Oral     SpO2 08/30/21 1021 97 %     Weight 08/30/21 1022 185 lb (83.9 kg)     Height 08/30/21 1022 5\' 5"  (1.651 m)     Head Circumference --      Peak Flow --      Pain Score 08/30/21 1022 5     Pain Loc --      Pain Edu? --      Excl. in [place]? --    Constitutional: Alert and oriented. Well appearing and in no distress. Eyes: Normal exam ENT      Head: Normocephalic and  atraumatic.      Mouth/Throat: Mucous membranes are moist. Cardiovascular: Normal rate, regular rhythm.  Respiratory: Normal respiratory effort without tachypnea nor retractions. Breath sounds are clear  Gastrointestinal: Soft and nontender. No distention.  Musculoskeletal: Nontender with normal range of motion in all extremities. Neurologic:  Normal speech and language. No gross focal neurologic deficits Skin:  Skin is warm, dry and intact.  Psychiatric: Mood and affect are normal.  ____________________________________________   INITIAL IMPRESSION / ASSESSMENT AND PLAN / ED COURSE  Pertinent labs & imaging results that were available during my care of the patient were reviewed by me and considered in my medical decision making (see chart for details).   Patient presents emergency department for intermittent dysuria/burning upon urination, pink tint to her urine and concerned over high blood sugars.  Patient's lab work shows a glucose of 441 with greater than 500 glucose in the urine along greater than 50 white cells red cells and rare bacteria likely indicative of urinary tract infection likely due to glucosuria.  We will place an IV IV hydrate dose IV insulin as well as IV Rocephin.  I will send a urine culture.  Remainder the patient's work-up is reassuring.  States several months ago she was placed on iron supplements hemoglobin today is approximately 3 points higher than several months ago.  Fingerstick blood sugars down to 222.  Patient has finished her IV antibiotics.  We will discharge the patient home on oral antibiotics have the patient follow-up with her doctor.  Patient agreeable to plan of care.  Laura [name] was evaluated in Emergency Department on 08/30/2021 for the symptoms described in the history of present illness. She was evaluated in the context of the global COVID-19 pandemic, which necessitated consideration that the patient might be at risk for infection with the  SARS-CoV-2 virus that causes COVID-19. Institutional protocols and algorithms that pertain to the evaluation of patients at risk for COVID-19 are in a state of rapid change based on information released by regulatory bodies including the CDC and federal and state organizations. These policies and algorithms were followed during  the patient's care in the ED.  ____________________________________________   FINAL CLINICAL IMPRESSION(S) / ED DIAGNOSES  Hyperglycemia Urinary tract infection   Harvest Dark, MD 08/30/21 1342

## 2021-08-30 NOTE — ED Notes (Signed)
Patient denies pain and is resting comfortably.  

## 2021-09-02 ENCOUNTER — Other Ambulatory Visit: Payer: Self-pay | Admitting: Neurology

## 2021-09-02 DIAGNOSIS — R413 Other amnesia: Secondary | ICD-10-CM

## 2021-09-02 LAB — URINE CULTURE: Culture: >=100000 — AB

## 2021-09-10 ENCOUNTER — Other Ambulatory Visit: Payer: Self-pay

## 2021-09-10 ENCOUNTER — Ambulatory Visit
Admission: RE | Admit: 2021-09-10 | Discharge: 2021-09-10 | Disposition: A | Discharge status: Home / Self Care | Payer: Medicare Other | Source: Ambulatory Visit | Attending: Neurology | Admitting: Neurology

## 2021-09-10 DIAGNOSIS — R413 Other amnesia: Secondary | ICD-10-CM | POA: Diagnosis not present

## 2021-09-18 ENCOUNTER — Other Ambulatory Visit (HOSPITAL_COMMUNITY): Payer: Self-pay | Admitting: Internal Medicine

## 2021-09-18 ENCOUNTER — Other Ambulatory Visit: Payer: Self-pay | Admitting: Internal Medicine

## 2021-09-18 DIAGNOSIS — K7460 Unspecified cirrhosis of liver: Secondary | ICD-10-CM

## 2021-09-26 ENCOUNTER — Other Ambulatory Visit: Payer: Self-pay | Admitting: Student

## 2021-09-26 DIAGNOSIS — G3184 Mild cognitive impairment, so stated: Secondary | ICD-10-CM

## 2021-09-30 ENCOUNTER — Other Ambulatory Visit: Payer: Self-pay

## 2021-09-30 ENCOUNTER — Ambulatory Visit
Admission: RE | Admit: 2021-09-30 | Discharge: 2021-09-30 | Disposition: A | Discharge status: Home / Self Care | Payer: Medicare Other | Source: Ambulatory Visit | Attending: Internal Medicine | Admitting: Internal Medicine

## 2021-09-30 DIAGNOSIS — K7460 Unspecified cirrhosis of liver: Secondary | ICD-10-CM | POA: Diagnosis not present

## 2021-10-07 ENCOUNTER — Other Ambulatory Visit: Payer: Self-pay

## 2021-10-07 ENCOUNTER — Ambulatory Visit
Admission: RE | Admit: 2021-10-07 | Discharge: 2021-10-07 | Disposition: A | Discharge status: Home / Self Care | Payer: Medicare Other | Source: Ambulatory Visit | Attending: Student | Admitting: Student

## 2021-10-07 DIAGNOSIS — G3184 Mild cognitive impairment, so stated: Secondary | ICD-10-CM | POA: Insufficient documentation

## 2021-10-07 MED ORDER — GADOBUTROL 1 MMOL/ML IV SOLN
7.2000 mL | Freq: Once | INTRAVENOUS | Status: AC | PRN
  Administered 2021-10-07: 7.2 mL via INTRAVENOUS

## 2021-12-08 ENCOUNTER — Emergency Department: Payer: Medicare Other

## 2021-12-08 ENCOUNTER — Emergency Department
Admission: EM | Admit: 2021-12-08 | Discharge: 2021-12-08 | Disposition: A | Discharge status: Home / Self Care | Payer: Medicare Other | Attending: Emergency Medicine | Admitting: Emergency Medicine

## 2021-12-08 ENCOUNTER — Telehealth: Payer: Self-pay | Admitting: Emergency Medicine

## 2021-12-08 ENCOUNTER — Other Ambulatory Visit: Payer: Self-pay

## 2021-12-08 ENCOUNTER — Encounter: Payer: Self-pay | Admitting: Emergency Medicine

## 2021-12-08 DIAGNOSIS — K529 Noninfective gastroenteritis and colitis, unspecified: Secondary | ICD-10-CM

## 2021-12-08 DIAGNOSIS — E1165 Type 2 diabetes mellitus with hyperglycemia: Secondary | ICD-10-CM | POA: Diagnosis not present

## 2021-12-08 DIAGNOSIS — R053 Chronic cough: Secondary | ICD-10-CM

## 2021-12-08 DIAGNOSIS — R739 Hyperglycemia, unspecified: Secondary | ICD-10-CM

## 2021-12-08 DIAGNOSIS — J45909 Unspecified asthma, uncomplicated: Secondary | ICD-10-CM | POA: Diagnosis not present

## 2021-12-08 DIAGNOSIS — R059 Cough, unspecified: Secondary | ICD-10-CM | POA: Insufficient documentation

## 2021-12-08 DIAGNOSIS — R079 Chest pain, unspecified: Secondary | ICD-10-CM | POA: Diagnosis not present

## 2021-12-08 DIAGNOSIS — Z20822 Contact with and (suspected) exposure to covid-19: Secondary | ICD-10-CM | POA: Diagnosis not present

## 2021-12-08 DIAGNOSIS — R197 Diarrhea, unspecified: Secondary | ICD-10-CM | POA: Diagnosis not present

## 2021-12-08 DIAGNOSIS — I10 Essential (primary) hypertension: Secondary | ICD-10-CM | POA: Insufficient documentation

## 2021-12-08 LAB — RESP PANEL BY RT-PCR (FLU A&B, COVID) ARPGX2
Influenza A by PCR: NEGATIVE
Influenza B by PCR: NEGATIVE
SARS Coronavirus 2 by RT PCR: NEGATIVE

## 2021-12-08 LAB — CBC
HCT: 35 % — ABNORMAL LOW (ref 36.0–46.0)
Hemoglobin: 11 g/dL — ABNORMAL LOW (ref 12.0–15.0)
MCH: 25.5 pg — ABNORMAL LOW (ref 26.0–34.0)
MCHC: 31.4 g/dL (ref 30.0–36.0)
MCV: 81.2 fL (ref 80.0–100.0)
Platelets: 173 10*3/uL (ref 150–400)
RBC: 4.31 MIL/uL (ref 3.87–5.11)
RDW: 14.8 % (ref 11.5–15.5)
WBC: 6.9 10*3/uL (ref 4.0–10.5)
nRBC: 0 % (ref 0.0–0.2)

## 2021-12-08 LAB — BASIC METABOLIC PANEL
Anion gap: 8 (ref 5–15)
BUN: 20 mg/dL (ref 8–23)
CO2: 30 mmol/L (ref 22–32)
Calcium: 9.1 mg/dL (ref 8.9–10.3)
Chloride: 96 mmol/L — ABNORMAL LOW (ref 98–111)
Creatinine, Ser: 0.78 mg/dL (ref 0.44–1.00)
GFR, Estimated: >60 mL/min (ref >60)
Glucose, Bld: 363 mg/dL — ABNORMAL HIGH (ref 70–99)
Potassium: 4 mmol/L (ref 3.5–5.1)
Sodium: 134 mmol/L — ABNORMAL LOW (ref 135–145)

## 2021-12-08 LAB — TROPONIN I (HIGH SENSITIVITY): Troponin I (High Sensitivity): 7 ng/L (ref <18)

## 2021-12-08 LAB — CBG MONITORING, ED: Glucose-Capillary: 354 mg/dL — ABNORMAL HIGH (ref 70–99)

## 2021-12-08 MED ORDER — ACETAMINOPHEN 500 MG PO TABS
1000.0000 mg | ORAL_TABLET | Freq: Once | ORAL | Status: AC
Start: 2021-12-08 — End: 2021-12-08
  Administered 2021-12-08: 1000 mg via ORAL
  Filled 2021-12-08: qty 2

## 2021-12-08 MED ORDER — NIRMATRELVIR/RITONAVIR (PAXLOVID)TABLET: 3.0000 | ORAL_TABLET | Freq: Two times a day (BID) | ORAL | 0 refills | Status: DC

## 2021-12-08 MED ORDER — NAPROXEN 500 MG PO TABS
500.0000 mg | ORAL_TABLET | Freq: Once | ORAL | Status: AC
  Administered 2021-12-08: 500 mg via ORAL
  Filled 2021-12-08: qty 1

## 2021-12-08 MED ORDER — BENZONATATE 100 MG PO CAPS: 100.0000 mg | ORAL_CAPSULE | Freq: Three times a day (TID) | ORAL | 0 refills | Status: AC | PRN

## 2021-12-08 NOTE — ED Provider Notes (Signed)
[facility] Provider Note    Event Date/Time   First MD Initiated Contact with Patient 12/08/21 1621     (approximate)   History   Chest Pain and Cough   HPI  Laura [name] is a 71 y.o. female with a past medical history of DM, HTN, OSA, cirrhosis, asthma and anxiety as well as asthma and recurrent bronchitis who presents for assessment of some posttussive substernal chest discomfort over the last 2 days.  This in the setting of chronic cough and diarrhea.  She states she is felt little short of breath in the last couple days but she is not sure if it is any worse than typical shortness of breath she has with exertion.  He denies tobacco abuse and states she has no pain except when she has coughing and does not have pain when she takes a deep breath.  She denies any headache, earache, sore throat, fevers, abdominal pain, vomiting, rash or extremity pain.  No other acute concerns at this time.      Physical Exam  Triage Vital Signs: ED Triage Vitals  Enc Vitals Group     BP 12/08/21 1348 129/89     Pulse Rate 12/08/21 1348 89     Resp 12/08/21 1348 20     Temp 12/08/21 1352 98.5 F (36.9 C)     Temp Source 12/08/21 1352 Oral     SpO2 12/08/21 1348 94 %     Weight 12/08/21 1350 184 lb (83.5 kg)     Height 12/08/21 1350 5\' 4"  (1.626 m)     Head Circumference --      Peak Flow --      Pain Score 12/08/21 1350 7     Pain Loc --      Pain Edu? --      Excl. in [place]? --     Most recent vital signs: Vitals:   12/08/21 1348 12/08/21 1352  BP: 129/89   Pulse: 89   Resp: 20   Temp:  98.5 F (36.9 C)  SpO2: 94%     General: Awake, no distress.  CV:  Good peripheral perfusion.  No murmurs rubs or gallops.  2+ radial pulses. Resp:  Normal effort.  No wheezes rhonchi or rales.  No respiratory distress.  Patient's respiratory rate is 18 my assessment. Abd:  No distention.  Soft throughout. Other:  No significant lower extremity edema.   ED Results  / Procedures / Treatments  Labs (all labs ordered are listed, but only abnormal results are displayed) Labs Reviewed  BASIC METABOLIC PANEL - Abnormal; Notable for the following components:      Result Value   Sodium 134 (*)    Chloride 96 (*)    Glucose, Bld 363 (*)    All other components within normal limits  CBC - Abnormal; Notable for the following components:   Hemoglobin 11.0 (*)    HCT 35.0 (*)    MCH 25.5 (*)    All other components within normal limits  CBG MONITORING, ED - Abnormal; Notable for the following components:   Glucose-Capillary 354 (*)    All other components within normal limits  RESP PANEL BY RT-PCR (FLU A&B, COVID) ARPGX2  TROPONIN I (HIGH SENSITIVITY)  TROPONIN I (HIGH SENSITIVITY)     EKG  ECG is remarkable for sinus rhythm with sinus arrhythmia and a ventricular rate of 87, nonspecific T wave change in inferior leads without other clear evidence of acute  ischemia.  Normal axis and unremarkable intervals.   RADIOLOGY  Chest x-ray reviewed by myself shows no focal consolidation, effusion, edema, pneumothorax or other clear acute thoracic process.  I also reviewed radiology interpretation and agree with this.  PROCEDURES:  Critical Care performed: No  Procedures    MEDICATIONS ORDERED IN ED: Medications  acetaminophen (TYLENOL) tablet 1,000 mg (1,000 mg Oral Given 12/08/21 1702)  naproxen (NAPROSYN) tablet 500 mg (500 mg Oral Given 12/08/21 1702)     IMPRESSION / MDM / ASSESSMENT AND PLAN / ED COURSE  I reviewed the triage vital signs and the nursing notes.                              Differential diagnosis includes, but is not limited to, ACS, PE, pneumonia, bronchitis, costochondritis, pleurisy, GI etiologies, anemia and metabolic derangements.  Chest x-ray reviewed by myself shows no focal consolidation, effusion, edema, pneumothorax or other clear acute thoracic process. I also reviewed radiology interpretation and agree with  this.  EKG and nonelevated troponin are not suggestive of ACS or myocarditis.  No significant arrhythmia today.  CBC shows no evidence of leukocytosis or acute anemia.  BMP without significant electrolyte or metabolic derangements.  Leukosis slightly high which seems to be a chronic problem for the patient and there is no evidence of acidosis to suggest DKA.  Discussed this with patient and recommendation for close outpatient follow-up neurology for her chest pain and respiratory symptoms to have her diabetes medications possibly adjusted.  I personally ambulated patient in her room for approximately 30 seconds.  During this time she had no respiratory rate greater than 20 or tachycardia and maintain SPO2 greater than 96%.  Given she does not have any chest pain or shortness of breath at rest and states he only feels her sternum is sore when she is coughing I have very low suspicion for PE.  I suspect likely an acute bronchitis possibly causing this.  We will send a COVID influenza PCR.  I advised patient she can follow this result up online.  Given stable vitals otherwise reassuring exam work-up I think she stable for discharge with outpatient follow-up.  Discharged in stable condition.  Strict return precautions advised and discussed.      FINAL CLINICAL IMPRESSION(S) / ED DIAGNOSES   Final diagnoses:  Chronic cough  Chronic diarrhea  Hyperglycemia  Chest pain, unspecified type     Rx / DC Orders   ED Discharge Orders          Ordered    benzonatate (TESSALON PERLES) 100 MG capsule  3 times daily PRN        12/08/21 1720             Note:  This document was prepared using Dragon voice recognition software and may include unintentional dictation errors.   Lucrezia Starch, MD 12/08/21 [phone]

## 2021-12-08 NOTE — Telephone Encounter (Signed)
I updated patient that her COVID result is negative today.

## 2021-12-08 NOTE — ED Triage Notes (Signed)
Pt via POV from home. Pt c/o mid-sternal CP. Pt also has a cough and SOB. Also endorses diarrhea. Pt is A&OX4 and NAD.

## 2022-03-26 ENCOUNTER — Other Ambulatory Visit: Payer: Self-pay | Admitting: Specialist

## 2022-03-26 DIAGNOSIS — R053 Chronic cough: Secondary | ICD-10-CM

## 2022-04-04 ENCOUNTER — Other Ambulatory Visit: Payer: Medicare Other

## 2022-04-11 ENCOUNTER — Ambulatory Visit
Admission: RE | Admit: 2022-04-11 | Discharge: 2022-04-11 | Disposition: A | Discharge status: Home / Self Care | Payer: Medicare Other | Source: Ambulatory Visit | Attending: Specialist | Admitting: Specialist

## 2022-04-11 DIAGNOSIS — R053 Chronic cough: Secondary | ICD-10-CM | POA: Diagnosis present

## 2022-04-30 ENCOUNTER — Encounter: Admission: RE | Payer: Self-pay | Source: Home / Self Care

## 2022-04-30 ENCOUNTER — Ambulatory Visit: Admission: RE | Admit: 2022-04-30 | Payer: Medicare Other | Source: Home / Self Care | Admitting: Internal Medicine

## 2022-04-30 SURGERY — COLONOSCOPY WITH PROPOFOL
Anesthesia: General

## 2022-07-28 ENCOUNTER — Encounter: Payer: Self-pay | Admitting: Intensive Care

## 2022-07-28 ENCOUNTER — Other Ambulatory Visit: Payer: Self-pay

## 2022-07-28 ENCOUNTER — Emergency Department
Admission: EM | Admit: 2022-07-28 | Discharge: 2022-07-28 | Disposition: A | Discharge status: Home / Self Care | Payer: Medicare Other | Attending: Student in an Organized Health Care Education/Training Program | Admitting: Student in an Organized Health Care Education/Training Program

## 2022-07-28 ENCOUNTER — Emergency Department: Payer: Medicare Other

## 2022-07-28 DIAGNOSIS — E119 Type 2 diabetes mellitus without complications: Secondary | ICD-10-CM | POA: Diagnosis not present

## 2022-07-28 DIAGNOSIS — M79602 Pain in left arm: Secondary | ICD-10-CM

## 2022-07-28 DIAGNOSIS — S0990XA Unspecified injury of head, initial encounter: Secondary | ICD-10-CM | POA: Diagnosis not present

## 2022-07-28 DIAGNOSIS — I10 Essential (primary) hypertension: Secondary | ICD-10-CM | POA: Insufficient documentation

## 2022-07-28 LAB — CBC
HCT: 36.4 % (ref 36.0–46.0)
Hemoglobin: 11.3 g/dL — ABNORMAL LOW (ref 12.0–15.0)
MCH: 25.2 pg — ABNORMAL LOW (ref 26.0–34.0)
MCHC: 31 g/dL (ref 30.0–36.0)
MCV: 81.1 fL (ref 80.0–100.0)
Platelets: 139 10*3/uL — ABNORMAL LOW (ref 150–400)
RBC: 4.49 MIL/uL (ref 3.87–5.11)
RDW: 15.3 % (ref 11.5–15.5)
WBC: 4.7 10*3/uL (ref 4.0–10.5)
nRBC: 0 % (ref 0.0–0.2)

## 2022-07-28 LAB — HEPATIC FUNCTION PANEL
ALT: 30 U/L (ref 0–44)
AST: 37 U/L (ref 15–41)
Albumin: 4.2 g/dL (ref 3.5–5.0)
Alkaline Phosphatase: 106 U/L (ref 38–126)
Bilirubin, Direct: 0.1 mg/dL (ref 0.0–0.2)
Indirect Bilirubin: 0.6 mg/dL (ref 0.3–0.9)
Total Bilirubin: 0.7 mg/dL (ref 0.3–1.2)
Total Protein: 7.2 g/dL (ref 6.5–8.1)

## 2022-07-28 LAB — BASIC METABOLIC PANEL
Anion gap: 6 (ref 5–15)
BUN: 26 mg/dL — ABNORMAL HIGH (ref 8–23)
CO2: 29 mmol/L (ref 22–32)
Calcium: 9.3 mg/dL (ref 8.9–10.3)
Chloride: 105 mmol/L (ref 98–111)
Creatinine, Ser: 0.76 mg/dL (ref 0.44–1.00)
GFR, Estimated: >60 mL/min (ref >60)
Glucose, Bld: 135 mg/dL — ABNORMAL HIGH (ref 70–99)
Potassium: 3.7 mmol/L (ref 3.5–5.1)
Sodium: 140 mmol/L (ref 135–145)

## 2022-07-28 LAB — TROPONIN I (HIGH SENSITIVITY)
Troponin I (High Sensitivity): 10 ng/L (ref <18)
Troponin I (High Sensitivity): 10 ng/L (ref <18)

## 2022-07-28 LAB — AMMONIA: Ammonia: 20 umol/L (ref 9–35)

## 2022-07-28 MED ORDER — LIDOCAINE 5 % EX PTCH
1.0000 | MEDICATED_PATCH | CUTANEOUS | Status: DC
  Administered 2022-07-28: 1 via TRANSDERMAL
  Filled 2022-07-28: qty 1

## 2022-07-28 MED ORDER — AMLODIPINE BESYLATE 5 MG PO TABS
5.0000 mg | ORAL_TABLET | Freq: Once | ORAL | Status: AC
  Administered 2022-07-28: 5 mg via ORAL
  Filled 2022-07-28: qty 1

## 2022-07-28 NOTE — ED Triage Notes (Addendum)
Patient sent over by urgent care for cardiac evaluation. Reports she went to be seen for ongoing left arm pain and they found her blood pressure to be elevated. Also reports having a fall 1-2 weeks ago and hitting her head on phone on the wall and caused hematoma. Patient denies any other symptoms

## 2022-07-28 NOTE — ED Provider Notes (Signed)
[facility] Provider Note    Event Date/Time   First MD Initiated Contact with Patient 07/28/22 1157     (approximate)   History   Arm Pain and Hypertension   HPI  Laura [name] is a 71 y.o. female   history of Karlene Lineman cirrhosis diabetes hypertension presents to the ER for evaluation of left arm pain.  Initially went to urgent care because she was having left arm pain and her blood pressure was elevated was sent to the ER for cardiac work-up.  Reported did have a fall few days ago she was working in the yard thinks that she hit her head.  Denies any numbness or tingling.  Describes mild aching pain in her mid upper arm no swelling or contusions.  No fevers denies any chest pain.  No pain or discomfort radiating through to her back.      Physical Exam   Triage Vital Signs: ED Triage Vitals  Enc Vitals Group     BP 07/28/22 1144 (!) 166/59     Pulse Rate 07/28/22 1144 65     Resp 07/28/22 1144 16     Temp 07/28/22 1144 98.7 F (37.1 C)     Temp Source 07/28/22 1144 Oral     SpO2 07/28/22 1144 97 %     Weight 07/28/22 1139 180 lb (81.6 kg)     Height 07/28/22 1139 '5\' 5"'$  (1.651 m)     Head Circumference --      Peak Flow --      Pain Score 07/28/22 1138 7     Pain Loc --      Pain Edu? --      Excl. in [place]? --     Most recent vital signs: Vitals:   07/28/22 1144  BP: (!) 166/59  Pulse: 65  Resp: 16  Temp: 98.7 F (37.1 C)  SpO2: 97%     Constitutional: Alert  Eyes: Conjunctivae are normal.  Head: Atraumatic. Nose: No congestion/rhinnorhea. Mouth/Throat: Mucous membranes are moist.   Neck: Painless ROM.  Cardiovascular:   Good peripheral circulation. Respiratory: Normal respiratory effort.  No retractions.  Gastrointestinal: Soft and nontender.  Musculoskeletal:  no deformity Neurologic:  MAE spontaneously. No gross focal neurologic deficits are appreciated.  Skin:  Skin is warm, dry and intact. No rash noted. Psychiatric: Mood and  affect are normal. Speech and behavior are normal.    ED Results / Procedures / Treatments   Labs (all labs ordered are listed, but only abnormal results are displayed) Labs Reviewed  BASIC METABOLIC PANEL - Abnormal; Notable for the following components:      Result Value   Glucose, Bld 135 (*)    BUN 26 (*)    All other components within normal limits  CBC - Abnormal; Notable for the following components:   Hemoglobin 11.3 (*)    MCH 25.2 (*)    Platelets 139 (*)    All other components within normal limits  HEPATIC FUNCTION PANEL  AMMONIA  TROPONIN I (HIGH SENSITIVITY)  TROPONIN I (HIGH SENSITIVITY)     EKG  ED ECG REPORT I, Merlyn Lot, the attending physician, personally viewed and interpreted this ECG.   Date: 07/28/2022  EKG Time: 11:39  Rate: 66  Rhythm: sinus  Axis: normal  Intervals: normal  ST&T Change:  no stemi, no depressions    RADIOLOGY Please see ED Course for my review and interpretation.  I personally reviewed all radiographic images ordered to  evaluate for the above acute complaints and reviewed radiology reports and findings.  These findings were personally discussed with the patient.  Please see medical record for radiology report.    PROCEDURES:  Critical Care performed: no  Procedures   MEDICATIONS ORDERED IN ED: Medications  amLODipine (NORVASC) tablet 5 mg (has no administration in time range)  lidocaine (LIDODERM) 5 % 1 patch (has no administration in time range)     IMPRESSION / MDM / ASSESSMENT AND PLAN / ED COURSE  I reviewed the triage vital signs and the nursing notes.                              Differential diagnosis includes, but is not limited to, ACS, pericarditis, esophagitis,  pe, dissection, pna, bronchitis, costochondritis  Patient presented to the ER for evaluation of symptoms as described above.  This presenting complaint could reflect a potentially life-threatening illness therefore the patient will  be placed on continuous pulse oximetry and telemetry for monitoring.  Laboratory evaluation will be sent to evaluate for the above complaints.  Clinically have a low suspicion for ACS, dissection or PE.  EKG is nonischemic initial troponin negative she is mildly hypertensive but in no acute distress.  CT imaging ordered out of triage for the but differential does not show any evidence of acute intracranial abnormality.  Have a low suspicion for CVA or concussion.   Clinical Course as of 07/28/22 1456  Mon Jul 28, 2022  1225 Chest x-ray on my review and interpretation does not show any evidence of infiltrate. [PR]  1451 Patient reassessed.  Feels well.  Imaging and work-up is reassuring.  At this point I do suspect patient likely has muscle strain after working in the garden as she reports.  Do believe she stable and appropriate for outpatient follow-up. [PR]    Clinical Course User Index [PR] Merlyn Lot, MD     FINAL CLINICAL IMPRESSION(S) / ED DIAGNOSES   Final diagnoses:  Left arm pain     Rx / DC Orders   ED Discharge Orders     None        Note:  This document was prepared using Dragon voice recognition software and may include unintentional dictation errors.    Merlyn Lot, MD 07/28/22 [phone]

## 2022-07-28 NOTE — ED Notes (Signed)
Pt A&O, IV removed, pt given discharge instructions, pt ambulating with steady gait. 

## 2022-07-28 NOTE — ED Notes (Signed)
First Nurse Note: Patient sent to ED from [facility] for chills, cough, fever, and left shoulder pain x3 weeks. [facility] MD wanting cardiac evaluation. Patient denies any CP at this time. BP 175/52 at [facility].

## 2022-07-29 ENCOUNTER — Other Ambulatory Visit: Payer: Self-pay | Admitting: Internal Medicine

## 2022-07-29 ENCOUNTER — Other Ambulatory Visit (HOSPITAL_COMMUNITY): Payer: Self-pay | Admitting: Internal Medicine

## 2022-07-29 DIAGNOSIS — K7460 Unspecified cirrhosis of liver: Secondary | ICD-10-CM

## 2022-07-30 ENCOUNTER — Ambulatory Visit: Admit: 2022-07-30 | Payer: Medicare Other | Admitting: Internal Medicine

## 2022-07-30 SURGERY — COLONOSCOPY
Anesthesia: General

## 2022-08-06 ENCOUNTER — Ambulatory Visit
Admission: RE | Admit: 2022-08-06 | Discharge: 2022-08-06 | Disposition: A | Discharge status: Home / Self Care | Payer: Medicare Other | Source: Ambulatory Visit | Attending: Internal Medicine | Admitting: Internal Medicine

## 2022-08-06 DIAGNOSIS — K7460 Unspecified cirrhosis of liver: Secondary | ICD-10-CM | POA: Insufficient documentation

## 2022-10-22 ENCOUNTER — Ambulatory Visit: Admit: 2022-10-22 | Payer: Medicare Other | Admitting: Internal Medicine

## 2022-10-22 SURGERY — COLONOSCOPY
Anesthesia: General

## 2022-10-24 ENCOUNTER — Other Ambulatory Visit: Payer: Self-pay | Admitting: Student

## 2022-10-24 DIAGNOSIS — G3184 Mild cognitive impairment, so stated: Secondary | ICD-10-CM

## 2022-10-24 DIAGNOSIS — R410 Disorientation, unspecified: Secondary | ICD-10-CM

## 2022-11-03 ENCOUNTER — Other Ambulatory Visit: Payer: Self-pay | Admitting: Student

## 2022-11-03 DIAGNOSIS — R410 Disorientation, unspecified: Secondary | ICD-10-CM

## 2022-11-03 DIAGNOSIS — G3184 Mild cognitive impairment, so stated: Secondary | ICD-10-CM

## 2022-11-05 ENCOUNTER — Other Ambulatory Visit: Payer: Medicare Other

## 2022-11-13 ENCOUNTER — Ambulatory Visit
Admission: RE | Admit: 2022-11-13 | Discharge: 2022-11-13 | Disposition: A | Discharge status: Home / Self Care | Payer: Medicare Other | Source: Ambulatory Visit | Attending: Student | Admitting: Student

## 2022-11-13 DIAGNOSIS — G3184 Mild cognitive impairment, so stated: Secondary | ICD-10-CM | POA: Insufficient documentation

## 2022-11-13 DIAGNOSIS — R410 Disorientation, unspecified: Secondary | ICD-10-CM | POA: Insufficient documentation

## 2022-11-13 MED ORDER — GADOBUTROL 1 MMOL/ML IV SOLN
8.0000 mL | Freq: Once | INTRAVENOUS | Status: AC | PRN
  Administered 2022-11-13: 8 mL via INTRAVENOUS

## 2023-04-19 ENCOUNTER — Inpatient Hospital Stay
Admission: EM | Admit: 2023-04-19 | Discharge: 2023-04-20 | DRG: 442 | Disposition: A | Discharge status: Home Health | Payer: Medicare Other | Attending: Internal Medicine | Admitting: Internal Medicine

## 2023-04-19 ENCOUNTER — Other Ambulatory Visit: Payer: Self-pay

## 2023-04-19 ENCOUNTER — Emergency Department: Payer: Medicare Other

## 2023-04-19 DIAGNOSIS — E1169 Type 2 diabetes mellitus with other specified complication: Secondary | ICD-10-CM | POA: Diagnosis not present

## 2023-04-19 DIAGNOSIS — K7682 Hepatic encephalopathy: Secondary | ICD-10-CM | POA: Diagnosis not present

## 2023-04-19 DIAGNOSIS — K7581 Nonalcoholic steatohepatitis (NASH): Secondary | ICD-10-CM | POA: Insufficient documentation

## 2023-04-19 DIAGNOSIS — K219 Gastro-esophageal reflux disease without esophagitis: Secondary | ICD-10-CM | POA: Diagnosis not present

## 2023-04-19 DIAGNOSIS — E1165 Type 2 diabetes mellitus with hyperglycemia: Secondary | ICD-10-CM | POA: Diagnosis present

## 2023-04-19 DIAGNOSIS — J45909 Unspecified asthma, uncomplicated: Secondary | ICD-10-CM | POA: Diagnosis not present

## 2023-04-19 DIAGNOSIS — Z8249 Family history of ischemic heart disease and other diseases of the circulatory system: Secondary | ICD-10-CM | POA: Diagnosis not present

## 2023-04-19 DIAGNOSIS — G9340 Encephalopathy, unspecified: Secondary | ICD-10-CM | POA: Diagnosis not present

## 2023-04-19 DIAGNOSIS — R4182 Altered mental status, unspecified: Principal | ICD-10-CM

## 2023-04-19 DIAGNOSIS — Z881 Allergy status to other antibiotic agents status: Secondary | ICD-10-CM

## 2023-04-19 DIAGNOSIS — K7460 Unspecified cirrhosis of liver: Secondary | ICD-10-CM | POA: Diagnosis not present

## 2023-04-19 DIAGNOSIS — Z8041 Family history of malignant neoplasm of ovary: Secondary | ICD-10-CM | POA: Diagnosis not present

## 2023-04-19 DIAGNOSIS — Z833 Family history of diabetes mellitus: Secondary | ICD-10-CM

## 2023-04-19 DIAGNOSIS — Z794 Long term (current) use of insulin: Secondary | ICD-10-CM

## 2023-04-19 DIAGNOSIS — Z885 Allergy status to narcotic agent status: Secondary | ICD-10-CM

## 2023-04-19 DIAGNOSIS — Z800 Family history of malignant neoplasm of digestive organs: Secondary | ICD-10-CM | POA: Diagnosis not present

## 2023-04-19 DIAGNOSIS — E119 Type 2 diabetes mellitus without complications: Secondary | ICD-10-CM | POA: Diagnosis present

## 2023-04-19 DIAGNOSIS — Z880 Allergy status to penicillin: Secondary | ICD-10-CM

## 2023-04-19 DIAGNOSIS — Z7984 Long term (current) use of oral hypoglycemic drugs: Secondary | ICD-10-CM | POA: Diagnosis not present

## 2023-04-19 DIAGNOSIS — E7220 Disorder of urea cycle metabolism, unspecified: Secondary | ICD-10-CM | POA: Diagnosis not present

## 2023-04-19 DIAGNOSIS — M1990 Unspecified osteoarthritis, unspecified site: Secondary | ICD-10-CM | POA: Diagnosis present

## 2023-04-19 DIAGNOSIS — I10 Essential (primary) hypertension: Secondary | ICD-10-CM | POA: Diagnosis not present

## 2023-04-19 DIAGNOSIS — E1140 Type 2 diabetes mellitus with diabetic neuropathy, unspecified: Secondary | ICD-10-CM | POA: Diagnosis not present

## 2023-04-19 DIAGNOSIS — Z79899 Other long term (current) drug therapy: Secondary | ICD-10-CM | POA: Diagnosis not present

## 2023-04-19 DIAGNOSIS — Z882 Allergy status to sulfonamides status: Secondary | ICD-10-CM

## 2023-04-19 DIAGNOSIS — G4730 Sleep apnea, unspecified: Secondary | ICD-10-CM | POA: Diagnosis not present

## 2023-04-19 LAB — DIFFERENTIAL
Abs Immature Granulocytes: 0.02 10*3/uL (ref 0.00–0.07)
Basophils Absolute: 0 10*3/uL (ref 0.0–0.1)
Basophils Relative: 1 %
Eosinophils Absolute: 0.1 10*3/uL (ref 0.0–0.5)
Eosinophils Relative: 2 %
Immature Granulocytes: 0 %
Lymphocytes Relative: 17 %
Lymphs Abs: 0.9 10*3/uL (ref 0.7–4.0)
Monocytes Absolute: 0.5 10*3/uL (ref 0.1–1.0)
Monocytes Relative: 9 %
Neutro Abs: 3.9 10*3/uL (ref 1.7–7.7)
Neutrophils Relative %: 71 %

## 2023-04-19 LAB — URINALYSIS, ROUTINE W REFLEX MICROSCOPIC
Bilirubin Urine: NEGATIVE
Glucose, UA: NEGATIVE mg/dL
Ketones, ur: NEGATIVE mg/dL
Nitrite: NEGATIVE
Protein, ur: NEGATIVE mg/dL
Specific Gravity, Urine: 1.01 (ref 1.005–1.030)
pH: 5.5 (ref 5.0–8.0)

## 2023-04-19 LAB — PROTIME-INR
INR: 1.2 (ref 0.8–1.2)
Prothrombin Time: 15 seconds (ref 11.4–15.2)

## 2023-04-19 LAB — COMPREHENSIVE METABOLIC PANEL
ALT: 23 U/L (ref 0–44)
AST: 35 U/L (ref 15–41)
Albumin: 4.1 g/dL (ref 3.5–5.0)
Alkaline Phosphatase: 81 U/L (ref 38–126)
Anion gap: 8 (ref 5–15)
BUN: 19 mg/dL (ref 8–23)
CO2: 25 mmol/L (ref 22–32)
Calcium: 8.9 mg/dL (ref 8.9–10.3)
Chloride: 103 mmol/L (ref 98–111)
Creatinine, Ser: 0.65 mg/dL (ref 0.44–1.00)
GFR, Estimated: >60 mL/min (ref >60)
Glucose, Bld: 220 mg/dL — ABNORMAL HIGH (ref 70–99)
Potassium: 3.4 mmol/L — ABNORMAL LOW (ref 3.5–5.1)
Sodium: 136 mmol/L (ref 135–145)
Total Bilirubin: 0.7 mg/dL (ref 0.3–1.2)
Total Protein: 7.1 g/dL (ref 6.5–8.1)

## 2023-04-19 LAB — ETHANOL: Alcohol, Ethyl (B): <10 mg/dL (ref <10)

## 2023-04-19 LAB — CBG MONITORING, ED: Glucose-Capillary: 224 mg/dL — ABNORMAL HIGH (ref 70–99)

## 2023-04-19 LAB — APTT: aPTT: 36 seconds (ref 24–36)

## 2023-04-19 LAB — CBC
HCT: 37.5 % (ref 36.0–46.0)
Hemoglobin: 11.7 g/dL — ABNORMAL LOW (ref 12.0–15.0)
MCH: 25.3 pg — ABNORMAL LOW (ref 26.0–34.0)
MCHC: 31.2 g/dL (ref 30.0–36.0)
MCV: 81.2 fL (ref 80.0–100.0)
Platelets: 166 10*3/uL (ref 150–400)
RBC: 4.62 MIL/uL (ref 3.87–5.11)
RDW: 14.4 % (ref 11.5–15.5)
WBC: 5.5 10*3/uL (ref 4.0–10.5)
nRBC: 0 % (ref 0.0–0.2)

## 2023-04-19 LAB — TSH: TSH: 2.62 u[IU]/mL (ref 0.350–4.500)

## 2023-04-19 LAB — AMMONIA: Ammonia: 49 umol/L — ABNORMAL HIGH (ref 9–35)

## 2023-04-19 MED ORDER — ONDANSETRON HCL 4 MG/2ML IJ SOLN: 4.0000 mg | Freq: Four times a day (QID) | INTRAMUSCULAR | Status: DC | PRN

## 2023-04-19 MED ORDER — METOPROLOL TARTRATE 25 MG PO TABS
25.0000 mg | ORAL_TABLET | Freq: Two times a day (BID) | ORAL | Status: DC
  Administered 2023-04-19 – 2023-04-20 (×2): 25 mg via ORAL
  Filled 2023-04-19 (×2): qty 1

## 2023-04-19 MED ORDER — ONDANSETRON HCL 4 MG PO TABS: 4.0000 mg | ORAL_TABLET | Freq: Four times a day (QID) | ORAL | Status: DC | PRN

## 2023-04-19 MED ORDER — LOSARTAN POTASSIUM 50 MG PO TABS
50.0000 mg | ORAL_TABLET | Freq: Every day | ORAL | Status: DC
  Administered 2023-04-20: 50 mg via ORAL
  Filled 2023-04-19: qty 1

## 2023-04-19 MED ORDER — RIFAXIMIN 550 MG PO TABS
550.0000 mg | ORAL_TABLET | Freq: Two times a day (BID) | ORAL | Status: DC
  Administered 2023-04-19 – 2023-04-20 (×2): 550 mg via ORAL
  Filled 2023-04-19 (×3): qty 1

## 2023-04-19 MED ORDER — HYDROCHLOROTHIAZIDE 25 MG PO TABS
25.0000 mg | ORAL_TABLET | Freq: Every day | ORAL | Status: DC
  Administered 2023-04-20: 25 mg via ORAL
  Filled 2023-04-19: qty 1

## 2023-04-19 MED ORDER — ENOXAPARIN SODIUM 40 MG/0.4ML IJ SOSY
40.0000 mg | PREFILLED_SYRINGE | INTRAMUSCULAR | Status: DC
  Administered 2023-04-19: 40 mg via SUBCUTANEOUS
  Filled 2023-04-19: qty 0.4

## 2023-04-19 MED ORDER — ROSUVASTATIN CALCIUM 10 MG PO TABS
10.0000 mg | ORAL_TABLET | Freq: Every day | ORAL | Status: DC
  Administered 2023-04-20: 10 mg via ORAL
  Filled 2023-04-19: qty 1

## 2023-04-19 MED ORDER — SODIUM CHLORIDE 0.9% FLUSH
3.0000 mL | Freq: Once | INTRAVENOUS | Status: AC
  Administered 2023-04-19: 3 mL via INTRAVENOUS

## 2023-04-19 MED ORDER — LACTULOSE 10 GM/15ML PO SOLN
20.0000 g | Freq: Once | ORAL | Status: AC
  Administered 2023-04-19: 20 g via ORAL
  Filled 2023-04-19: qty 30

## 2023-04-19 MED ORDER — PANTOPRAZOLE SODIUM 40 MG PO TBEC
40.0000 mg | DELAYED_RELEASE_TABLET | Freq: Every day | ORAL | Status: DC
  Administered 2023-04-20: 40 mg via ORAL
  Filled 2023-04-19: qty 1

## 2023-04-19 MED ORDER — IOHEXOL 350 MG/ML SOLN
75.0000 mL | Freq: Once | INTRAVENOUS | Status: AC | PRN
  Administered 2023-04-19: 75 mL via INTRAVENOUS

## 2023-04-19 MED ORDER — LACTULOSE 10 GM/15ML PO SOLN
30.0000 g | Freq: Three times a day (TID) | ORAL | Status: DC
  Administered 2023-04-19 – 2023-04-20 (×2): 30 g via ORAL
  Filled 2023-04-19 (×2): qty 60

## 2023-04-19 NOTE — ED Provider Notes (Signed)
[facility] Provider Note    Event Date/Time   First MD Initiated Contact with Patient 04/19/23 1331     (approximate)   History   Chief Complaint Altered Mental Status   HPI  Laura [name] is a 72 y.o. female with past medical history of hypertension, diabetes, nonalcoholic fatty liver disease, and cirrhosis who presents to the ED for altered mental status.  Daughter reports that patient has been dealing with intermittent confusion for about the past week that seemed relatively mild.  She was going to call patient's PCP to have her assessed for this, however her confusion seemed to get acutely worse earlier this morning.  Daughter states that patient was normal when she saw her around 6 PM last night, but she found her this morning laying in bed and difficult to arouse.  Patient currently reports a cough but denies any fevers, chest pain, shortness of breath, abdominal pain, nausea, vomiting, or dysuria.  Daughter denies any recent medication changes, and patient has not had issues with alcohol or drug use in the past per daughter.     Physical Exam   Triage Vital Signs: ED Triage Vitals  Enc Vitals Group     BP 04/19/23 1317 (!) 164/64     Pulse Rate 04/19/23 1317 81     Resp 04/19/23 1317 17     Temp 04/19/23 1317 98.1 F (36.7 C)     Temp Source 04/19/23 1317 Oral     SpO2 04/19/23 1317 98 %     Weight 04/19/23 1318 189 lb (85.7 kg)     Height --      Head Circumference --      Peak Flow --      Pain Score 04/19/23 1317 0     Pain Loc --      Pain Edu? --      Excl. in GC? --     Most recent vital signs: Vitals:   04/19/23 1400 04/19/23 1505  BP: (!) 152/67 (!) 147/64  Pulse: 77 75  Resp: 16 15  Temp:    SpO2: 100% 98%    Constitutional: Alert and oriented to person and place, but not time or situation. Eyes: Conjunctivae are normal. Head: Atraumatic. Nose: No congestion/rhinnorhea. Mouth/Throat: Mucous membranes are  moist. Cardiovascular: Normal rate, regular rhythm. Grossly normal heart sounds.  2+ radial pulses bilaterally. Respiratory: Normal respiratory effort.  No retractions. Lungs CTAB. Gastrointestinal: Soft and nontender. No distention. Musculoskeletal: No lower extremity tenderness nor edema.  Neurologic:  Normal speech and language. No gross focal neurologic deficits are appreciated.    ED Results / Procedures / Treatments   Labs (all labs ordered are listed, but only abnormal results are displayed) Labs Reviewed  CBC - Abnormal; Notable for the following components:      Result Value   Hemoglobin 11.7 (*)    MCH 25.3 (*)    All other components within normal limits  COMPREHENSIVE METABOLIC PANEL - Abnormal; Notable for the following components:   Potassium 3.4 (*)    Glucose, Bld 220 (*)    All other components within normal limits  AMMONIA - Abnormal; Notable for the following components:   Ammonia 49 (*)    All other components within normal limits  URINALYSIS, ROUTINE W REFLEX MICROSCOPIC - Abnormal; Notable for the following components:   Color, Urine YELLOW (*)    APPearance CLEAR (*)    Hgb urine dipstick TRACE (*)    Leukocytes,Ua  TRACE (*)    Bacteria, UA RARE (*)    All other components within normal limits  CBG MONITORING, ED - Abnormal; Notable for the following components:   Glucose-Capillary 224 (*)    All other components within normal limits  PROTIME-INR  APTT  DIFFERENTIAL  ETHANOL     EKG  ED ECG REPORT I, Chesley Noon, the attending physician, personally viewed and interpreted this ECG.   Date: 04/19/2023  EKG Time: 14:10  Rate: 80  Rhythm: normal sinus rhythm  Axis: Normal  Intervals:none  ST&T Change: None  RADIOLOGY CT head reviewed and interpreted by me with no hemorrhage or midline shift.  PROCEDURES:  Critical Care performed: No  Procedures   MEDICATIONS ORDERED IN ED: Medications  sodium chloride flush (NS) 0.9 % injection  3 mL (3 mLs Intravenous Given 04/19/23 1415)  iohexol (OMNIPAQUE) 350 MG/ML injection 75 mL (75 mLs Intravenous Contrast Given 04/19/23 1342)  lactulose (CHRONULAC) 10 GM/15ML solution 20 g (20 g Oral Given 04/19/23 1459)     IMPRESSION / MDM / ASSESSMENT AND PLAN / ED COURSE  I reviewed the triage vital signs and the nursing notes.                              72 y.o. female with past medical history of hypertension, diabetes, nonalcoholic fatty liver disease, and cirrhosis who presents to the ED for intermittent confusion for the past week, acutely worse since last night.  Patient's presentation is most consistent with acute presentation with potential threat to life or bodily function.  Differential diagnosis includes, but is not limited to, stroke, TIA, hepatic encephalopathy, UTI, pneumonia, electrolyte abnormality, AKI, medication effect.  Patient nontoxic-appearing and in no acute distress, vital signs are unremarkable.  She is alert but only oriented to person and place, no focal neurologic deficits noted on exam.  Code stroke was initially activated from triage, however patient evaluated by Dr. Selina Cooley from neurology and low suspicion for stroke at this time.  CT head as well as CTA of head and neck are negative for acute process.  Labs without significant anemia, leukocytosis, electrolyte abnormality, or AKI.  LFTs are unremarkable but patient does have mild elevation in ammonia and presentation could be due to hepatic encephalopathy.  We will treat with lactulose, further assess for infectious process with chest x-ray and urinalysis.  Patient will require admission to the hospital for further management of altered mental status.  Chest x-ray is unremarkable and urinalysis shows no signs of infection.  Case discussed with hospitalist for admission.     FINAL CLINICAL IMPRESSION(S) / ED DIAGNOSES   Final diagnoses:  Altered mental status, unspecified altered mental status type   Hepatic encephalopathy (HCC)     Rx / DC Orders   ED Discharge Orders     None        Note:  This document was prepared using Dragon voice recognition software and may include unintentional dictation errors.   Chesley Noon, MD 04/19/23 [phone]

## 2023-04-19 NOTE — Assessment & Plan Note (Signed)
PPI ?

## 2023-04-19 NOTE — Consult Note (Signed)
NEUROLOGY CONSULTATION NOTE   Date of service: Apr 19, 2023 Patient Name: Laura [name] MRN:  [id number] DOB:  Nov 30, 1951 Reason for consult: code stroke Requesting physician: Dr, Chesley Noon _ _ _   _ __   _ __ _ _  __ __   _ __   __ _  History of Present Illness   This is a 72 yo woman with pmhx HTN, DM2, NAFLD with cirrhosis who presented with AMS fluctuating over past week. Yesterday at 1800 LKW she seemed like her usual self but today she was difficult to arouse and oriented only x2. She reported mild dizziness but this is not unusual for her. Exam nonfocal. NIHSS = 3. CT head unremarkable personal review. Ammonia 49. CTA performed after the stroke code showed no hemodynamically-significant stenosis.   ROS   Per HPI: all other systems reviewed and are negative  Past History   I have reviewed the following:  Past Medical History:  Diagnosis Date   Allergy    Anxiety    Asthma    Chronic back pain    Chronic cough 07/01/10   PFT  FEV1 2.20 (93%), FEV 1% 81, TLC 4,12 (81%), DLCO 78%, no BD. 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   Depression    Diabetes mellitus type 2, uncomplicated (HCC)    Diabetes mellitus without complication (HCC)    Diabetic neuropathy (HCC)    Diverticulosis    Dysrhythmia    H/O PALPITATIONS-SINCE ABLATION IN 2013 AT [facility], PALPITATIONS MUCH BETTER   GERD (gastroesophageal reflux disease)    NO MEDS   Hemorrhoids    HTN (hypertension)    NAFLD (nonalcoholic fatty liver disease) 78/29/5621   Neuropathy    Osteoarthritis    Sleep apnea    NO CPAP   Past Surgical History:  Procedure Laterality Date   BRONCHOSCOPY  07/25/10   CARDIAC ELECTROPHYSIOLOGY STUDY AND ABLATION     CARPAL TUNNEL RELEASE     CATARACT EXTRACTION Bilateral Jan 2017   cathater ablation     for arrhythmia   COLONOSCOPY  2014   COLONOSCOPY WITH PROPOFOL N/A 09/07/2019   Procedure: COLONOSCOPY WITH PROPOFOL;  Surgeon: Toledo,  Boykin Nearing, MD;  Location: [facility] ENDOSCOPY;  Service: Gastroenterology;  Laterality: N/A;   ESOPHAGOGASTRODUODENOSCOPY (EGD) WITH PROPOFOL N/A 07/05/2018   Procedure: ESOPHAGOGASTRODUODENOSCOPY (EGD) WITH PROPOFOL;  Surgeon: Scot Jun, MD;  Location: [facility]COPY;  Service: Endoscopy;  Laterality: N/A;   FOOT SURGERY     HEMORROIDECTOMY     LIPOMA EXCISION Right 10/20/2016   Procedure: EXCISION LIPOMA;  Surgeon: Kieth Brightly, MD;  Location: [facility] ORS;  Service: General;  Laterality: Right;   PARTIAL HYSTERECTOMY     TOENAIL EXCISION Right    Family History  Problem Relation Age of Onset   Cancer Mother        stomach   Diabetes Mother    Cancer Father        brain   Heart disease Father    Cancer Brother        mouth   Heart disease Brother    Ovarian cancer Maternal Grandmother    Social History   Socioeconomic History   Marital status: Widowed    Spouse name: Not on file   Number of children: Not on file   Years of education: Not on file   Highest education level: Not on file  Occupational History   Not on file  Tobacco Use   Smoking status: Never   Smokeless tobacco: Never  Vaping Use   Vaping Use: Never used  Substance and Sexual Activity   Alcohol use: No    Alcohol/week: 0.0 standard drinks of alcohol   Drug use: No   Sexual activity: Not on file  Other Topics Concern   Not on file  Social History Narrative   Married, 2 children. Works in day care center   Cell: [phone]    Social Determinants of Health   Financial Resource Strain: Not on file  Food Insecurity: Not on file  Transportation Needs: Not on file  Physical Activity: Not on file  Stress: Not on file  Social Connections: Not on file   Allergies  Allergen Reactions   Atenolol Palpitations   Clarithromycin Other (See Comments) and Nausea And Vomiting    Makes the tongue raw REACTION: nausea   Doxycycline Palpitations    Makes her heart beat fast Other reaction(s): GI  Upset (intolerance) Loose bowels REACTION: loose stool   Sulfa Antibiotics Nausea Only   Codeine Itching   Erythromycin Base Nausea And Vomiting   Prednisone Other (See Comments)    Mood swings   Penicillins Rash    Has patient had a PCN reaction causing immediate rash, facial/tongue/throat swelling, SOB or lightheadedness with hypotension: Yes Has patient had a PCN reaction causing severe rash involving mucus membranes or skin necrosis: No Has patient had a PCN reaction that required hospitalization No Has patient had a PCN reaction occurring within the last 10 years: No If all of the above answers are "NO", then may proceed with Cephalosporin use.  REACTION: rash     Medications   (Not in a hospital admission)    No current facility-administered medications for this encounter.  Current Outpatient Medications:    amLODipine (NORVASC) 5 MG tablet, Take 5 mg by mouth daily., Disp: , Rfl:    cephALEXin (KEFLEX) 500 MG capsule, Take 1 capsule (500 mg total) by mouth 3 (three) times daily., Disp: 21 capsule, Rfl: 0   Cholecalciferol (VITAMIN D3 PO), Take 1 tablet by mouth daily. (Patient not taking: Reported on 04/27/2021), Disp: , Rfl:    clonazePAM (KLONOPIN) 1 MG tablet, Take 1 tablet by mouth 2 (two) times daily., Disp: , Rfl:    ferrous sulfate 325 (65 FE) MG tablet, Take 1 tablet (325 mg total) by mouth daily with breakfast., Disp: 30 tablet, Rfl: 2   fluticasone (FLONASE) 50 MCG/ACT nasal spray, Place 2 sprays into the nose daily., Disp: , Rfl:    gabapentin (NEURONTIN) 300 MG capsule, TAKE 2 CAPSULES BY MOUTH 4 TIMES DAILY, Disp: 240 capsule, Rfl: 0   glipiZIDE (GLUCOTROL XL) 5 MG 24 hr tablet, Take 5 mg by mouth 2 (two) times daily. , Disp: , Rfl:    hydrochlorothiazide (HYDRODIURIL) 25 MG tablet, Take 25 mg by mouth daily., Disp: , Rfl:    insulin glargine (LANTUS) 100 UNIT/ML Solostar Pen, Inject 10 Units into the skin daily., Disp: 9 mL, Rfl: 0   losartan (COZAAR) 50 MG  tablet, Take 50 mg by mouth daily., Disp: , Rfl:    metFORMIN (GLUCOPHAGE) 500 MG tablet, Take 500 mg by mouth 2 (two) times daily., Disp: , Rfl:    metFORMIN (GLUCOPHAGE-XR) 500 MG 24 hr tablet, Take 1,000 mg by mouth 2 (two) times daily., Disp: , Rfl:    metoprolol tartrate (LOPRESSOR) 25 MG tablet, Take 25 mg by mouth 2 (two) times daily., Disp: , Rfl:  pantoprazole (PROTONIX) 40 MG tablet, Take 1 tablet (40 mg total) by mouth daily., Disp: 30 tablet, Rfl: 2   pantoprazole (PROTONIX) 40 MG tablet, Take 1 tablet by mouth daily., Disp: , Rfl:    rosuvastatin (CRESTOR) 10 MG tablet, Take 10 mg by mouth daily., Disp: , Rfl:    traZODone (DESYREL) 50 MG tablet, Take 2 tablets by mouth at bedtime., Disp: , Rfl:    venlafaxine XR (EFFEXOR-XR) 150 MG 24 hr capsule, Take 150 mg by mouth daily., Disp: , Rfl:   Vitals   Vitals:   04/19/23 1317 04/19/23 1318  BP: (!) 164/64   Pulse: 81   Resp: 17   Temp: 98.1 F (36.7 C)   TempSrc: Oral   SpO2: 98%   Weight:  85.7 kg     Body mass index is 31.45 kg/m.  Physical Exam   Physical Exam Gen: oriented x2, NAD HEENT: Atraumatic, normocephalic;mucous membranes moist; oropharynx clear, tongue without atrophy or fasciculations. Neck: Supple, trachea midline. Resp: CTAB, no w/r/r CV: RRR, no m/g/r; nml S1 and S2. 2+ symmetric peripheral pulses. Abd: soft/NT/ND; nabs x 4 quad Extrem: Nml bulk; no cyanosis, clubbing, or edema.  Neuro: *MS: oriented x2, follows most simple commands *Speech: fluid, minimal dysarthria, minimally impaired naming and repetition *CN:    I: Deferred   II,III: PERRLA, VFF by confrontation, optic discs unable to be visualized 2/2 pupillary constriction   III,IV,VI: EOMI w/o nystagmus, no ptosis   V: Sensation intact from V1 to V3 to LT   VII: Eyelid closure was full.  Smile symmetric.   VIII: Hearing intact to voice   IX,X: Voice normal, palate elevates symmetrically    XI: SCM/trap 5/5 bilat   XII: Tongue  protrudes midline, no atrophy or fasciculations  *Motor:   Normal bulk.  No tremor, rigidity or bradykinesia. No pronator drift. No drift in any extremity. *Sensory: SILT. Symmetric. Propioception intact bilat.  No double-simultaneous extinction.  *Coordination:  UTA 2/2 confusion *Reflexes:  2+ and symmetric throughout without clonus; toes down-going bilat *Gait: deferred  NIHSS  1a Level of Conscious.: 0 1b LOC Questions: 1 1c LOC Commands: 0 2 Best Gaze: 0 3 Visual: 0 4 Facial Palsy: 0 5a Motor Arm - left: 0 5b Motor Arm - Right: 0 6a Motor Leg - Left: 0 6b Motor Leg - Right: 0 7 Limb Ataxia: 0 8 Sensory: 0 9 Best Language: 1 10 Dysarthria: 1 11 Extinct. and Inatten.: 0  TOTAL: 3  Premorbid mRS = 1   Labs   CBC:  Recent Labs  Lab 04/19/23 1328  WBC 5.5  NEUTROABS 3.9  HGB 11.7*  HCT 37.5  MCV 81.2  PLT 166    Basic Metabolic Panel:  Lab Results  Component Value Date   NA 136 04/19/2023   K 3.4 (L) 04/19/2023   CO2 25 04/19/2023   GLUCOSE 220 (H) 04/19/2023   BUN 19 04/19/2023   CREATININE 0.65 04/19/2023   CALCIUM 8.9 04/19/2023   GFRNONAA >60 04/19/2023   GFRAA >60 07/26/2019   Lipid Panel: No results found for: "LDLCALC" HgbA1c:  Lab Results  Component Value Date   HGBA1C 11.9 (H) 04/27/2021   Urine Drug Screen: No results found for: "LABOPIA", "COCAINSCRNUR", "LABBENZ", "AMPHETMU", "THCU", "LABBARB"  Alcohol Level     Component Value Date/Time   ETH <10 04/19/2023 1328    CT Head without contrast: unremarkable  CT angio Head and Neck with contrast: No hemodynamically-significant stenosis   Impression  This is a 72 yo woman with pmhx HTN, DM2, NAFLD with cirrhosis who presented with AMS fluctuating over past week. Stroke code was called but exam was nonfocal and stroke code was cancelled. CT head unremarkable personal review. Ammonia 49. Suspect sx 2/2 AMS not stroke.  Recommendations   - Further metabolic/infectious workup per  ED and admitting team - Lactulose for hyperammonemia - No further stroke workup, neurology to sign off but please reconsult if new neurologic concerns arise. ______________________________________________________________________   Thank you for the opportunity to take part in the care of this patient. If you have any further questions, please contact the neurology consultation attending.  Signed,  Bing Neighbors, MD [facility] [phone]  If 7pm- 7am, please page neurology on call as listed in AMION.  **Any copied and pasted documentation in this note was written by me in another application not billed for and pasted by me into this document.

## 2023-04-19 NOTE — Assessment & Plan Note (Signed)
BP stable Titrate home regimen 

## 2023-04-19 NOTE — ED Triage Notes (Signed)
Pt is A/Ox2 at this time. Pt daughter sts that pt has been very altered today. Daughter noticed that pt has become more altered today. Daughter sts that last well is around 1800 last night. Pt sts that she has been really dizzy and weak.

## 2023-04-19 NOTE — H&P (Signed)
History and Physical    Patient: Laura [name] ZOX:[id number] DOB: Aug 09, 1951 DOA: 04/19/2023 DOS: the patient was seen and examined on 04/19/2023 PCP: Jerl Mina, MD  Patient coming from: Home  Chief Complaint:  Chief Complaint  Patient presents with   Altered Mental Status   HPI: Laura [name] is a 72 y.o. female with medical history significant of type 2 diabetes, Elita Boone cirrhosis, diabetic neuropathy, GERD, hypertension presenting with encephalopathy.  History primarily from patient's daughter who is at the bedside.  Per report, patient with worsening function decline over the past 2 weeks.  Patient will significantly worsening confusion with patient being unaware of person place time orientation.  Family states that she has had somewhat of a mild decline over multiple months.  Noted evaluation within the [facility] system back in February of this year with concern for cognitive parent as well as pseudodementia with depression.  No recent medication changes per the family.  No reported HI/SI.  No alcohol or tobacco use reported.  Has had some generalized weakness as well as falls at home.  No reported slurred speech or confusion.  No known history of stroke in the past. Presented to the ER afebrile, hemodynamically stable.  Satting well on room air.  White count 5.5, hemoglobin 9.7, platelets 166.  Ammonia level 49.  INR 1.2.  Creatinine 0.65, glucose 220.  Code stroke initially called with CT head as well as CT angio of the head and neck grossly stable.  Code stroke called off after formal neurology evaluation. Review of Systems: As mentioned in the history of present illness. All other systems reviewed and are negative. Past Medical History:  Diagnosis Date   Allergy    Anxiety    Asthma    Chronic back pain    Chronic cough 07/01/10   PFT  FEV1 2.20 (93%), FEV 1% 81, TLC 4,12 (81%), DLCO 78%, no BD. 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   Depression     Diabetes mellitus type 2, uncomplicated (HCC)    Diabetes mellitus without complication (HCC)    Diabetic neuropathy (HCC)    Diverticulosis    Dysrhythmia    H/O PALPITATIONS-SINCE ABLATION IN 2013 AT [facility], PALPITATIONS MUCH BETTER   GERD (gastroesophageal reflux disease)    NO MEDS   Hemorrhoids    HTN (hypertension)    NAFLD (nonalcoholic fatty liver disease) 81/19/1478   Neuropathy    Osteoarthritis    Sleep apnea    NO CPAP   Past Surgical History:  Procedure Laterality Date   BRONCHOSCOPY  07/25/10   CARDIAC ELECTROPHYSIOLOGY STUDY AND ABLATION     CARPAL TUNNEL RELEASE     CATARACT EXTRACTION Bilateral Jan 2017   cathater ablation     for arrhythmia   COLONOSCOPY  2014   COLONOSCOPY WITH PROPOFOL N/A 09/07/2019   Procedure: COLONOSCOPY WITH PROPOFOL;  Surgeon: Toledo, Boykin Nearing, MD;  Location: [facility] ENDOSCOPY;  Service: Gastroenterology;  Laterality: N/A;   ESOPHAGOGASTRODUODENOSCOPY (EGD) WITH PROPOFOL N/A 07/05/2018   Procedure: ESOPHAGOGASTRODUODENOSCOPY (EGD) WITH PROPOFOL;  Surgeon: Scot Jun, MD;  Location: [facility] END[facility]COPY;  Service: Endoscopy;  Laterality: N/A;   FOOT SURGERY     HEMORROIDECTOMY     LIPOMA EXCISION Right 10/20/2016   Procedure: EXCISION LIPOMA;  Surgeon: Kieth Brightly, MD;  Location: [facility] ORS;  Service: General;  Laterality: Right;   PARTIAL HYSTERECTOMY     TOENAIL EXCISION Right    Social History:  reports  that she has never smoked. She has never used smokeless tobacco. She reports that she does not drink alcohol and does not use drugs.  Allergies  Allergen Reactions   Atenolol Palpitations   Clarithromycin Other (See Comments) and Nausea And Vomiting    Makes the tongue raw REACTION: nausea   Doxycycline Palpitations    Makes her heart beat fast Other reaction(s): GI Upset (intolerance) Loose bowels REACTION: loose stool   Sulfa Antibiotics Nausea Only   Codeine Itching   Erythromycin Base Nausea And Vomiting    Prednisone Other (See Comments)    Mood swings   Penicillins Rash    Has patient had a PCN reaction causing immediate rash, facial/tongue/throat swelling, SOB or lightheadedness with hypotension: Yes Has patient had a PCN reaction causing severe rash involving mucus membranes or skin necrosis: No Has patient had a PCN reaction that required hospitalization No Has patient had a PCN reaction occurring within the last 10 years: No If all of the above answers are "NO", then may proceed with Cephalosporin use.  REACTION: rash     Family History  Problem Relation Age of Onset   Cancer Mother        stomach   Diabetes Mother    Cancer Father        brain   Heart disease Father    Cancer Brother        mouth   Heart disease Brother    Ovarian cancer Maternal Grandmother     Prior to Admission medications   Medication Sig Start Date End Date Taking? Authorizing Provider  benzonatate (TESSALON) 200 MG capsule Take 200 mg by mouth 3 (three) times daily as needed for cough. 03/23/23  Yes [provider]  clonazePAM (KLONOPIN) 1 MG tablet Take 1 mg by mouth 2 (two) times daily. 07/02/20  Yes [provider]  ferrous sulfate 325 (65 FE) MG tablet Take 1 tablet (325 mg total) by mouth daily with breakfast. 05/01/21 04/19/23 Yes Dahal, Melina Schools, MD  gabapentin (NEURONTIN) 300 MG capsule TAKE 2 CAPSULES BY MOUTH 4 TIMES DAILY Patient taking differently: Take 600 mg by mouth 4 (four) times daily. TAKE 3 CAPSULES BY MOUTH TWICE TIMES DAILY 01/02/21  Yes Masoud, Renda Rolls, MD  glipiZIDE (GLUCOTROL XL) 5 MG 24 hr tablet Take 5-10 mg by mouth 2 (two) times daily. Take 10 mg every morning and 5 mg every evening 01/29/16  Yes [provider]  hydrochlorothiazide (HYDRODIURIL) 25 MG tablet Take 25 mg by mouth daily. 11/13/20  Yes [provider]  losartan (COZAAR) 50 MG tablet Take 50 mg by mouth daily. 12/13/20  Yes [provider]  metFORMIN (GLUCOPHAGE) 500 MG tablet Take  500 mg by mouth 2 (two) times daily. 12/15/20  Yes [provider]  metoprolol tartrate (LOPRESSOR) 25 MG tablet Take 25 mg by mouth 2 (two) times daily. 11/20/20  Yes [provider]  NOVOLOG FLEXPEN 100 UNIT/ML FlexPen Inject 0-60 Units into the skin 3 (three) times daily with meals. 60 units every morning and Sliding Scale for lunch and supper (14-22) 02/10/23  Yes [provider]  pantoprazole (PROTONIX) 40 MG tablet Take 1 tablet (40 mg total) by mouth daily. 04/30/21 04/19/23 Yes Dahal, Melina Schools, MD  rosuvastatin (CRESTOR) 10 MG tablet Take 10 mg by mouth daily. 11/17/20  Yes [provider]  venlafaxine XR (EFFEXOR-XR) 150 MG 24 hr capsule Take 150 mg by mouth daily. 08/20/21  Yes [provider]  amLODipine (NORVASC) 5 MG tablet  Take 5 mg by mouth daily. Patient not taking: Reported on 04/19/2023 12/26/20   [provider]  cephALEXin (KEFLEX) 500 MG capsule Take 1 capsule (500 mg total) by mouth 3 (three) times daily. Patient not taking: Reported on 04/19/2023 08/30/21   Minna Antis, MD  Cholecalciferol (VITAMIN D3 PO) Take 1 tablet by mouth daily. Patient not taking: Reported on 04/27/2021    [provider]  fluticasone (FLONASE) 50 MCG/ACT nasal spray Place 2 sprays into the nose daily. Patient not taking: Reported on 04/19/2023    [provider]  metFORMIN (GLUCOPHAGE-XR) 500 MG 24 hr tablet Take 1,000 mg by mouth 2 (two) times daily. Patient not taking: Reported on 04/19/2023 07/17/21   [provider]  pantoprazole (PROTONIX) 40 MG tablet Take 40 mg by mouth daily. Patient not taking: Reported on 04/19/2023 07/29/21   [provider]  traZODone (DESYREL) 50 MG tablet Take 2 tablets by mouth at bedtime. Patient not taking: Reported on 04/19/2023 01/01/21   [provider]    Physical Exam: Vitals:   04/19/23 1317 04/19/23 1318 04/19/23 1400 04/19/23 1505  BP: (!) 164/64  (!) 152/67 (!) 147/64   Pulse: 81  77 75  Resp: 17  16 15   Temp: 98.1 F (36.7 C)     TempSrc: Oral     SpO2: 98%  100% 98%  Weight:  85.7 kg     Physical Exam Constitutional:      Appearance: She is normal weight.  HENT:     Head: Normocephalic and atraumatic.     Nose: Nose normal.     Mouth/Throat:     Mouth: Mucous membranes are moist.  Eyes:     Pupils: Pupils are equal, round, and reactive to light.  Cardiovascular:     Rate and Rhythm: Normal rate.     Pulses: Normal pulses.     Heart sounds: Normal heart sounds.  Pulmonary:     Effort: Pulmonary effort is normal.  Abdominal:     General: Abdomen is flat. Bowel sounds are normal.  Musculoskeletal:        General: Normal range of motion.     Cervical back: Normal range of motion.  Skin:    General: Skin is warm.  Neurological:     General: No focal deficit present.     Comments: Positive generalized confusion  Psychiatric:        Mood and Affect: Mood normal.     Data Reviewed:  There are no new results to review at this time. DG Chest 2 View CLINICAL DATA:  Cough, weakness.  EXAM: CHEST - 2 VIEW  COMPARISON:  Chest radiograph 07/28/2022, 12/08/2021; CT chest 04/11/2022  FINDINGS: The heart size and mediastinal contours are within normal limits. Both lungs are clear. No pleural effusion or pneumothorax. Multilevel degenerative changes in the mid to distal thoracic spine.  IMPRESSION: No active cardiopulmonary disease.  Electronically Signed   By: Sherron Ales M.D.   On: 04/19/2023 15:04 CT ANGIO HEAD NECK W WO CM CLINICAL DATA:  Provided history: Neuro deficit, acute, stroke suspected.  EXAM: CT ANGIOGRAPHY HEAD AND NECK WITH AND WITHOUT CONTRAST  TECHNIQUE: Multidetector CT imaging of the head and neck was performed using the standard protocol during bolus administration of intravenous contrast. Multiplanar CT image reconstructions and MIPs were obtained to evaluate the vascular anatomy. Carotid  stenosis measurements (when applicable) are obtained utilizing NASCET criteria, using the distal internal carotid diameter as the denominator.  RADIATION DOSE  REDUCTION: This exam was performed according to the departmental dose-optimization program which includes automated exposure control, adjustment of the mA and/or kV according to patient size and/or use of iterative reconstruction technique.  CONTRAST:  75mL OMNIPAQUE IOHEXOL 350 MG/ML SOLN  COMPARISON:  Noncontrast head CT performed earlier today 04/19/2023.  FINDINGS: CTA NECK FINDINGS  Aortic arch: Standard aortic branching. Atherosclerotic plaque within the visualized aortic arch and proximal major branch vessels of the neck. Streak and beam hardening artifact arising from a dense right-sided contrast bolus partially obscures the right subclavian artery. Within this limitation, there is no appreciable hemodynamically significant innominate or proximal subclavian artery stenosis.  Right carotid system: CCA and ICA patent within the neck without stenosis or significant atherosclerotic disease.  Left carotid system: The CCA and ICA patent within the neck without stenosis. Mild atherosclerotic plaque at the CCA origin and about the carotid bifurcation.  Vertebral arteries: Vertebral arteries codominant and patent within the neck without stenosis or significant atherosclerotic disease.  Skeleton: No acute fracture or aggressive osseous lesion.  Other neck: No neck mass or cervical lymphadenopathy.  Upper chest: No consolidation within the imaged lung apices.  Review of the MIP images confirms the above findings  CTA HEAD FINDINGS  Anterior circulation:  The intracranial internal carotid arteries are patent. Atherosclerotic plaque within both vessels with no more than mild stenosis. The M1 middle cerebral arteries are patent. No M2 proximal branch occlusion or high-grade proximal stenosis. The anterior cerebral  arteries are patent. No intracranial aneurysm is identified.  Posterior circulation:  The intracranial vertebral arteries are patent. The basilar artery is patent. The posterior cerebral arteries are patent. Posterior communicating arteries are diminutive or absent bilaterally.  Venous sinuses: Within the limitations of contrast timing, no convincing thrombus.  Anatomic variants: As described  Review of the MIP images confirms the above findings  No emergent large vessel occlusion identified. These results were called by telephone at the time of interpretation on 04/19/2023 at 2:08 pm to provider Dr. Larinda Buttery, who verbally acknowledged these results.  IMPRESSION: CTA neck:  1. The common carotid, internal carotid and vertebral arteries are patent within the neck without stenosis. Mild atherosclerotic plaque within the left common carotid and cervical internal carotid arteries as described. 2.  Aortic Atherosclerosis (ICD10-I70.0).  CTA head:  1. No intracranial large vessel occlusion or proximal high-grade arterial stenosis identified. 2. Atherosclerotic plaque within the intracranial internal carotid arteries with no more than mild stenosis.  Electronically Signed   By: Jackey Loge D.O.   On: 04/19/2023 14:11 CT HEAD CODE STROKE WO CONTRAST CLINICAL DATA:  Code stroke. Neuro deficit, acute, stroke suspected.  EXAM: CT HEAD WITHOUT CONTRAST  TECHNIQUE: Contiguous axial images were obtained from the base of the skull through the vertex without intravenous contrast.  RADIATION DOSE REDUCTION: This exam was performed according to the departmental dose-optimization program which includes automated exposure control, adjustment of the mA and/or kV according to patient size and/or use of iterative reconstruction technique.  COMPARISON:  Brain MRI 11/13/2022.  Head CT 07/29/2019  FINDINGS: Brain:  No age advanced or lobar predominant parenchymal atrophy.  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  There is no acute intracranial hemorrhage.  No demarcated cortical infarct.  No extra-axial fluid collection.  No midline shift.  Vascular:  No hyperdense vessel.  Atherosclerotic calcifications.  Skull: No fracture or aggressive osseous lesion.  Sinuses/Orbits: No mass or acute finding within the imaged orbits. Minimal mucosal thickening within the bilateral maxillary sinuses, and within bilateral anterior ethmoid air cells.  ASPECTS [facility] Stroke Program Early CT Score)  - Ganglionic level infarction (caudate, lentiform nuclei, internal capsule, insula, M1-M3 cortex): 7  - Supraganglionic infarction (M4-M6 cortex): 3  Total score (0-10 with 10 being normal): 10  No evidence of an acute intracranial abnormality. These results were called by telephone at the time of interpretation on 04/19/2023 at 1:42 pm to provider [facility] , who verbally acknowledged these results.  IMPRESSION: 1. No evidence of an acute intracranial abnormality. 2. 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 Please refer to this prior examination for further description. 3. 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 Please refer to the prior brain MRI.  Electronically Signed   By: Jackey Loge D.O.   On: 04/19/2023 13:44  Lab Results  Component Value Date   WBC 5.5 04/19/2023   HGB 11.7 (L) 04/19/2023   HCT 37.5 04/19/2023   MCV 81.2 04/19/2023   PLT 166 04/19/2023   Last metabolic panel Lab Results  Component Value Date   GLUCOSE 220 (H) 04/19/2023   NA 136 04/19/2023   K 3.4 (L) 04/19/2023   CL 103 04/19/2023   CO2 25 04/19/2023    BUN 19 04/19/2023   CREATININE 0.65 04/19/2023   GFRNONAA >60 04/19/2023   CALCIUM 8.9 04/19/2023   PHOS 4.0 04/29/2021   PROT 7.1 04/19/2023   ALBUMIN 4.1 04/19/2023   BILITOT 0.7 04/19/2023   ALKPHOS 81 04/19/2023   AST 35 04/19/2023   ALT 23 04/19/2023   ANIONGAP 8 04/19/2023    Assessment and Plan: * Encephalopathy + generalized confusion, disorientation x 2 weeks in setting of NASH cirrhosis  Nonfocal neuro exam w/ generalized confusion S/p code stroke eval  CT head, CTA head and neck grossly stable  Ammonia level 49  Will start on lactulose  Suspect component on baseline dementia  Hold offending medications including neurontin, klonopin  Follow up neurology recommendations    NASH (nonalcoholic steatohepatitis) Baseline non-alcoholic cirrhosis w/ elevated ammonia level  LFTs stable  Monitor   Uncontrolled type 2 diabetes mellitus with hyperglycemia (HCC) SSI  A1C   BP (high blood pressure) BP stable  Titrate home regimen    Diabetic neuropathy (HCC) Hold neurontin in setting of encephalopathy    GERD PPI      Advance Care Planning:   Code Status: Full Code   Consults: neurology w/ Dr. Selina Cooley   Family Communication: Daughter and daughters boyfriend at the bedside   Severity of Illness: The appropriate patient status for this patient is OBSERVATION. Observation status is judged to be reasonable and necessary in order to provide the required intensity of service to ensure the patient's safety. The patient's presenting symptoms, physical exam findings, and initial radiographic and laboratory data in the context of their medical condition is felt to place them at decreased risk for further clinical deterioration. Furthermore, it is anticipated that the patient will be medically stable for discharge from the hospital within 2 midnights of admission.   Author: Floydene Flock, MD 04/19/2023 4:35 PM  For on call review [email or web address].

## 2023-04-19 NOTE — ED Notes (Signed)
Code CVA cancelled

## 2023-04-19 NOTE — Progress Notes (Signed)
Telestroke Note   1331: Code stroke cart activated at this time. Patient already in CT at time of cart activation. Patient arrived to the ED with complaints of aphasia, dizziness, and generalized weakness per nursing staff. LKW 5/11 at 1800. mRS 1.  1333: Dr. Selina Cooley paged at this time.   1334: Dr. Selina Cooley arrived to CT department at this time. Dr. Selina Cooley reviewing CT imaging at this time.   1335: Dr. Selina Cooley in CT performing neuro assessment at this time and speaking with patient.   1339: Per Dr. Selina Cooley, NCCT imaging negative for LVO. CTA imaging ordered by Dr.Stack at this time.    Derrill Kay Telestroke RN

## 2023-04-19 NOTE — Assessment & Plan Note (Signed)
SSI  A1C  

## 2023-04-19 NOTE — Assessment & Plan Note (Signed)
Baseline non-alcoholic cirrhosis w/ elevated ammonia level  LFTs stable  Monitor

## 2023-04-19 NOTE — Assessment & Plan Note (Addendum)
+   generalized confusion, disorientation x 2 weeks in setting of NASH cirrhosis  Nonfocal neuro exam w/ generalized confusion S/p code stroke eval  CT head, CTA head and neck grossly stable  Ammonia level 49  Will start on lactulose  Suspect component on baseline dementia  Hold offending medications including neurontin, klonopin  Follow up neurology recommendations

## 2023-04-19 NOTE — Assessment & Plan Note (Signed)
Hold neurontin in setting of encephalopathy

## 2023-04-19 NOTE — Consult Note (Signed)
CODE STROKE- PHARMACY COMMUNICATION   Time CODE STROKE called/page received:1334  Time response to CODE STROKE was made (in person or via phone):   Time Stroke Kit retrieved from [employer] (only if needed): n/a *Outside window for TNK*  Name of Provider/Nurse contacted:Dr Andree Elk  Past Medical History:  Diagnosis Date   Allergy    Anxiety    Asthma    Chronic back pain    Chronic cough 07/01/10   PFT  FEV1 2.20 (93%), FEV 1% 81, TLC 4,12 (81%), DLCO 78%, no BD. 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   Depression    Diabetes mellitus type 2, uncomplicated (HCC)    Diabetes mellitus without complication (HCC)    Diabetic neuropathy (HCC)    Diverticulosis    Dysrhythmia    H/O PALPITATIONS-SINCE ABLATION IN 2013 AT [facility], PALPITATIONS MUCH BETTER   GERD (gastroesophageal reflux disease)    NO MEDS   Hemorrhoids    HTN (hypertension)    NAFLD (nonalcoholic fatty liver disease) 82/95/6213   Neuropathy    Osteoarthritis    Sleep apnea    NO CPAP   Prior to Admission medications   Medication Sig Start Date End Date Taking? Authorizing Provider  amLODipine (NORVASC) 5 MG tablet Take 5 mg by mouth daily. 12/26/20   [provider]  cephALEXin (KEFLEX) 500 MG capsule Take 1 capsule (500 mg total) by mouth 3 (three) times daily. 08/30/21   Minna Antis, MD  Cholecalciferol (VITAMIN D3 PO) Take 1 tablet by mouth daily. Patient not taking: Reported on 04/27/2021    [provider]  clonazePAM (KLONOPIN) 1 MG tablet Take 1 tablet by mouth 2 (two) times daily. 07/02/20   [provider]  ferrous sulfate 325 (65 FE) MG tablet Take 1 tablet (325 mg total) by mouth daily with breakfast. 05/01/21 07/30/21  Dahal, Melina Schools, MD  fluticasone (FLONASE) 50 MCG/ACT nasal spray Place 2 sprays into the nose daily.    [provider]  gabapentin (NEURONTIN) 300 MG capsule TAKE 2 CAPSULES BY MOUTH 4 TIMES DAILY 01/02/21   Corky Downs, MD  glipiZIDE (GLUCOTROL XL) 5 MG 24 hr tablet Take 5 mg by mouth 2 (two) times daily.  01/29/16   [provider]  hydrochlorothiazide (HYDRODIURIL) 25 MG tablet Take 25 mg by mouth daily. 11/13/20   [provider]  insulin glargine (LANTUS) 100 UNIT/ML Solostar Pen Inject 10 Units into the skin daily. 04/30/21 07/29/21  Lorin Glass, MD  losartan (COZAAR) 50 MG tablet Take 50 mg by mouth daily. 12/13/20   [provider]  metFORMIN (GLUCOPHAGE) 500 MG tablet Take 500 mg by mouth 2 (two) times daily. 12/15/20   [provider]  metFORMIN (GLUCOPHAGE-XR) 500 MG 24 hr tablet Take 1,000 mg by mouth 2 (two) times daily. 07/17/21   [provider]  metoprolol tartrate (LOPRESSOR) 25 MG tablet Take 25 mg by mouth 2 (two) times daily. 11/20/20   [provider]  pantoprazole (PROTONIX) 40 MG tablet Take 1 tablet (40 mg total) by mouth daily. 04/30/21 07/29/21  Lorin Glass, MD  pantoprazole (PROTONIX) 40 MG tablet Take 1 tablet by mouth daily. 07/29/21   [provider]  rosuvastatin (CRESTOR) 10 MG tablet Take 10 mg by mouth daily. 11/17/20   [provider]  traZODone (DESYREL) 50 MG tablet Take 2 tablets by mouth at bedtime. 01/01/21   [provider]  venlafaxine XR (EFFEXOR-XR) 150 MG 24 hr capsule  Take 150 mg by mouth daily. 08/20/21   [provider]    Indica Marcott Rodriguez-Guzman PharmD, BCPS 04/19/2023 1:45 PM

## 2023-04-20 ENCOUNTER — Other Ambulatory Visit: Payer: Self-pay | Admitting: Internal Medicine

## 2023-04-20 DIAGNOSIS — R4182 Altered mental status, unspecified: Secondary | ICD-10-CM | POA: Diagnosis not present

## 2023-04-20 DIAGNOSIS — G9340 Encephalopathy, unspecified: Secondary | ICD-10-CM | POA: Diagnosis not present

## 2023-04-20 DIAGNOSIS — K7581 Nonalcoholic steatohepatitis (NASH): Secondary | ICD-10-CM | POA: Diagnosis not present

## 2023-04-20 DIAGNOSIS — K7682 Hepatic encephalopathy: Secondary | ICD-10-CM | POA: Diagnosis not present

## 2023-04-20 LAB — COMPREHENSIVE METABOLIC PANEL
ALT: 30 U/L (ref 0–44)
AST: 37 U/L (ref 15–41)
Albumin: 3.7 g/dL (ref 3.5–5.0)
Alkaline Phosphatase: 80 U/L (ref 38–126)
Anion gap: 7 (ref 5–15)
BUN: 17 mg/dL (ref 8–23)
CO2: 27 mmol/L (ref 22–32)
Calcium: 8.9 mg/dL (ref 8.9–10.3)
Chloride: 107 mmol/L (ref 98–111)
Creatinine, Ser: 0.69 mg/dL (ref 0.44–1.00)
GFR, Estimated: >60 mL/min (ref >60)
Glucose, Bld: 155 mg/dL — ABNORMAL HIGH (ref 70–99)
Potassium: 3.6 mmol/L (ref 3.5–5.1)
Sodium: 141 mmol/L (ref 135–145)
Total Bilirubin: 1 mg/dL (ref 0.3–1.2)
Total Protein: 6.5 g/dL (ref 6.5–8.1)

## 2023-04-20 LAB — CBC
HCT: 35.5 % — ABNORMAL LOW (ref 36.0–46.0)
Hemoglobin: 11.3 g/dL — ABNORMAL LOW (ref 12.0–15.0)
MCH: 25.3 pg — ABNORMAL LOW (ref 26.0–34.0)
MCHC: 31.8 g/dL (ref 30.0–36.0)
MCV: 79.4 fL — ABNORMAL LOW (ref 80.0–100.0)
Platelets: 142 10*3/uL — ABNORMAL LOW (ref 150–400)
RBC: 4.47 MIL/uL (ref 3.87–5.11)
RDW: 14.2 % (ref 11.5–15.5)
WBC: 4.8 10*3/uL (ref 4.0–10.5)
nRBC: 0 % (ref 0.0–0.2)

## 2023-04-20 LAB — AMMONIA: Ammonia: 46 umol/L — ABNORMAL HIGH (ref 9–35)

## 2023-04-20 MED ORDER — LACTULOSE 10 GM/15ML PO SOLN: 30.0000 g | Freq: Three times a day (TID) | ORAL | 0 refills | Status: AC

## 2023-04-20 MED ORDER — LACTULOSE 10 GM/15ML PO SOLN: 30.0000 g | Freq: Three times a day (TID) | ORAL | 0 refills | Status: DC

## 2023-04-20 NOTE — TOC Transition Note (Signed)
Transition of Care [facility]) - CM/SW Discharge Note   Patient Details  Name: Laura [name] MRN: [id number] Date of Birth: March 29, 1951  Transition of Care [facility]) CM/SW Contact:  Garret Reddish, RN Phone Number: 04/20/2023, 1:41 PM   Clinical Narrative: Chart reviewed.  Noted that patient will be a discharge for today.  Noted that patient will require Home Health services and Palliative Care services on discharge.    I have spoken to patient's daughter Leta Jungling.  Leta Jungling reports that prior to admission patient was independent of ADL's.  Patient was able to also drive. Leta Jungling reports that she also checks on her mother several times a day and is at her home checking on her in the mornings and evenings.  Leta Jungling reports that she lives a half a mile from her mother and is checking on her often.    I have spoken to Leta Jungling about home health and Palliative Care Services.  Leta Jungling has requested to use [facility] as Mrs. Dannunzio has used [facility] in the past.  I have asked Barbara Cower to accept home care referral.    I have also spoken to Leta Jungling about Palliative Care services on discharge.  Leta Jungling did not have a Palliative Care preference.  I have asked Ree Kida with [facility] to accept Palliative Care referral.   Leta Jungling will be transporting patient home today.    I have informed staff nurse of the above information.      Final next level of care: Home w Home Health Services Barriers to Discharge: No Barriers Identified   Patient Goals and CMS Choice CMS Medicare.gov Compare Post Acute Care list provided to:: Patient Choice offered to / list presented to : Adult Children, Patient  Discharge Placement                    Name of family member notified: Isac Sarna Patient and family notified of of transfer: 04/20/23  Discharge Plan and Services Additional resources added to the After Visit Summary for                            [facility] Arranged: RN, PT, OT, Nurse's Aide, Social Work [employer]  Agency: [facility] ([facility] Date HH Agency Contacted: 04/20/23 Time HH Agency Contacted: 1130 Representative spoke with at [facility] Agency: Barbara Cower  Social Determinants of Health (SDOH) Interventions SDOH Screenings   Food Insecurity: No Food Insecurity (04/19/2023)  Housing: Low Risk  (04/19/2023)  Transportation Needs: No Transportation Needs (04/19/2023)  Utilities: Not At Risk (04/19/2023)  Depression (PHQ2-9): Low Risk  (01/10/2021)  Tobacco Use: Low Risk  (07/28/2022)     Readmission Risk Interventions     No data to display

## 2023-04-20 NOTE — Discharge Summary (Signed)
Physician Discharge Summary   Patient: Laura [name] MRN: [id number] DOB: Sep 26, 1951  Admit date:     04/19/2023  Discharge date: 04/20/23  Discharge Physician: Delfino Lovett   PCP: Jerl Mina, MD   Recommendations at discharge:   F/up with Neuro Dr Sherryll Burger Jonathon Bellows F/up with Dr Ignatius Specking - Psych F/up with PCP as requested Recheck Ammonia when f/up with PCP in about a week  Discharge Diagnoses: Principal Problem:   Encephalopathy Active Problems:   GERD   Diabetic neuropathy (HCC)   BP (high blood pressure)   Uncontrolled type 2 diabetes mellitus with hyperglycemia (HCC)   NASH (nonalcoholic steatohepatitis)   Acute encephalopathy   Altered mental status   Hepatic encephalopathy [facility])  Hospital Course: Assessment and Plan: * Hepatic Encephalopathy + generalized confusion, disorientation x 2 weeks in setting of NASH cirrhosis  Nonfocal neuro exam w/ generalized confusion seen by Neuro and recommended mgmt of high ammonia CT head, CTA head and neck grossly stable  Ammonia level 49->46 with treatment of lactulose  Suspect component on baseline dementia - outpt f/up with Neuro  Recommend slowly titrate off Neurontin and Klonopin with help of her outpt Neuro and seek Psych appt soon. 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   Uncontrolled type 2 diabetes mellitus with hyperglycemia (HCC) BP (high blood pressure) Diabetic neuropathy (HCC) GERD         Consultants: Neuro Disposition: Home health with Palliative care Diet recommendation:  Discharge Diet Orders (From admission, onward)     Start     Ordered   04/20/23 0000  Diet - low sodium heart healthy        04/20/23 1040           Carb modified diet DISCHARGE MEDICATION: Allergies as of 04/20/2023       Reactions   Atenolol Palpitations    Clarithromycin Other (See Comments), Nausea And Vomiting   Makes the tongue raw REACTION: nausea   Doxycycline Palpitations   Makes her heart beat fast Other reaction(s): GI Upset (intolerance) Loose bowels REACTION: loose stool   Sulfa Antibiotics Nausea Only   Codeine Itching   Erythromycin Base Nausea And Vomiting   Prednisone Other (See Comments)   Mood swings   Penicillins Rash   Has patient had a PCN reaction causing immediate rash, facial/tongue/throat swelling, SOB or lightheadedness with hypotension: Yes Has patient had a PCN reaction causing severe rash involving mucus membranes or skin necrosis: No Has patient had a PCN reaction that required hospitalization No Has patient had a PCN reaction occurring within the last 10 years: No If all of the above answers are "NO", then may proceed with Cephalosporin use. REACTION: rash        Medication List     STOP taking these medications    amLODipine 5 MG tablet Commonly known as: NORVASC   cephALEXin 500 MG capsule Commonly known as: KEFLEX   fluticasone 50 MCG/ACT nasal spray Commonly known as: FLONASE   traZODone 50 MG tablet Commonly known as: DESYREL   VITAMIN D3 PO       TAKE these medications    benzonatate 200 MG capsule Commonly known as: TESSALON Take 200 mg by mouth 3 (three) times daily as needed for cough.   clonazePAM 1 MG tablet Commonly known as: KLONOPIN Take 1 mg by mouth  2 (two) times daily.   ferrous sulfate 325 (65 FE) MG tablet Take 1 tablet (325 mg total) by mouth daily with breakfast.   gabapentin 300 MG capsule Commonly known as: NEURONTIN TAKE 2 CAPSULES BY MOUTH 4 TIMES DAILY What changed:  how much to take how to take this when to take this additional instructions   glipiZIDE 5 MG 24 hr tablet Commonly known as: GLUCOTROL XL Take 5-10 mg by mouth 2 (two) times daily. Take 10 mg every morning and 5 mg every evening   hydrochlorothiazide 25 MG tablet Commonly known  as: HYDRODIURIL Take 25 mg by mouth daily.   losartan 50 MG tablet Commonly known as: COZAAR Take 50 mg by mouth daily.   metFORMIN 500 MG tablet Commonly known as: GLUCOPHAGE Take 500 mg by mouth 2 (two) times daily. What changed: Another medication with the same name was removed. Continue taking this medication, and follow the directions you see here.   metoprolol tartrate 25 MG tablet Commonly known as: LOPRESSOR Take 25 mg by mouth 2 (two) times daily.   NovoLOG FlexPen 100 UNIT/ML FlexPen Generic drug: insulin aspart Inject 0-60 Units into the skin 3 (three) times daily with meals. 60 units every morning and Sliding Scale for lunch and supper (14-22)   pantoprazole 40 MG tablet Commonly known as: PROTONIX Take 1 tablet (40 mg total) by mouth daily. What changed: Another medication with the same name was removed. Continue taking this medication, and follow the directions you see here.   rosuvastatin 10 MG tablet Commonly known as: CRESTOR Take 10 mg by mouth daily.   venlafaxine XR 150 MG 24 hr capsule Commonly known as: EFFEXOR-XR Take 150 mg by mouth daily.        Follow-up Information     Jerl Mina, MD. Go on 05/05/2023.   Specialty: Family Medicine Why: @10 :15am Contact information: [address] [facility] [place] [address] [phone]         Lonell Face, MD. Go on 06/03/2023.   Specialty: Neurology Why: @9 :15am. Office will call if their is a cancellation. Contact information: [address] [facility] [place] [address] [phone]                Discharge Exam: Filed Weights   04/19/23 1318 04/19/23 2111  Weight: 85.7 kg 82.9 kg   Gen: oriented x2, NAD HEENT: Atraumatic, normocephalic;mucous membranes moist; oropharynx clear, tongue without atrophy or fasciculations. Neck: Supple, trachea midline. Resp: CTAB, no w/r/r CV: RRR, no m/g/r; nml S1 and S2. 2+ symmetric peripheral  pulses. Abd: soft/NT/ND; nabs x 4 quad Extrem: Nml bulk; no cyanosis, clubbing, or edema. Neuro: Alert, awake, non-focal  Condition at discharge: fair  The results of significant diagnostics from this hospitalization (including imaging, microbiology, ancillary and laboratory) are listed below for reference.   Imaging Studies: DG Chest 2 View  Result Date: 04/19/2023 CLINICAL DATA:  Cough, weakness. EXAM: CHEST - 2 VIEW COMPARISON:  Chest radiograph 07/28/2022, 12/08/2021; CT chest 04/11/2022 FINDINGS: The heart size and mediastinal contours are within normal limits. Both lungs are clear. No pleural effusion or pneumothorax. Multilevel degenerative changes in the mid to distal thoracic spine. IMPRESSION: No active cardiopulmonary disease. Electronically Signed   By: Sherron Ales M.D.   On: 04/19/2023 15:04   CT ANGIO HEAD NECK W WO CM  Result Date: 04/19/2023 CLINICAL DATA:  Provided history: Neuro deficit, acute, stroke suspected. EXAM: CT ANGIOGRAPHY HEAD AND NECK WITH AND WITHOUT  CONTRAST TECHNIQUE: Multidetector CT imaging of the head and neck was performed using the standard protocol during bolus administration of intravenous contrast. Multiplanar CT image reconstructions and MIPs were obtained to evaluate the vascular anatomy. Carotid stenosis measurements (when applicable) are obtained utilizing NASCET criteria, using the distal internal carotid diameter as the denominator. RADIATION DOSE REDUCTION: This exam was performed according to the departmental dose-optimization program which includes automated exposure control, adjustment of the mA and/or kV according to patient size and/or use of iterative reconstruction technique. CONTRAST:  75mL OMNIPAQUE IOHEXOL 350 MG/ML SOLN COMPARISON:  Noncontrast head CT performed earlier today 04/19/2023. FINDINGS: CTA NECK FINDINGS Aortic arch: Standard aortic branching. Atherosclerotic plaque within the visualized aortic arch and proximal major branch  vessels of the neck. Streak and beam hardening artifact arising from a dense right-sided contrast bolus partially obscures the right subclavian artery. Within this limitation, there is no appreciable hemodynamically significant innominate or proximal subclavian artery stenosis. Right carotid system: CCA and ICA patent within the neck without stenosis or significant atherosclerotic disease. Left carotid system: The CCA and ICA patent within the neck without stenosis. Mild atherosclerotic plaque at the CCA origin and about the carotid bifurcation. Vertebral arteries: Vertebral arteries codominant and patent within the neck without stenosis or significant atherosclerotic disease. Skeleton: No acute fracture or aggressive osseous lesion. Other neck: No neck mass or cervical lymphadenopathy. Upper chest: No consolidation within the imaged lung apices. Review of the MIP images confirms the above findings CTA HEAD FINDINGS Anterior circulation: The intracranial internal carotid arteries are patent. Atherosclerotic plaque within both vessels with no more than mild stenosis. The M1 middle cerebral arteries are patent. No M2 proximal branch occlusion or high-grade proximal stenosis. The anterior cerebral arteries are patent. No intracranial aneurysm is identified. Posterior circulation: The intracranial vertebral arteries are patent. The basilar artery is patent. The posterior cerebral arteries are patent. Posterior communicating arteries are diminutive or absent bilaterally. Venous sinuses: Within the limitations of contrast timing, no convincing thrombus. Anatomic variants: As described Review of the MIP images confirms the above findings No emergent large vessel occlusion identified. These results were called by telephone at the time of interpretation on 04/19/2023 at 2:08 pm to provider Dr. Larinda Buttery, who verbally acknowledged these results. IMPRESSION: CTA neck: 1. The common carotid, internal carotid and vertebral arteries  are patent within the neck without stenosis. Mild atherosclerotic plaque within the left common carotid and cervical internal carotid arteries as described. 2.  Aortic Atherosclerosis (ICD10-I70.0). CTA head: 1. No intracranial large vessel occlusion or proximal high-grade arterial stenosis identified. 2. Atherosclerotic plaque within the intracranial internal carotid arteries with no more than mild stenosis. Electronically Signed   By: Jackey Loge D.O.   On: 04/19/2023 14:11   CT HEAD CODE STROKE WO CONTRAST  Result Date: 04/19/2023 CLINICAL DATA:  Code stroke. Neuro deficit, acute, stroke suspected. EXAM: CT HEAD WITHOUT CONTRAST TECHNIQUE: Contiguous axial images were obtained from the base of the skull through the vertex without intravenous contrast. RADIATION DOSE REDUCTION: This exam was performed according to the departmental dose-optimization program which includes automated exposure control, adjustment of the mA and/or kV according to patient size and/or use of iterative reconstruction technique. COMPARISON:  Brain MRI 11/13/2022.  Head CT 07/29/2019 FINDINGS: Brain: No age advanced or lobar predominant parenchymal atrophy. 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 There is no acute intracranial hemorrhage. No demarcated cortical infarct. No extra-axial fluid collection. No midline shift. Vascular: No hyperdense vessel.  Atherosclerotic calcifications. Skull: No fracture or aggressive osseous lesion. Sinuses/Orbits: No mass or acute finding within the imaged orbits. Minimal mucosal thickening within the bilateral maxillary sinuses, and within bilateral anterior ethmoid air cells. ASPECTS [facility] Stroke Program Early CT Score) - Ganglionic level infarction (caudate, lentiform nuclei, internal capsule, insula, M1-M3 cortex): 7 -  Supraganglionic infarction (M4-M6 cortex): 3 Total score (0-10 with 10 being normal): 10 No evidence of an acute intracranial abnormality. These results were called by telephone at the time of interpretation on 04/19/2023 at 1:42 pm to provider [facility] , who verbally acknowledged these results. IMPRESSION: 1. No evidence of an acute intracranial abnormality. 2. 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 Please refer to this prior examination for further description. 3. 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 Please refer to the prior brain MRI. Electronically Signed   By: Jackey Loge D.O.   On: 04/19/2023 13:44    Microbiology: Results for orders placed or performed during the hospital encounter of 08/30/21  Urine Culture     Status: Abnormal   Collection Time: 08/30/21 10:32 AM   Specimen: Urine, Random  Result Value Ref Range Status   Specimen Description   Final    URINE, RANDOM Performed at [facility], [address] [place], [place] [address]    Special Requests   Final    NONE Performed at [facility], [address] [place], [place] [address]    Culture >=100,000 COLONIES/mL KLEBSIELLA PNEUMONIAE (A)  Final   Report Status 09/02/2021 FINAL  Final   Organism ID, Bacteria KLEBSIELLA PNEUMONIAE (A)  Final      Susceptibility   Klebsiella pneumoniae - MIC*    AMPICILLIN >=32 RESISTANT Resistant     CEFAZOLIN <=4 SENSITIVE Sensitive     CEFEPIME <=0.12 SENSITIVE Sensitive     CEFTRIAXONE <=0.25 SENSITIVE Sensitive     CIPROFLOXACIN <=0.25 SENSITIVE Sensitive     GENTAMICIN <=1 SENSITIVE Sensitive     IMIPENEM <=0.25 SENSITIVE Sensitive     NITROFURANTOIN 128 RESISTANT Resistant     TRIMETH/SULFA >=320 RESISTANT Resistant     AMPICILLIN/SULBACTAM 4 SENSITIVE Sensitive     PIP/TAZO <=4 SENSITIVE Sensitive     *  >=100,000 COLONIES/mL KLEBSIELLA PNEUMONIAE    Labs: CBC: Recent Labs  Lab 04/19/23 1328 04/20/23 0559  WBC 5.5 4.8  NEUTROABS 3.9  --   HGB 11.7* 11.3*  HCT 37.5 35.5*  MCV 81.2 79.4*  PLT 166 142*   Basic Metabolic Panel: Recent Labs  Lab 04/19/23 1328 04/20/23 0559  NA 136 141  K 3.4* 3.6  CL 103 107  CO2 25 27  GLUCOSE 220* 155*  BUN 19 17  CREATININE 0.65 0.69  CALCIUM 8.9 8.9   Liver Function Tests: Recent Labs  Lab 04/19/23 1328 04/20/23 0559  AST 35 37  ALT 23 30  ALKPHOS 81 80  BILITOT 0.7 1.0  PROT 7.1 6.5  ALBUMIN 4.1 3.7   CBG: Recent Labs  Lab 04/19/23 1323  GLUCAP 224*    Discharge time spent: greater than 30 minutes.  Signed: Delfino Lovett, MD [facility] 04/20/2023

## 2023-04-20 NOTE — Plan of Care (Signed)
Patient is adequate for discharge with family support.

## 2023-04-20 NOTE — Plan of Care (Signed)
  Problem: Elimination: Goal: Will not experience complications related to bowel motility Outcome: Progressing   

## 2023-04-20 NOTE — Progress Notes (Signed)
Civil engineer, [employer] [facility]) Hospital Liaison Note:   (new referral for outpatient palliative services)  Notified by [facility], Jonetta Speak,  of patient/family request for [facility] Palliative Care services at home after discharge  Palliative care referral sent to the Palliative Admin team today.    Please call with any hospice or outpatient palliative care related questions. Thank you for the opportunity to participate in this patient's care.  Redge Gainer, [facility] Liaison [phone]

## 2023-04-24 ENCOUNTER — Other Ambulatory Visit: Payer: Self-pay

## 2023-04-24 DIAGNOSIS — Z515 Encounter for palliative care: Secondary | ICD-10-CM

## 2023-04-24 NOTE — Progress Notes (Signed)
TELEPHONE ENCOUNTER  Palliative care SW connected with patient  to discuss and review new PC referral.  Patients thought process during call was scattered and flighty. Patient endorsed that she has not feeling well and recently had a hospital visit due to this and was taking a medicine (lactulose) to help her feel better. Patient then shared that her daughter is ill with her and she is not sure why. Patient became tearful after mentioning her strained relationship with her daughter stated "I just want to see my dad". SW provided brief consoling and redirected back to original topic. Patient shared that her grandson and his family are currently residing with and providing support.   Plan: PC to f/u in home 5/30 @1 :15pm.

## 2023-05-07 ENCOUNTER — Other Ambulatory Visit: Payer: Self-pay

## 2023-05-07 VITALS — BP 160/68 | HR 85 | Temp 97.5°F

## 2023-05-07 DIAGNOSIS — Z515 Encounter for palliative care: Secondary | ICD-10-CM

## 2023-05-07 NOTE — Progress Notes (Signed)
PATIENT NAME: Laura [name] DOB: 1951-09-08 MRN: [id number]  PRIMARY CARE PROVIDER: Jerl Mina, MD  RESPONSIBLE PARTY:  Acct ID - Guarantor Home Phone Work Phone Relationship Acct Type  [id number] NURAH, BOEGER* [id number]  Self P/F     [address], [place], [place] [address]    Home visit completed with patient and [place], SW.  Connected with daughter Leta Jungling by phone.   ACP:  Has living will and reports daughter Leta Jungling helps her make decisions.  Discussed code status and will need to revisit this conversation with daughter given some cognitive impairment noted.   Cognitive Impairment:  Patient difficult to follow at times in conversation. Conversation rambling at times and diverts conversation back to family and loss of her spouse.  Followed by neurology and she thinks psych but uncertain.  Daughter Leta Jungling is assisting patient with appointments.  Depression:  Currently with counseling monthly and psych.  Patient experiencing grief after the loss of her spouse 3-4 years ago.   Diabetes:   Checking blood sugars 3 x daily.  States her readings are "up and down".    Non-alcoholic Cirrhosis:  states she is only taking lactulose 1 x weekly.  3 pm.  Connected with daughter Leta Jungling to update on PC visit and obtain additional information.   Advised of PC referral and visit completed today.  Daughter was unaware we were involved as patient had told her she did not need our services.  Daughter shares patient's mental status has been like this for about 3 1/2 years.  She is in counseling and will be followed by Dr. Mila Merry to address decreasing/discontinuing Neurontin and Klonopin.  Possibly just having one medication to address pain/depression.  Daughter is managing all of patient's medical and financial needs.  Discussed PC criteria and daughter did not feel services were needed at this time.  She has our contact information should services need to be restarted in the future.    CODE  STATUS: Full-Needs further discussion ADVANCED DIRECTIVES: Yes MOST FORM: No PPS: 50%   PHYSICAL EXAM:   VITALS: Today's Vitals   05/07/23 1342  BP: (!) 160/68  Pulse: 85  Temp: (!) 97.5 F (36.4 C)  SpO2: 95%    LUNGS: clear to auscultation  CARDIAC: Cor RRR}  EXTREMITIES: - for edema SKIN: Skin color, texture, turgor normal. No rashes or lesions or mobility and turgor normal  NEURO: positive for memory problems       Truitt Merle, RN

## 2023-05-26 ENCOUNTER — Other Ambulatory Visit: Payer: Self-pay | Admitting: Orthopedic Surgery

## 2023-05-26 DIAGNOSIS — M1711 Unilateral primary osteoarthritis, right knee: Secondary | ICD-10-CM

## 2023-05-26 DIAGNOSIS — M25561 Pain in right knee: Secondary | ICD-10-CM

## 2023-06-04 ENCOUNTER — Ambulatory Visit
Admission: RE | Admit: 2023-06-04 | Discharge: 2023-06-04 | Disposition: A | Discharge status: Home / Self Care | Payer: Medicare Other | Source: Ambulatory Visit | Attending: Orthopedic Surgery | Admitting: Orthopedic Surgery

## 2023-06-04 DIAGNOSIS — M1711 Unilateral primary osteoarthritis, right knee: Secondary | ICD-10-CM | POA: Insufficient documentation

## 2023-06-04 DIAGNOSIS — M25561 Pain in right knee: Secondary | ICD-10-CM | POA: Diagnosis present

## 2024-01-01 ENCOUNTER — Other Ambulatory Visit: Payer: Self-pay | Admitting: Internal Medicine

## 2024-01-01 DIAGNOSIS — K7460 Unspecified cirrhosis of liver: Secondary | ICD-10-CM

## 2024-01-04 ENCOUNTER — Ambulatory Visit
Admission: RE | Admit: 2024-01-04 | Discharge: 2024-01-04 | Disposition: A | Discharge status: Home / Self Care | Payer: Medicare Other | Source: Ambulatory Visit | Attending: Internal Medicine | Admitting: Internal Medicine

## 2024-01-04 DIAGNOSIS — K7460 Unspecified cirrhosis of liver: Secondary | ICD-10-CM | POA: Diagnosis present

## 2024-02-29 ENCOUNTER — Other Ambulatory Visit: Payer: Self-pay | Admitting: Orthopedic Surgery

## 2024-02-29 DIAGNOSIS — M4807 Spinal stenosis, lumbosacral region: Secondary | ICD-10-CM

## 2024-03-03 ENCOUNTER — Ambulatory Visit
Admission: RE | Admit: 2024-03-03 | Discharge: 2024-03-03 | Disposition: A | Discharge status: Home / Self Care | Source: Ambulatory Visit | Attending: Orthopedic Surgery | Admitting: Orthopedic Surgery

## 2024-03-03 DIAGNOSIS — M4807 Spinal stenosis, lumbosacral region: Secondary | ICD-10-CM | POA: Insufficient documentation

## 2024-06-06 ENCOUNTER — Other Ambulatory Visit: Payer: Self-pay

## 2024-06-06 ENCOUNTER — Ambulatory Visit (INDEPENDENT_AMBULATORY_CARE_PROVIDER_SITE_OTHER): Admitting: Vascular Surgery

## 2024-06-06 ENCOUNTER — Encounter (INDEPENDENT_AMBULATORY_CARE_PROVIDER_SITE_OTHER): Payer: Self-pay | Admitting: Vascular Surgery

## 2024-06-06 ENCOUNTER — Inpatient Hospital Stay
Admission: EM | Admit: 2024-06-06 | Discharge: 2024-06-08 | DRG: 291 | Disposition: A | Discharge status: Home / Self Care | Source: Ambulatory Visit | Attending: Internal Medicine | Admitting: Internal Medicine

## 2024-06-06 ENCOUNTER — Emergency Department

## 2024-06-06 VITALS — BP 108/66 | HR 89 | Ht 65.0 in | Wt 209.2 lb

## 2024-06-06 DIAGNOSIS — G4730 Sleep apnea, unspecified: Secondary | ICD-10-CM | POA: Diagnosis present

## 2024-06-06 DIAGNOSIS — E119 Type 2 diabetes mellitus without complications: Secondary | ICD-10-CM | POA: Diagnosis not present

## 2024-06-06 DIAGNOSIS — R0602 Shortness of breath: Secondary | ICD-10-CM | POA: Diagnosis present

## 2024-06-06 DIAGNOSIS — G8929 Other chronic pain: Secondary | ICD-10-CM | POA: Diagnosis present

## 2024-06-06 DIAGNOSIS — D509 Iron deficiency anemia, unspecified: Secondary | ICD-10-CM | POA: Diagnosis present

## 2024-06-06 DIAGNOSIS — R079 Chest pain, unspecified: Secondary | ICD-10-CM | POA: Diagnosis not present

## 2024-06-06 DIAGNOSIS — K7460 Unspecified cirrhosis of liver: Secondary | ICD-10-CM | POA: Diagnosis present

## 2024-06-06 DIAGNOSIS — I2510 Atherosclerotic heart disease of native coronary artery without angina pectoris: Secondary | ICD-10-CM | POA: Diagnosis present

## 2024-06-06 DIAGNOSIS — Z794 Long term (current) use of insulin: Secondary | ICD-10-CM | POA: Diagnosis not present

## 2024-06-06 DIAGNOSIS — Z5986 Financial insecurity: Secondary | ICD-10-CM | POA: Diagnosis not present

## 2024-06-06 DIAGNOSIS — Z90711 Acquired absence of uterus with remaining cervical stump: Secondary | ICD-10-CM

## 2024-06-06 DIAGNOSIS — E785 Hyperlipidemia, unspecified: Secondary | ICD-10-CM | POA: Diagnosis present

## 2024-06-06 DIAGNOSIS — J45909 Unspecified asthma, uncomplicated: Secondary | ICD-10-CM | POA: Diagnosis present

## 2024-06-06 DIAGNOSIS — I272 Pulmonary hypertension, unspecified: Secondary | ICD-10-CM | POA: Diagnosis present

## 2024-06-06 DIAGNOSIS — Z833 Family history of diabetes mellitus: Secondary | ICD-10-CM | POA: Diagnosis not present

## 2024-06-06 DIAGNOSIS — F341 Dysthymic disorder: Secondary | ICD-10-CM | POA: Diagnosis present

## 2024-06-06 DIAGNOSIS — I5031 Acute diastolic (congestive) heart failure: Secondary | ICD-10-CM | POA: Diagnosis present

## 2024-06-06 DIAGNOSIS — Z888 Allergy status to other drugs, medicaments and biological substances status: Secondary | ICD-10-CM

## 2024-06-06 DIAGNOSIS — Z7984 Long term (current) use of oral hypoglycemic drugs: Secondary | ICD-10-CM | POA: Diagnosis not present

## 2024-06-06 DIAGNOSIS — I110 Hypertensive heart disease with heart failure: Secondary | ICD-10-CM | POA: Diagnosis present

## 2024-06-06 DIAGNOSIS — M549 Dorsalgia, unspecified: Secondary | ICD-10-CM | POA: Diagnosis present

## 2024-06-06 DIAGNOSIS — K219 Gastro-esophageal reflux disease without esophagitis: Secondary | ICD-10-CM | POA: Diagnosis present

## 2024-06-06 DIAGNOSIS — K760 Fatty (change of) liver, not elsewhere classified: Secondary | ICD-10-CM | POA: Diagnosis present

## 2024-06-06 DIAGNOSIS — D508 Other iron deficiency anemias: Secondary | ICD-10-CM | POA: Diagnosis not present

## 2024-06-06 DIAGNOSIS — Z8249 Family history of ischemic heart disease and other diseases of the circulatory system: Secondary | ICD-10-CM

## 2024-06-06 DIAGNOSIS — Z880 Allergy status to penicillin: Secondary | ICD-10-CM

## 2024-06-06 DIAGNOSIS — Z5941 Food insecurity: Secondary | ICD-10-CM

## 2024-06-06 DIAGNOSIS — M1990 Unspecified osteoarthritis, unspecified site: Secondary | ICD-10-CM | POA: Diagnosis present

## 2024-06-06 DIAGNOSIS — I10 Essential (primary) hypertension: Secondary | ICD-10-CM | POA: Diagnosis not present

## 2024-06-06 DIAGNOSIS — F32A Depression, unspecified: Secondary | ICD-10-CM | POA: Diagnosis present

## 2024-06-06 DIAGNOSIS — E1140 Type 2 diabetes mellitus with diabetic neuropathy, unspecified: Secondary | ICD-10-CM | POA: Diagnosis present

## 2024-06-06 DIAGNOSIS — Z79899 Other long term (current) drug therapy: Secondary | ICD-10-CM | POA: Diagnosis not present

## 2024-06-06 DIAGNOSIS — F419 Anxiety disorder, unspecified: Secondary | ICD-10-CM | POA: Diagnosis present

## 2024-06-06 DIAGNOSIS — Z885 Allergy status to narcotic agent status: Secondary | ICD-10-CM

## 2024-06-06 DIAGNOSIS — Z5982 Transportation insecurity: Secondary | ICD-10-CM

## 2024-06-06 DIAGNOSIS — R053 Chronic cough: Secondary | ICD-10-CM | POA: Diagnosis present

## 2024-06-06 DIAGNOSIS — Z604 Social exclusion and rejection: Secondary | ICD-10-CM | POA: Diagnosis present

## 2024-06-06 DIAGNOSIS — I3481 Nonrheumatic mitral (valve) annulus calcification: Secondary | ICD-10-CM | POA: Diagnosis present

## 2024-06-06 DIAGNOSIS — I509 Heart failure, unspecified: Principal | ICD-10-CM

## 2024-06-06 DIAGNOSIS — Z881 Allergy status to other antibiotic agents status: Secondary | ICD-10-CM

## 2024-06-06 LAB — COMPREHENSIVE METABOLIC PANEL WITH GFR
ALT: 16 U/L (ref 0–44)
AST: 30 U/L (ref 15–41)
Albumin: 3.1 g/dL — ABNORMAL LOW (ref 3.5–5.0)
Alkaline Phosphatase: 65 U/L (ref 38–126)
Anion gap: 10 (ref 5–15)
BUN: 21 mg/dL (ref 8–23)
CO2: 25 mmol/L (ref 22–32)
Calcium: 8.8 mg/dL — ABNORMAL LOW (ref 8.9–10.3)
Chloride: 108 mmol/L (ref 98–111)
Creatinine, Ser: 0.74 mg/dL (ref 0.44–1.00)
GFR, Estimated: >60 mL/min (ref >60)
Glucose, Bld: 227 mg/dL — ABNORMAL HIGH (ref 70–99)
Potassium: 4 mmol/L (ref 3.5–5.1)
Sodium: 143 mmol/L (ref 135–145)
Total Bilirubin: 0.6 mg/dL (ref 0.0–1.2)
Total Protein: 5.8 g/dL — ABNORMAL LOW (ref 6.5–8.1)

## 2024-06-06 LAB — CBC
HCT: 26.8 % — ABNORMAL LOW (ref 36.0–46.0)
Hemoglobin: 8.1 g/dL — ABNORMAL LOW (ref 12.0–15.0)
MCH: 25.6 pg — ABNORMAL LOW (ref 26.0–34.0)
MCHC: 30.2 g/dL (ref 30.0–36.0)
MCV: 84.5 fL (ref 80.0–100.0)
Platelets: 162 10*3/uL (ref 150–400)
RBC: 3.17 MIL/uL — ABNORMAL LOW (ref 3.87–5.11)
RDW: 14.4 % (ref 11.5–15.5)
WBC: 4.2 10*3/uL (ref 4.0–10.5)
nRBC: 0 % (ref 0.0–0.2)

## 2024-06-06 LAB — BRAIN NATRIURETIC PEPTIDE: B Natriuretic Peptide: 99.9 pg/mL (ref 0.0–100.0)

## 2024-06-06 LAB — IRON AND TIBC
Iron: 26 ug/dL — ABNORMAL LOW (ref 28–170)
Saturation Ratios: 6 % — ABNORMAL LOW (ref 10.4–31.8)
TIBC: 468 ug/dL — ABNORMAL HIGH (ref 250–450)
UIBC: 442 ug/dL

## 2024-06-06 LAB — TROPONIN I (HIGH SENSITIVITY)
Troponin I (High Sensitivity): 16 ng/L (ref <18)
Troponin I (High Sensitivity): 18 ng/L — ABNORMAL HIGH (ref <18)

## 2024-06-06 MED ORDER — VENLAFAXINE HCL ER 75 MG PO CP24
150.0000 mg | ORAL_CAPSULE | Freq: Every day | ORAL | Status: DC
  Administered 2024-06-07: 150 mg via ORAL
  Filled 2024-06-06: qty 2

## 2024-06-06 MED ORDER — FUROSEMIDE 10 MG/ML IJ SOLN
60.0000 mg | Freq: Once | INTRAMUSCULAR | Status: AC
  Administered 2024-06-06: 60 mg via INTRAVENOUS
  Filled 2024-06-06: qty 8

## 2024-06-06 MED ORDER — GABAPENTIN 300 MG PO CAPS
600.0000 mg | ORAL_CAPSULE | Freq: Four times a day (QID) | ORAL | Status: DC
  Administered 2024-06-06: 600 mg via ORAL
  Filled 2024-06-06: qty 2

## 2024-06-06 MED ORDER — FERROUS SULFATE 325 (65 FE) MG PO TABS
325.0000 mg | ORAL_TABLET | Freq: Every day | ORAL | Status: DC
  Administered 2024-06-07: 325 mg via ORAL
  Filled 2024-06-06: qty 1

## 2024-06-06 MED ORDER — ONDANSETRON HCL 4 MG PO TABS
4.0000 mg | ORAL_TABLET | Freq: Four times a day (QID) | ORAL | Status: DC | PRN
Start: 2024-06-06 — End: 2024-06-08

## 2024-06-06 MED ORDER — METOPROLOL TARTRATE 25 MG PO TABS
25.0000 mg | ORAL_TABLET | Freq: Two times a day (BID) | ORAL | Status: DC
  Administered 2024-06-06 – 2024-06-08 (×4): 25 mg via ORAL
  Filled 2024-06-06 (×4): qty 1

## 2024-06-06 MED ORDER — ROSUVASTATIN CALCIUM 10 MG PO TABS
10.0000 mg | ORAL_TABLET | Freq: Every day | ORAL | Status: DC
  Administered 2024-06-07 – 2024-06-08 (×2): 10 mg via ORAL
  Filled 2024-06-06 (×2): qty 1

## 2024-06-06 MED ORDER — ACETAMINOPHEN 325 MG PO TABS: 650.0000 mg | ORAL_TABLET | Freq: Four times a day (QID) | ORAL | Status: DC | PRN

## 2024-06-06 MED ORDER — FUROSEMIDE 10 MG/ML IJ SOLN
40.0000 mg | Freq: Two times a day (BID) | INTRAMUSCULAR | Status: DC
  Administered 2024-06-07 – 2024-06-08 (×3): 40 mg via INTRAVENOUS
  Filled 2024-06-06 (×3): qty 4

## 2024-06-06 MED ORDER — ACETAMINOPHEN 650 MG RE SUPP: 650.0000 mg | Freq: Four times a day (QID) | RECTAL | Status: DC | PRN

## 2024-06-06 MED ORDER — LOSARTAN POTASSIUM 50 MG PO TABS
50.0000 mg | ORAL_TABLET | Freq: Every day | ORAL | Status: DC
  Administered 2024-06-07 – 2024-06-08 (×2): 50 mg via ORAL
  Filled 2024-06-06 (×2): qty 1

## 2024-06-06 MED ORDER — GABAPENTIN 300 MG PO CAPS
600.0000 mg | ORAL_CAPSULE | Freq: Four times a day (QID) | ORAL | Status: DC
  Administered 2024-06-07 – 2024-06-08 (×5): 600 mg via ORAL
  Filled 2024-06-06 (×6): qty 2

## 2024-06-06 MED ORDER — INSULIN ASPART 100 UNIT/ML IJ SOLN
0.0000 [IU] | Freq: Three times a day (TID) | INTRAMUSCULAR | Status: DC
  Administered 2024-06-07 (×2): 3 [IU] via SUBCUTANEOUS
  Administered 2024-06-07: 5 [IU] via SUBCUTANEOUS
  Administered 2024-06-08: 3 [IU] via SUBCUTANEOUS
  Administered 2024-06-08: 5 [IU] via SUBCUTANEOUS
  Filled 2024-06-06 (×5): qty 1

## 2024-06-06 MED ORDER — PANTOPRAZOLE SODIUM 40 MG PO TBEC
40.0000 mg | DELAYED_RELEASE_TABLET | Freq: Every day | ORAL | Status: DC
  Administered 2024-06-06 – 2024-06-08 (×3): 40 mg via ORAL
  Filled 2024-06-06 (×3): qty 1

## 2024-06-06 MED ORDER — ENOXAPARIN SODIUM 40 MG/0.4ML IJ SOSY
40.0000 mg | PREFILLED_SYRINGE | INTRAMUSCULAR | Status: DC
  Administered 2024-06-06 – 2024-06-07 (×2): 40 mg via SUBCUTANEOUS
  Filled 2024-06-06 (×2): qty 0.4

## 2024-06-06 MED ORDER — CLONAZEPAM 0.5 MG PO TABS: 1.0000 mg | ORAL_TABLET | Freq: Two times a day (BID) | ORAL | Status: DC

## 2024-06-06 MED ORDER — ONDANSETRON HCL 4 MG/2ML IJ SOLN: 4.0000 mg | Freq: Four times a day (QID) | INTRAMUSCULAR | Status: DC | PRN

## 2024-06-06 NOTE — Assessment & Plan Note (Signed)
 Patient noted to have a drop in her hemoglobin from 11.3-8.1 Denies having any obvious source of blood loss Serum iron is low at 26 Patient will need a screening endoscopy colonoscopy upon discharge

## 2024-06-06 NOTE — Assessment & Plan Note (Signed)
 Hold oral hypoglycemic agents Place patient on sliding scale insulin  Maintain consistent carbohydrate diet

## 2024-06-06 NOTE — ED Triage Notes (Signed)
 To ED from [facility] for CP, SOB, fluid on lungs and bilateral LE swelling since last week. Pt denies CHF/diuretics but does take something daily for her liver. CP is central, sharp with no radiation. Respirations are unlabored, skin dry. +2 pitting to both legs to knees.

## 2024-06-06 NOTE — ED Notes (Signed)
 Urine sent to the lab at 15:20

## 2024-06-06 NOTE — Progress Notes (Signed)
 Subjective:    Patient ID: Laura [name], female    DOB: June 13, 1951, 73 y.o.   MRN: [id number] Chief Complaint  Patient presents with   np. conuslt. BLE edema    Laura [name] is a 73 yo female who presents to clinic today with Chief complaint of bilateral lower leg swelling with pain.  Patient endorses leg swelling has gotten progressively worse over the last 1 to 2 weeks.  She does have a history of liver failure and is not on the transplant candidate list at this time.  She presents today with a cough and fatigue over the last 2 weeks.  She states that her blood glucose measurements have been out of control.  She has had chest pain x 1 week.  She endorses shortness of breath on room air.  Overall the patient states that she is just not feeling well.    Review of Systems  Constitutional:  Positive for fatigue.  Respiratory:  Positive for cough and shortness of breath.   Cardiovascular:  Positive for chest pain.  Musculoskeletal:  Positive for joint swelling.  All other systems reviewed and are negative.      Objective:   Physical Exam Constitutional:      Appearance: Normal appearance. She is obese.  HENT:     Head: Normocephalic.   Eyes:     Pupils: Pupils are equal, round, and reactive to light.    Cardiovascular:     Rate and Rhythm: Normal rate and regular rhythm.     Pulses: Normal pulses.     Heart sounds: Normal heart sounds.  Pulmonary:     Effort: Respiratory distress present.     Breath sounds: Rales present.  Abdominal:     General: Abdomen is flat.     Palpations: Abdomen is soft.   Musculoskeletal:     Right lower leg: Edema present.     Left lower leg: Edema present.   Skin:    General: Skin is warm and dry.     Capillary Refill: Capillary refill takes 2 to 3 seconds.   Neurological:     General: No focal deficit present.     Mental Status: She is alert and oriented to person, place, and time. Mental status is at baseline.   Psychiatric:         Mood and Affect: Mood normal.        Behavior: Behavior normal.        Thought Content: Thought content normal.        Judgment: Judgment normal.    BP 108/66   Pulse 89   Ht 5' 5 (1.651 m)   Wt 209 lb 4 oz (94.9 kg)   BMI 34.82 kg/m   Past Medical History:  Diagnosis Date   Allergy    Anxiety    Asthma    Chronic back pain    Chronic cough 07/01/10   PFT  FEV1 2.20 (93%), FEV 1% 81, TLC 4,12 (81%), DLCO 78%, no BD. 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   Depression    Diabetes mellitus type 2, uncomplicated (HCC)    Diabetes mellitus without complication (HCC)    Diabetic neuropathy (HCC)    Diverticulosis    Dysrhythmia    H/O PALPITATIONS-SINCE ABLATION IN 2013 AT [facility], PALPITATIONS MUCH BETTER   GERD (gastroesophageal reflux disease)    NO MEDS   Hemorrhoids    HTN (hypertension)    NAFLD (nonalcoholic fatty  liver disease) 01/18/2018   Neuropathy    Osteoarthritis    Sleep apnea    NO CPAP    Social History   Socioeconomic History   Marital status: Widowed    Spouse name: Not on file   Number of children: Not on file   Years of education: Not on file   Highest education level: Not on file  Occupational History   Not on file  Tobacco Use   Smoking status: Never   Smokeless tobacco: Never  Vaping Use   Vaping status: Never Used  Substance and Sexual Activity   Alcohol use: No    Alcohol/week: 0.0 standard drinks of alcohol   Drug use: No   Sexual activity: Not on file  Other Topics Concern   Not on file  Social History Narrative   Married, 2 children. Works in day care center   Cell: [phone]    Social Drivers of Health   Financial Resource Strain: Medium Risk (04/25/2024)   Received from [facility]   Overall Financial Resource Strain (CARDIA)    Difficulty of Paying Living Expenses: Somewhat hard  Food Insecurity: Food Insecurity Present (04/25/2024)   Received from [facility]   Hunger Vital Sign    Within the past 12 months, you worried that your food would run out before you got the money to buy more.: Sometimes true    Within the past 12 months, the food you bought just didn't last and you didn't have money to get more.: Sometimes true  Transportation Needs: Unmet Transportation Needs (04/25/2024)   Received from [facility] - Transportation    In the past 12 months, has lack of transportation kept you from medical appointments or from getting medications?: Yes    Lack of Transportation (Non-Medical): No  Physical Activity: Not on file  Stress: Not on file  Social Connections: Not on file  Intimate Partner Violence: Not At Risk (04/19/2023)   Humiliation, Afraid, Rape, and Kick questionnaire    Fear of Current or Ex-Partner: No    Emotionally Abused: No    Physically Abused: No    Sexually Abused: No    Past Surgical History:  Procedure Laterality Date   BRONCHOSCOPY  07/25/10   CARDIAC ELECTROPHYSIOLOGY STUDY AND ABLATION     CARPAL TUNNEL RELEASE     CATARACT EXTRACTION Bilateral Jan 2017   cathater ablation     for arrhythmia   COLONOSCOPY  2014   COLONOSCOPY WITH PROPOFOL  N/A 09/07/2019   Procedure: COLONOSCOPY WITH PROPOFOL ;  Surgeon: Toledo, Ladell POUR, MD;  Location: [facility] ENDOSCOPY;  Service: Gastroenterology;  Laterality: N/A;   ESOPHAGOGASTRODUODENOSCOPY (EGD) WITH PROPOFOL  N/A 07/05/2018   Procedure: ESOPHAGOGASTRODUODENOSCOPY (EGD) WITH PROPOFOL ;  Surgeon: Viktoria Lamar DASEN, MD;  Location: [facility] ENDOSCOPY;  Service: Endoscopy;  Laterality: N/A;   FOOT SURGERY     HEMORROIDECTOMY     LIPOMA EXCISION Right 10/20/2016   Procedure: EXCISION LIPOMA;  Surgeon: Louanne KANDICE Muse, MD;  Location: [facility] ORS;  Service: General;  Laterality: Right;   PARTIAL HYSTERECTOMY     TOENAIL EXCISION Right     Family History  Problem Relation Age of Onset   Cancer Mother        stomach   Diabetes Mother     Cancer Father        brain   Heart disease Father    Cancer Brother        mouth  Heart disease Brother    Ovarian cancer Maternal Grandmother     Allergies  Allergen Reactions   Atenolol Palpitations   Clarithromycin Other (See Comments) and Nausea And Vomiting    Makes the tongue raw REACTION: nausea   Doxycycline Palpitations    Makes her heart beat fast Other reaction(s): GI Upset (intolerance) Loose bowels REACTION: loose stool   Sulfa Antibiotics Nausea Only   Codeine Itching   Erythromycin Base Nausea And Vomiting   Prednisone Other (See Comments)    Mood swings   Penicillins Rash    Has patient had a PCN reaction causing immediate rash, facial/tongue/throat swelling, SOB or lightheadedness with hypotension: Yes Has patient had a PCN reaction causing severe rash involving mucus membranes or skin necrosis: No Has patient had a PCN reaction that required hospitalization No Has patient had a PCN reaction occurring within the last 10 years: No If all of the above answers are NO, then may proceed with Cephalosporin use.  REACTION: rash        Latest Ref Rng & Units 04/20/2023    5:59 AM 04/19/2023    1:28 PM 07/28/2022   11:47 AM  CBC  WBC 4.0 - 10.5 K/uL 4.8  5.5  4.7   Hemoglobin 12.0 - 15.0 g/dL 88.6  88.2  88.6   Hematocrit 36.0 - 46.0 % 35.5  37.5  36.4   Platelets 150 - 400 K/uL 142  166  139       CMP     Component Value Date/Time   NA 141 04/20/2023 0559   K 3.6 04/20/2023 0559   CL 107 04/20/2023 0559   CO2 27 04/20/2023 0559   GLUCOSE 155 (H) 04/20/2023 0559   BUN 17 04/20/2023 0559   CREATININE 0.69 04/20/2023 0559   CALCIUM  8.9 04/20/2023 0559   PROT 6.5 04/20/2023 0559   ALBUMIN 3.7 04/20/2023 0559   AST 37 04/20/2023 0559   ALT 30 04/20/2023 0559   ALKPHOS 80 04/20/2023 0559   BILITOT 1.0 04/20/2023 0559   GFRNONAA >60 04/20/2023 0559     No results found.     Assessment & Plan:   1. Chest pain, unspecified type  (Primary) Patient presents to clinic today with chest pain x 1 week with shortness of breath on room air.  Patient's vital signs are stable but her oxygen saturation on room air was 93%.  On physical exam she has got left lung rales halfway up.  She has a nonproductive cough.  She feels fatigued.  She also has bilateral lower extremity leg swelling with +2 edema all the way through her feet.  Patient has a known history of liver failure and she was started on lactulose  about a month ago.  She is currently not on the transplant list for transplant.  Due to the patient's current condition I immediately took the patient to the emergency room for further workup related to her chest pain and shortness of breath.  Patient had gotten her daughter on the phone during this visit and examination.  I spoke with her daughter who recommended the same thing.  Daughter was made aware and will meet the patient in the emergency department.  2. Shortness of breath Patient presents to clinic today with shortness of breath.  On exam she had rales halfway up.  She had 93% oxygen saturation on room air.  She also had chest pain which started about a week ago.  I recommended the patient be taken to the emergency  room immediately.  Patient to follow-up with us  outpatient again for bilateral lower extremity swelling when she recovers from her current condition.  I believe her current condition may have more of an effect on her lower extremity swelling that she was led to believe.   Current Outpatient Medications on File Prior to Visit  Medication Sig Dispense Refill   benzonatate  (TESSALON ) 200 MG capsule Take 200 mg by mouth 3 (three) times daily as needed for cough.     clonazePAM  (KLONOPIN ) 1 MG tablet Take 1 mg by mouth 2 (two) times daily.     ferrous sulfate  325 (65 FE) MG tablet Take 1 tablet (325 mg total) by mouth daily with breakfast. 30 tablet 2   gabapentin  (NEURONTIN ) 300 MG capsule TAKE 2 CAPSULES BY MOUTH 4  TIMES DAILY (Patient taking differently: Take 600 mg by mouth 4 (four) times daily. TAKE 3 CAPSULES BY MOUTH TWICE TIMES DAILY) 240 capsule 0   glipiZIDE  (GLUCOTROL  XL) 5 MG 24 hr tablet Take 5-10 mg by mouth 2 (two) times daily. Take 10 mg every morning and 5 mg every evening     hydrochlorothiazide  (HYDRODIURIL ) 25 MG tablet Take 25 mg by mouth daily.     losartan  (COZAAR ) 50 MG tablet Take 50 mg by mouth daily.     metFORMIN  (GLUCOPHAGE ) 500 MG tablet Take 500 mg by mouth 2 (two) times daily.     metoprolol  tartrate (LOPRESSOR ) 25 MG tablet Take 25 mg by mouth 2 (two) times daily.     NOVOLOG  FLEXPEN 100 UNIT/ML FlexPen Inject 0-60 Units into the skin 3 (three) times daily with meals. 60 units every morning and Sliding Scale for lunch and supper (14-22)     pantoprazole  (PROTONIX ) 40 MG tablet Take 1 tablet (40 mg total) by mouth daily. 30 tablet 2   rosuvastatin  (CRESTOR ) 10 MG tablet Take 10 mg by mouth daily.     venlafaxine  XR (EFFEXOR -XR) 150 MG 24 hr capsule Take 150 mg by mouth daily.     No current facility-administered medications on file prior to visit.    There are no Patient Instructions on file for this visit. No follow-ups on file.   Gwendlyn JONELLE Shank, NP

## 2024-06-06 NOTE — Assessment & Plan Note (Signed)
 Continue venlafaxine and clonazepam 

## 2024-06-06 NOTE — Assessment & Plan Note (Addendum)
 Concerning for diastolic dysfunction CHF Patient presents to the ER for evaluation of bilateral lower extremity swelling, exertional shortness of breath and two-pillow orthopnea Imaging is concerning for mild cardiomegaly with vascular congestion BNP is elevated at 99 Will place patient on Lasix 40 mg IV twice daily Continue metoprolol  and losartan  Last 2D echocardiogram was from 2023 and showed an LVEF greater than 55%. Will repeat 2D echocardiogram Consult cardiology

## 2024-06-06 NOTE — ED Notes (Signed)
 Pt requesting water. Dr. Dorothyann approved.

## 2024-06-06 NOTE — ED Provider Notes (Signed)
 [facility] Provider Note    Event Date/Time   First MD Initiated Contact with Patient 06/06/24 1504     (approximate)  History   Chief Complaint: Chest Pain and Shortness of Breath  HPI  Laura [name] is a 73 y.o. female with a past medical history of anxiety, diabetes, gastric reflux, hypertension, presents to the emergency department for shortness of breath and peripheral edema.  Patient was referred to [facility] for lower extremity edema, was seen there and sent to the emergency department for evaluation.  Patient has 2+ pitting edema which is new over the past 1 week or so per patient.  States she has had progressive worsening shortness of breath to the point where she can no longer lay flat has been sleeping upright in a chair or with pillows.  States slight chest discomfort as well.  Patient has never been prescribed diuretics denies any history of CHF previously.  Patient does have a history of [place]/fatty liver with a history of hepatic encephalopathy previously documented.  Physical Exam   Triage Vital Signs: ED Triage Vitals  Encounter Vitals Group     BP 06/06/24 1416 (!) 130/55     Girls Systolic BP Percentile --      Girls Diastolic BP Percentile --      Boys Systolic BP Percentile --      Boys Diastolic BP Percentile --      Pulse Rate 06/06/24 1416 86     Resp 06/06/24 1416 20     Temp 06/06/24 1416 98.4 F (36.9 C)     Temp Source 06/06/24 1416 Oral     SpO2 06/06/24 1416 95 %     Weight 06/06/24 1418 208 lb (94.3 kg)     Height 06/06/24 1418 5' 6 (1.676 m)     Head Circumference --      Peak Flow --      Pain Score 06/06/24 1415 7     Pain Loc --      Pain Education --      Exclude from Growth Chart --     Most recent vital signs: Vitals:   06/06/24 1416  BP: (!) 130/55  Pulse: 86  Resp: 20  Temp: 98.4 F (36.9 C)  SpO2: 95%    General: Awake, no distress.  CV:  Good peripheral perfusion.  Regular rate  and rhythm  Resp:  Normal effort.  Equal breath sounds bilaterally.  Abd:  No distention.  Soft, nontender.  No rebound or guarding. Other:  2+ peripheral edema bilateral lower extremities to the level of the knee.   ED Results / Procedures / Treatments   EKG  EKG viewed and interpreted by myself shows a normal sinus rhythm at 84 bpm with a narrow QRS, normal axis, slight QTc prolongation otherwise normal intervals with nonspecific ST changes.  RADIOLOGY  I have reviewed and interpreted chest x-ray images.  No consolidation on my evaluation.  Does appear to have borderline cardiomegaly. Radiology has read the x-ray as mild cardiomegaly with vascular congestion   MEDICATIONS ORDERED IN ED: Medications  furosemide (LASIX) injection 60 mg (60 mg Intravenous Given 06/06/24 1605)     IMPRESSION / MDM / ASSESSMENT AND PLAN / ED COURSE  I reviewed the triage vital signs and the nursing notes.  Patient's presentation is most consistent with acute presentation with potential threat to life or bodily function.  Patient presents to the emergency department for 1 week  of worsening shortness of breath intermittent chest pain with new onset peripheral edema 2+ up to the knees now.  Patient's lab work today shows a reassuring chemistry with normal LFTs.  Patient's BNP is normal, troponin is normal.  Patient's CBC does show mild anemia but otherwise no concerning findings.  Chest x-ray concerning for vascular congestion with cardiomegaly and new onset 2+ lower extremity edema.  Given the patient's worsening shortness of breath intermittent chest pain and new onset edema with vascular congestion on chest x-ray is satting in the low to mid 90s on room air we will admit for IV diuresis.  I have ordered 60 mg of IV Lasix.  Differential would include new onset CHF, versus cirrhosis.  Patient agreeable to plan of care.  FINAL CLINICAL IMPRESSION(S) / ED DIAGNOSES   Dyspnea Peripheral edema  Note:  This  document was prepared using Dragon voice recognition software and may include unintentional dictation errors.   Dorothyann Drivers, MD 06/06/24 [phone]

## 2024-06-06 NOTE — Assessment & Plan Note (Signed)
 Continue losartan and metoprolol

## 2024-06-06 NOTE — H&P (Signed)
 History and Physical    Patient: Laura [name] FMW:[id number] DOB: Feb 23, 1951 DOA: 06/06/2024 DOS: the patient was seen and examined on 06/06/2024 PCP: Null Agent, MD  Patient coming from: Home  Chief Complaint:  Chief Complaint  Patient presents with   Chest Pain   Shortness of Breath   HPI: Laura [name] is a 73 y.o. 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 Patient notes that she has had lower extremity swelling over the last 1 week associated with shortness Of breath mostly with exertion.  She has 2 pillow orthopnea and chest discomfort. She denies having any nausea, no vomiting, no abdominal pain, no cough, no fever, no chills, no blurred vision, no urinary symptoms or focal deficit. Abnormal labs include hemoglobin of 8.1 down from 11.3 from a year ago, BNP 99 Chest x-ray reviewed by me shows Mild cardiomegaly with mild vascular congestion.  Twelve-lead EKG reviewed by me shows sinus rhythm with nonspecific ST and T wave changes. Patient received a dose of Lasix 60 mg IV in the ER and will be admitted to the hospital for further evaluation    Review of Systems: As mentioned in the history of present illness. All other systems reviewed and are negative. Past Medical History:  Diagnosis Date   Allergy    Anxiety    Asthma    Chronic back pain    Chronic cough 07/01/10   PFT  FEV1 2.20 (93%), FEV 1% 81, TLC 4,12 (81%), DLCO 78%, no BD. 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   Depression    Diabetes mellitus type 2, uncomplicated (HCC)    Diabetes mellitus without complication (HCC)    Diabetic neuropathy (HCC)    Diverticulosis    Dysrhythmia    H/O PALPITATIONS-SINCE ABLATION IN 2013 AT [facility],  PALPITATIONS MUCH BETTER   GERD (gastroesophageal reflux disease)    NO MEDS   Hemorrhoids    HTN (hypertension)    NAFLD (nonalcoholic fatty liver disease) 97/88/7980   Neuropathy    Osteoarthritis    Sleep apnea    NO CPAP   Past Surgical History:  Procedure Laterality Date   BRONCHOSCOPY  07/25/10   CARDIAC ELECTROPHYSIOLOGY STUDY AND ABLATION     CARPAL TUNNEL RELEASE     CATARACT EXTRACTION Bilateral Jan 2017   cathater ablation     for arrhythmia   COLONOSCOPY  2014   COLONOSCOPY WITH PROPOFOL  N/A 09/07/2019   Procedure: COLONOSCOPY WITH PROPOFOL ;  Surgeon: Toledo, Ladell POUR, MD;  Location: [facility] ENDOSCOPY;  Service: Gastroenterology;  Laterality: N/A;   ESOPHAGOGASTRODUODENOSCOPY (EGD) WITH PROPOFOL  N/A 07/05/2018   Procedure: ESOPHAGOGASTRODUODENOSCOPY (EGD) WITH PROPOFOL ;  Surgeon: Viktoria Lamar DASEN, MD;  [facility] ENDOSCOPY;  Service: Endoscopy;  Laterality: N/A;   FOOT SURGERY     HEMORROIDECTOMY     LIPOMA EXCISION Right 10/20/2016   Procedure: EXCISION LIPOMA;  Surgeon: Louanne KANDICE Muse, MD;  Location: [facility] ORS;  Service: General;  Laterality: Right;   PARTIAL HYSTERECTOMY     TOENAIL EXCISION Right    Social History:  reports that she has never smoked. She has never used smokeless tobacco. She reports that she does not drink alcohol and does not use drugs.  Allergies  Allergen Reactions   Atenolol Palpitations   Clarithromycin Other (See Comments) and  Nausea And Vomiting    Makes the tongue raw REACTION: nausea   Doxycycline Palpitations    Makes her heart beat fast Other reaction(s): GI Upset (intolerance) Loose bowels REACTION: loose stool   Codeine Itching   Erythromycin Base Nausea And Vomiting   Prednisone Other (See Comments)    Mood swings   Penicillins Rash    Has patient had a PCN reaction causing immediate rash, facial/tongue/throat swelling, SOB or lightheadedness with hypotension: Yes Has patient had a PCN reaction causing severe rash  involving mucus membranes or skin necrosis: No Has patient had a PCN reaction that required hospitalization No Has patient had a PCN reaction occurring within the last 10 years: No If all of the above answers are NO, then may proceed with Cephalosporin use.  REACTION: rash     Family History  Problem Relation Age of Onset   Cancer Mother        stomach   Diabetes Mother    Cancer Father        brain   Heart disease Father    Cancer Brother        mouth   Heart disease Brother    Ovarian cancer Maternal Grandmother     Prior to Admission medications   Medication Sig Start Date End Date Taking? Authorizing Provider  benzonatate  (TESSALON ) 200 MG capsule Take 200 mg by mouth 3 (three) times daily as needed for cough. 03/23/23   [provider]  clonazePAM  (KLONOPIN ) 1 MG tablet Take 1 mg by mouth 2 (two) times daily. 07/02/20   [provider]  ferrous sulfate  325 (65 FE) MG tablet Take 1 tablet (325 mg total) by mouth daily with breakfast. 05/01/21 06/06/24  Dahal, Chapman, MD  gabapentin  (NEURONTIN ) 300 MG capsule TAKE 2 CAPSULES BY MOUTH 4 TIMES DAILY Patient taking differently: Take 600 mg by mouth 4 (four) times daily. TAKE 3 CAPSULES BY MOUTH TWICE TIMES DAILY 01/02/21   Britta King, MD  glipiZIDE  (GLUCOTROL  XL) 5 MG 24 hr tablet Take 5-10 mg by mouth 2 (two) times daily. Take 10 mg every morning and 5 mg every evening 01/29/16   [provider]  hydrochlorothiazide  (HYDRODIURIL ) 25 MG tablet Take 25 mg by mouth daily. 11/13/20   [provider]  losartan  (COZAAR ) 50 MG tablet Take 50 mg by mouth daily. 12/13/20   [provider]  metFORMIN  (GLUCOPHAGE ) 500 MG tablet Take 500 mg by mouth 2 (two) times daily. 12/15/20   [provider]  metoprolol  tartrate (LOPRESSOR ) 25 MG tablet Take 25 mg by mouth 2 (two) times daily. 11/20/20   [provider]  NOVOLOG  FLEXPEN 100 UNIT/ML FlexPen Inject 0-60 Units into the skin 3 (three)  times daily with meals. 60 units every morning and Sliding Scale for lunch and supper (14-22) 02/10/23   [provider]  pantoprazole  (PROTONIX ) 40 MG tablet Take 1 tablet (40 mg total) by mouth daily. 04/30/21 06/06/24  Arlice Chapman, MD  rosuvastatin  (CRESTOR ) 10 MG tablet Take 10 mg by mouth daily. 11/17/20   [provider]  venlafaxine XR (EFFEXOR-XR) 150 MG 24 hr capsule Take 150 mg by mouth daily. 08/20/21   [provider]    Physical Exam: Vitals:   06/06/24 1416 06/06/24 1418  BP: (!) 130/55   Pulse: 86   Resp: 20   Temp: 98.4 F (36.9 C)   TempSrc: Oral   SpO2: 95%   Weight:  94.3 kg  Height:  5' 6 (1.676 m)  Physical Exam Vitals and nursing note reviewed.  Constitutional:      Appearance: She is obese.  HENT:     Head: Normocephalic and atraumatic.   Cardiovascular:     Rate and Rhythm: Normal rate and regular rhythm.     Heart sounds: Normal heart sounds.  Pulmonary:     Effort: Pulmonary effort is normal.     Breath sounds: Examination of the right-lower field reveals rales. Examination of the left-lower field reveals rales. Rales present.  Abdominal:     General: Bowel sounds are normal.     Palpations: Abdomen is soft.   Musculoskeletal:     Cervical back: Normal range of motion.     Right lower leg: Edema present.     Left lower leg: Edema present.   Skin:    General: Skin is warm and dry.   Neurological:     General: No focal deficit present.     Mental Status: She is alert and oriented to person, place, and time.   Psychiatric:        Mood and Affect: Mood normal.        Behavior: Behavior normal.     Data Reviewed: Relevant notes from primary care and specialist visits, past discharge summaries as available in EHR, including Care Everywhere. Prior diagnostic testing as pertinent to current admission diagnoses Updated medications and problem lists for reconciliation ED course, including vitals, labs, imaging,  treatment and response to treatment Triage notes, nursing and pharmacy notes and ED provider's notes Notable results as noted in HPI Labs reviewed.  Iron 26, total iron-binding capacity 468, troponin 16 >> 18, sodium 143, potassium 4.0, chloride 108, bicarb 25, glucose 227, BUN 21, creatinine 0.74, calcium  8.8, total protein 5.8, albumin 3.1, AST 30, ALT 16, alkaline phosphatase 65, white count 4.2, hemoglobin 8.1 down from 11.3, hematocrit 26.8, platelet count 162 Labs reviewed  Assessment and Plan: * Acute CHF (congestive heart failure) (HCC) Concerning for diastolic dysfunction CHF Patient presents to the ER for evaluation of bilateral lower extremity swelling, exertional shortness of breath and two-pillow orthopnea Imaging is concerning for mild cardiomegaly with vascular congestion BNP is elevated at 99 Will place patient on Lasix 40 mg IV twice daily Continue metoprolol  and losartan  Last 2D echocardiogram was from 2023 and showed an LVEF greater than 55%. Will repeat 2D echocardiogram Consult cardiology  Iron deficiency anemia Patient noted to have a drop in her hemoglobin from 11.3-8.1 Denies having any obvious source of blood loss Serum iron is low at 26 Patient will need a screening endoscopy colonoscopy upon discharge  ANXIETY DEPRESSION Continue venlafaxine and clonazepam   Essential hypertension Continue losartan  and metoprolol   Diabetes mellitus without complication (HCC) Hold oral hypoglycemic agents Place patient on sliding scale insulin  Maintain consistent carbohydrate diet      Advance Care Planning:   Code Status: Full Code   Consults: Cardiology  Family Communication: Plan of care was discussed with patient at the bedside.  She verbalizes understanding and agrees with the plan.  Severity of Illness: The appropriate patient status for this patient is INPATIENT. Inpatient status is judged to be reasonable and necessary in order to provide the required  intensity of service to ensure the patient's safety. The patient's presenting symptoms, physical exam findings, and initial radiographic and laboratory data in the context of their chronic comorbidities is felt to place them at high risk for further clinical deterioration. Furthermore, it is not anticipated that the patient will be medically stable for  discharge from the hospital within 2 midnights of admission.   * I certify that at the point of admission it is my clinical judgment that the patient will require inpatient hospital care spanning beyond 2 midnights from the point of admission due to high intensity of service, high risk for further deterioration and high frequency of surveillance required.*  Author: Aimee Somerset, MD 06/06/2024 6:00 PM  For on call review [email or web address].

## 2024-06-07 ENCOUNTER — Other Ambulatory Visit (HOSPITAL_COMMUNITY): Payer: Self-pay

## 2024-06-07 ENCOUNTER — Inpatient Hospital Stay
Admit: 2024-06-07 | Discharge: 2024-06-07 | Disposition: A | Discharge status: Home / Self Care | Attending: Internal Medicine | Admitting: Internal Medicine

## 2024-06-07 ENCOUNTER — Telehealth (HOSPITAL_COMMUNITY): Payer: Self-pay | Admitting: Pharmacy Technician

## 2024-06-07 ENCOUNTER — Inpatient Hospital Stay: Admit: 2024-06-07

## 2024-06-07 DIAGNOSIS — I5031 Acute diastolic (congestive) heart failure: Secondary | ICD-10-CM | POA: Diagnosis not present

## 2024-06-07 LAB — VITAMIN B12: Vitamin B-12: 552 pg/mL (ref 180–914)

## 2024-06-07 LAB — BASIC METABOLIC PANEL WITH GFR
Anion gap: 8 (ref 5–15)
BUN: 21 mg/dL (ref 8–23)
CO2: 29 mmol/L (ref 22–32)
Calcium: 8.7 mg/dL — ABNORMAL LOW (ref 8.9–10.3)
Chloride: 105 mmol/L (ref 98–111)
Creatinine, Ser: 0.68 mg/dL (ref 0.44–1.00)
GFR, Estimated: >60 mL/min (ref >60)
Glucose, Bld: 125 mg/dL — ABNORMAL HIGH (ref 70–99)
Potassium: 3.9 mmol/L (ref 3.5–5.1)
Sodium: 142 mmol/L (ref 135–145)

## 2024-06-07 LAB — ECHOCARDIOGRAM COMPLETE
AR max vel: 1.01 cm2
AV Area VTI: 1.2 cm2
AV Area mean vel: 1.07 cm2
AV Mean grad: 18.8 mmHg
AV Peak grad: 28.9 mmHg
Ao pk vel: 2.69 m/s
Area-P 1/2: 3.65 cm2
Height: 66 in
MV VTI: 1.44 cm2
P 1/2 time: 632 ms
S' Lateral: 3.6 cm
Weight: 3326.3 [oz_av]

## 2024-06-07 LAB — CBC
HCT: 25.6 % — ABNORMAL LOW (ref 36.0–46.0)
Hemoglobin: 7.8 g/dL — ABNORMAL LOW (ref 12.0–15.0)
MCH: 25.1 pg — ABNORMAL LOW (ref 26.0–34.0)
MCHC: 30.5 g/dL (ref 30.0–36.0)
MCV: 82.3 fL (ref 80.0–100.0)
Platelets: 154 10*3/uL (ref 150–400)
RBC: 3.11 MIL/uL — ABNORMAL LOW (ref 3.87–5.11)
RDW: 14.6 % (ref 11.5–15.5)
WBC: 4.7 10*3/uL (ref 4.0–10.5)
nRBC: 0 % (ref 0.0–0.2)

## 2024-06-07 LAB — HEMOGLOBIN A1C
Hgb A1c MFr Bld: 7.2 % — ABNORMAL HIGH (ref 4.8–5.6)
Mean Plasma Glucose: 159.94 mg/dL

## 2024-06-07 LAB — GLUCOSE, CAPILLARY
Glucose-Capillary: 104 mg/dL — ABNORMAL HIGH (ref 70–99)
Glucose-Capillary: 151 mg/dL — ABNORMAL HIGH (ref 70–99)
Glucose-Capillary: 158 mg/dL — ABNORMAL HIGH (ref 70–99)
Glucose-Capillary: 164 mg/dL — ABNORMAL HIGH (ref 70–99)
Glucose-Capillary: 242 mg/dL — ABNORMAL HIGH (ref 70–99)

## 2024-06-07 LAB — FOLATE: Folate: 36 ng/mL (ref >5.9)

## 2024-06-07 MED ORDER — LACTULOSE 10 GM/15ML PO SOLN
20.0000 g | Freq: Two times a day (BID) | ORAL | Status: DC
  Administered 2024-06-07 – 2024-06-08 (×2): 20 g via ORAL
  Filled 2024-06-07 (×2): qty 30

## 2024-06-07 MED ORDER — ORAL CARE MOUTH RINSE: 15.0000 mL | OROMUCOSAL | Status: DC | PRN

## 2024-06-07 MED ORDER — SPIRONOLACTONE 25 MG PO TABS
25.0000 mg | ORAL_TABLET | Freq: Every day | ORAL | Status: DC
  Administered 2024-06-07 – 2024-06-08 (×2): 25 mg via ORAL
  Filled 2024-06-07 (×2): qty 1

## 2024-06-07 MED ORDER — IRON SUCROSE 300 MG IVPB - SIMPLE MED
300.0000 mg | Freq: Once | Status: AC
  Administered 2024-06-07: 300 mg via INTRAVENOUS
  Filled 2024-06-07: qty 300

## 2024-06-07 MED ORDER — SERTRALINE HCL 50 MG PO TABS
100.0000 mg | ORAL_TABLET | Freq: Every morning | ORAL | Status: DC
  Administered 2024-06-07 – 2024-06-08 (×2): 100 mg via ORAL
  Filled 2024-06-07 (×2): qty 2

## 2024-06-07 MED ORDER — VENLAFAXINE HCL ER 75 MG PO CP24: 150.0000 mg | ORAL_CAPSULE | Freq: Every day | ORAL | Status: DC

## 2024-06-07 MED ORDER — BENZONATATE 100 MG PO CAPS: 200.0000 mg | ORAL_CAPSULE | Freq: Three times a day (TID) | ORAL | Status: DC | PRN

## 2024-06-07 NOTE — Plan of Care (Signed)

## 2024-06-07 NOTE — Progress Notes (Signed)
*  PRELIMINARY RESULTS* Echocardiogram 2D Echocardiogram has been performed.  Laura [name] 06/07/2024, 10:43 AM

## 2024-06-07 NOTE — Progress Notes (Signed)
 PROGRESS NOTE    Laura [name]  FMW:[id number] DOB: July 24, 1951 DOA: 06/06/2024 PCP: Null Agent, MD    Brief Narrative:   73 y.o. 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 Patient notes that she has had lower extremity swelling over the last 1 week associated with shortness Of breath mostly with exertion.  She has 2 pillow orthopnea and chest discomfort. She denies having any nausea, no vomiting, no abdominal pain, no cough, no fever, no chills, no blurred vision, no urinary symptoms or focal deficit. Abnormal labs include hemoglobin of 8.1 down from 11.3 from a year ago, BNP 99 Chest x-ray reviewed by me shows Mild cardiomegaly with mild vascular congestion.  Twelve-lead EKG reviewed by me shows sinus rhythm with nonspecific ST and T wave changes. Patient received a dose of Lasix 60 mg IV in the ER and will be admitted to the hospital for further evaluation   Assessment & Plan:   Principal Problem:   Acute CHF (congestive heart failure) (HCC) Active Problems:   Iron deficiency anemia   ANXIETY DEPRESSION   Essential hypertension   Diabetes mellitus without complication (HCC)  Suspected acute congestive heart failure Ejection fraction unknown.  BNP upper limit of normal at 99.  Interstitial edema noted on chest imaging.  Two-pillow orthopnea shortness of breath progressive.  Mild cardiomegaly noted.  Trace pedal edema. Plan: Follow-up 2D echocardiogram Continue Lasix for now Strict ins and outs, daily weights Cardiology follow-up   Iron deficiency anemia Patient noted to have a drop in her hemoglobin from 11.3-8.1 Is iron deficient Plan: Hold oral iron IV iron Venofer 300 mg x 1 Will benefit from GI follow-up on discharge Per  daughter, patient has already been recommended EGD and c scope with Dr. Aundria, but she has not been able to schedule yet   ANXIETY DEPRESSION Continue venlafaxine   Essential hypertension Continue losartan  and metoprolol    Diabetes mellitus without complication (HCC) Hold oral hypoglycemic agents Sliding scale coverage Maintain consistent carbohydrate diet  Hepatic cirrhosis Continue home lactulose  Titrate to 3-4 soft BM daily     DVT prophylaxis: TED hose Code Status: Full Family Communication: Daughter Lolita [phone] Disposition Plan: Status is: Inpatient Remains inpatient appropriate because: Multiple acute issues as above   Level of care: Telemetry Cardiac  Consultants:  Cardiology-[facility]  Procedures:  None  Antimicrobials: None   Subjective: Seen and examined.  Resting comfortably in bed.  No distress.  No conversational dyspnea.  Objective: Vitals:   06/06/24 2347 06/07/24 0505 06/07/24 0743 06/07/24 1146  BP: 119/60 (!) 139/57 (!) 132/51 (!) 130/30  Pulse: 73 72 74 67  Resp: 16 18    Temp: 98.8 F (37.1 C) (!) 97.1 F (36.2 C) 97.8 F (36.6 C) (!) 97.5 F (36.4 C)  TempSrc:      SpO2: 98% 94% 93% 97%  Weight:  94.3 kg    Height:        Intake/Output Summary (Last 24 hours) at 06/07/2024 1329 Last data filed at 06/07/2024 0400 Gross per 24 hour  Intake 720 ml  Output --  Net 720 ml   Filed Weights   06/06/24 1418 06/07/24 0505  Weight: 94.3 kg 94.3 kg    Examination:  General exam: Appears calm and comfortable  Respiratory system: Crackles.  Normal work of breathing.  Room  air Cardiovascular system: S1-S2, RRR, no murmurs, trace pedal edema BLE Gastrointestinal system:, NT/ND, normal bowel sounds Central nervous system: Alert and oriented. No focal neurological deficits. Extremities: Symmetric 5 x 5 power. Skin: No rashes, lesions or ulcers Psychiatry: Judgement and insight appear normal. Mood & affect appropriate.      Data Reviewed: I have personally reviewed following labs and imaging studies  CBC: Recent Labs  Lab 06/06/24 1421 06/07/24 0344  WBC 4.2 4.7  HGB 8.1* 7.8*  HCT 26.8* 25.6*  MCV 84.5 82.3  PLT 162 154   Basic Metabolic Panel: Recent Labs  Lab 06/06/24 1421 06/07/24 0344  NA 143 142  K 4.0 3.9  CL 108 105  CO2 25 29  GLUCOSE 227* 125*  BUN 21 21  CREATININE 0.74 0.68  CALCIUM  8.8* 8.7*   GFR: Estimated Creatinine Clearance: 71.4 mL/min (by C-G formula based on SCr of 0.68 mg/dL). Liver Function Tests: Recent Labs  Lab 06/06/24 1421  AST 30  ALT 16  ALKPHOS 65  BILITOT 0.6  PROT 5.8*  ALBUMIN 3.1*   No results for input(s): LIPASE, AMYLASE in the last 168 hours. No results for input(s): AMMONIA in the last 168 hours. Coagulation Profile: No results for input(s): INR, PROTIME in the last 168 hours. Cardiac Enzymes: No results for input(s): CKTOTAL, CKMB, CKMBINDEX, TROPONINI in the last 168 hours. BNP (last 3 results) No results for input(s): PROBNP in the last 8760 hours. HbA1C: Recent Labs    06/07/24 0344  HGBA1C 7.2*   CBG: Recent Labs  Lab 06/07/24 0008 06/07/24 0744 06/07/24 1149  GLUCAP 104* 158* 242*   Lipid Profile: No results for input(s): CHOL, HDL, LDLCALC, TRIG, CHOLHDL, LDLDIRECT in the last 72 hours. Thyroid  Function Tests: No results for input(s): TSH, T4TOTAL, FREET4, T3FREE, THYROIDAB in the last 72 hours. Anemia Panel: Recent Labs    06/06/24 1421 06/07/24 1104  FOLATE  --  36.0  TIBC 468*  --   IRON 26*  --    Sepsis Labs: No results for input(s): PROCALCITON, LATICACIDVEN in the last 168 hours.  No results found for this or any previous visit (from the past 240 hours).       Radiology Studies: ECHOCARDIOGRAM COMPLETE Result Date: 06/07/2024    ECHOCARDIOGRAM REPORT   Patient Name:   Laura [name] Date of Exam: 06/07/2024 Medical Rec #:  [id number]       Height:        66.0 in Accession #:    [id number]      Weight:       207.9 lb Date of Birth:  02/19/1951        BSA:          2.033 m Patient Age:    73 years        BP:           132/51 mmHg Patient Gender: F               HR:           74 bpm. Exam Location:  [facility] Procedure: 2D Echo, Cardiac Doppler, Color Doppler, 3D Echo and Strain Analysis            (Both Spectral and Color Flow Doppler were utilized during            procedure). Indications:     CHF---acute diastolic I50.31  History:         Patient has no prior history of Echocardiogram examinations.  Risk Factors:Hypertension, Diabetes and Sleep Apnea.  Sonographer:     Christopher Furnace Referring Phys:  [id number] UNRYLXTL AGBATA Diagnosing Phys: Keller Paterson  Sonographer Comments: Global longitudinal strain was attempted. IMPRESSIONS  1. Left ventricular ejection fraction, by estimation, is 50 to 55%. The left ventricle has low normal function. There is mild left ventricular hypertrophy. Left ventricular diastolic parameters are indeterminate.  2. Right ventricular systolic function is normal. The right ventricular size is normal. There is mildly elevated pulmonary artery systolic pressure. The estimated right ventricular systolic pressure is 45.0 mmHg.  3. Left atrial size was mildly dilated.  4. The mitral valve is normal in structure. Mild mitral valve regurgitation. Mild mitral stenosis. Moderate mitral annular calcification.  5. The aortic valve is tricuspid. Aortic valve regurgitation is mild. Mild aortic valve stenosis.  6. The inferior vena cava is normal in size with greater than 50% respiratory variability, suggesting right atrial pressure of 3 mmHg. FINDINGS  Left Ventricle: Left ventricular ejection fraction, by estimation, is 50 to 55%. The left ventricle has low normal function. The left ventricular internal cavity size was normal in size. There is mild left ventricular hypertrophy. Left ventricular diastolic parameters are indeterminate.  LV  Wall Scoring: The apical septal segment and apical inferior segment are hypokinetic. The entire anterior wall, entire lateral wall, anterior septum, inferior wall, mid inferoseptal segment, basal inferoseptal segment, and apex are normal. Right Ventricle: The right ventricular size is normal. No increase in right ventricular wall thickness. Right ventricular systolic function is normal. There is mildly elevated pulmonary artery systolic pressure. The tricuspid regurgitant velocity is 3.24  m/s, and with an assumed right atrial pressure of 3 mmHg, the estimated right ventricular systolic pressure is 45.0 mmHg. Left Atrium: Left atrial size was mildly dilated. Right Atrium: Right atrial size was normal in size. Pericardium: There is no evidence of pericardial effusion. Mitral Valve: The mitral valve is normal in structure. There is mild thickening of the mitral valve leaflet(s). Moderate mitral annular calcification. Mild mitral valve regurgitation. Mild mitral valve stenosis. MV peak gradient, 11.7 mmHg. The mean mitral valve gradient is 5.0 mmHg. Tricuspid Valve: The tricuspid valve is normal in structure. Tricuspid valve regurgitation is mild. Aortic Valve: The aortic valve is tricuspid. Aortic valve regurgitation is mild. Aortic regurgitation PHT measures 632 msec. Mild aortic stenosis is present. Aortic valve mean gradient measures 18.8 mmHg. Aortic valve peak gradient measures 28.9 mmHg. Aortic valve area, by VTI measures 1.20 cm. Pulmonic Valve: The pulmonic valve was not well visualized. Pulmonic valve regurgitation is not visualized. Aorta: The aortic root and ascending aorta are structurally normal, with no evidence of dilitation. Venous: The inferior vena cava is normal in size with greater than 50% respiratory variability, suggesting right atrial pressure of 3 mmHg. IAS/Shunts: The atrial septum is grossly normal.  LEFT VENTRICLE PLAX 2D LVIDd:         5.70 cm   Diastology LVIDs:         3.60 cm   LV e'  medial:    5.55 cm/s LV PW:         1.20 cm   LV E/e' medial:  26.8 LV IVS:        1.30 cm   LV e' lateral:   8.49 cm/s LVOT diam:     2.00 cm   LV E/e' lateral: 17.6 LV SV:         78 LV SV Index:   38 LVOT Area:  3.14 cm  RIGHT VENTRICLE RV Basal diam:  3.20 cm RV Mid diam:    2.90 cm RV S prime:     16.30 cm/s TAPSE (M-mode): 3.2 cm LEFT ATRIUM             Index        RIGHT ATRIUM           Index LA diam:        3.60 cm 1.77 cm/m   RA Area:     15.20 cm LA Vol (A2C):   67.2 ml 33.05 ml/m  RA Volume:   34.80 ml  17.12 ml/m LA Vol (A4C):   63.6 ml 31.28 ml/m LA Biplane Vol: 67.5 ml 33.20 ml/m  AORTIC VALVE AV Area (Vmax):    1.01 cm AV Area (Vmean):   1.07 cm AV Area (VTI):     1.20 cm AV Vmax:           269.00 cm/s AV Vmean:          204.250 cm/s AV VTI:            0.648 m AV Peak Grad:      28.9 mmHg AV Mean Grad:      18.8 mmHg LVOT Vmax:         86.20 cm/s LVOT Vmean:        69.300 cm/s LVOT VTI:          0.247 m LVOT/AV VTI ratio: 0.38 AI PHT:            632 msec  AORTA Ao Root diam: 2.80 cm MITRAL VALVE                TRICUSPID VALVE MV Area (PHT): 3.65 cm     TR Peak grad:   42.0 mmHg MV Area VTI:   1.44 cm     TR Vmax:        324.00 cm/s MV Peak grad:  11.7 mmHg MV Mean grad:  5.0 mmHg     SHUNTS MV Vmax:       1.71 m/s     Systemic VTI:  0.25 m MV Vmean:      108.0 cm/s   Systemic Diam: 2.00 cm MV Decel Time: 208 msec MV E velocity: 149.00 cm/s MV A velocity: 152.00 cm/s MV E/A ratio:  0.98 Keller Paterson Electronically signed by Keller Paterson Signature Date/Time: 06/07/2024/12:59:30 PM    Final    DG Chest 2 View Result Date: 06/06/2024 CLINICAL DATA:  Chest pain and shortness of breath. EXAM: CHEST - 2 VIEW COMPARISON:  Chest radiograph dated 04/19/2023. FINDINGS: Mild cardiomegaly with mild vascular congestion. No focal consolidation, pleural effusion or pneumothorax. No acute osseous pathology. IMPRESSION: Mild cardiomegaly with mild vascular congestion. Electronically Signed   By:  Vanetta Chou M.D.   On: 06/06/2024 15:00        Scheduled Meds:  enoxaparin  (LOVENOX ) injection  40 mg Subcutaneous Q24H   furosemide  40 mg Intravenous BID   gabapentin   600 mg Oral QID   insulin  aspart  0-15 Units Subcutaneous TID WC   losartan   50 mg Oral Daily   metoprolol  tartrate  25 mg Oral BID   pantoprazole   40 mg Oral Daily   rosuvastatin   10 mg Oral Daily   spironolactone  25 mg Oral Daily   venlafaxine XR  150 mg Oral Daily   Continuous Infusions:  iron sucrose 300 mg (06/07/24 1205)     LOS: 1  day     Calvin KATHEE Robson, MD [facility]   If 7PM-7AM, please contact night-coverage  06/07/2024, 1:29 PM

## 2024-06-07 NOTE — Plan of Care (Signed)
 Problem: Education: Goal: Ability to describe self-care measures that may prevent or decrease complications (Diabetes Survival Skills Education) will improve 06/07/2024 2220 by Dorien Jeffry LABOR, RN Outcome: Progressing 06/07/2024 2217 by Dorien Jeffry LABOR, RN Outcome: Progressing Goal: Individualized Educational Video(s) 06/07/2024 2220 by Dorien Jeffry LABOR, RN Outcome: Progressing 06/07/2024 2217 by Dorien Jeffry A, RN Outcome: Progressing   Problem: Coping: Goal: Ability to adjust to condition or change in health will improve 06/07/2024 2220 by Dorien Jeffry A, RN Outcome: Progressing 06/07/2024 2217 by Dorien Jeffry A, RN Outcome: Progressing   Problem: Fluid Volume: Goal: Ability to maintain a balanced intake and output will improve 06/07/2024 2220 by Dorien Jeffry LABOR, RN Outcome: Progressing 06/07/2024 2217 by Dorien Jeffry A, RN Outcome: Progressing   Problem: Health Behavior/Discharge Planning: Goal: Ability to identify and utilize available resources and services will improve 06/07/2024 2220 by Dorien Jeffry LABOR, RN Outcome: Progressing 06/07/2024 2217 by Dorien Jeffry A, RN Outcome: Progressing Goal: Ability to manage health-related needs will improve 06/07/2024 2220 by Dorien Jeffry LABOR, RN Outcome: Progressing 06/07/2024 2217 by Dorien Jeffry A, RN Outcome: Progressing   Problem: Metabolic: Goal: Ability to maintain appropriate glucose levels will improve 06/07/2024 2220 by Dorien Jeffry LABOR, RN Outcome: Progressing 06/07/2024 2217 by Dorien Jeffry A, RN Outcome: Progressing   Problem: Nutritional: Goal: Maintenance of adequate nutrition will improve 06/07/2024 2220 by Dorien Jeffry LABOR, RN Outcome: Progressing 06/07/2024 2217 by Dorien Jeffry A, RN Outcome: Progressing Goal: Progress toward achieving an optimal weight will improve 06/07/2024 2220 by Dorien Jeffry LABOR, RN Outcome: Progressing 06/07/2024 2217 by Dorien Jeffry A,  RN Outcome: Progressing   Problem: Skin Integrity: Goal: Risk for impaired skin integrity will decrease 06/07/2024 2220 by Dorien Jeffry A, RN Outcome: Progressing 06/07/2024 2217 by Dorien Jeffry A, RN Outcome: Progressing   Problem: Tissue Perfusion: Goal: Adequacy of tissue perfusion will improve 06/07/2024 2220 by Dorien Jeffry LABOR, RN Outcome: Progressing 06/07/2024 2217 by Dorien Jeffry LABOR, RN Outcome: Progressing   Problem: Education: Goal: Knowledge of General Education information will improve Description: Including pain rating scale, medication(s)/side effects and non-pharmacologic comfort measures 06/07/2024 2220 by Dorien Jeffry LABOR, RN Outcome: Progressing 06/07/2024 2217 by Dorien Jeffry A, RN Outcome: Progressing   Problem: Health Behavior/Discharge Planning: Goal: Ability to manage health-related needs will improve 06/07/2024 2220 by Dorien Jeffry A, RN Outcome: Progressing 06/07/2024 2217 by Dorien Jeffry A, RN Outcome: Progressing   Problem: Clinical Measurements: Goal: Ability to maintain clinical measurements within normal limits will improve 06/07/2024 2220 by Dorien Jeffry LABOR, RN Outcome: Progressing 06/07/2024 2217 by Dorien Jeffry A, RN Outcome: Progressing Goal: Will remain free from infection 06/07/2024 2220 by Dorien Jeffry A, RN Outcome: Progressing 06/07/2024 2217 by Dorien Jeffry A, RN Outcome: Progressing Goal: Diagnostic test results will improve 06/07/2024 2220 by Dorien Jeffry LABOR, RN Outcome: Progressing 06/07/2024 2217 by Dorien Jeffry A, RN Outcome: Progressing Goal: Respiratory complications will improve 06/07/2024 2220 by Dorien Jeffry LABOR, RN Outcome: Progressing 06/07/2024 2217 by Dorien Jeffry A, RN Outcome: Progressing Goal: Cardiovascular complication will be avoided 06/07/2024 2220 by Dorien Jeffry LABOR, RN Outcome: Progressing 06/07/2024 2217 by Dorien Jeffry LABOR, RN Outcome: Progressing    Problem: Activity: Goal: Risk for activity intolerance will decrease 06/07/2024 2220 by Dorien Jeffry A, RN Outcome: Progressing 06/07/2024 2217 by Dorien Jeffry A, RN Outcome: Progressing   Problem: Nutrition: Goal: Adequate nutrition will be maintained 06/07/2024 2220 by Dorien Jeffry LABOR, RN Outcome: Progressing 06/07/2024 2217 by Dorien Jeffry LABOR, RN Outcome: Progressing  Problem: Coping: Goal: Level of anxiety will decrease 06/07/2024 2220 by Dorien Dixie A, RN Outcome: Progressing 06/07/2024 2217 by Dorien Dixie A, RN Outcome: Progressing   Problem: Elimination: Goal: Will not experience complications related to bowel motility 06/07/2024 2220 by Dorien Dixie LABOR, RN Outcome: Progressing 06/07/2024 2217 by Dorien Dixie A, RN Outcome: Progressing Goal: Will not experience complications related to urinary retention 06/07/2024 2220 by Dorien Dixie LABOR, RN Outcome: Progressing 06/07/2024 2217 by Dorien Dixie A, RN Outcome: Progressing   Problem: Pain Managment: Goal: General experience of comfort will improve and/or be controlled 06/07/2024 2220 by Dorien Dixie A, RN Outcome: Progressing 06/07/2024 2217 by Dorien Dixie A, RN Outcome: Progressing   Problem: Safety: Goal: Ability to remain free from injury will improve 06/07/2024 2220 by Dorien Dixie LABOR, RN Outcome: Progressing 06/07/2024 2217 by Dorien Dixie A, RN Outcome: Progressing   Problem: Skin Integrity: Goal: Risk for impaired skin integrity will decrease 06/07/2024 2220 by Dorien Dixie A, RN Outcome: Progressing 06/07/2024 2217 by Dorien Dixie A, RN Outcome: Progressing   Problem: Education: Goal: Ability to demonstrate management of disease process will improve 06/07/2024 2220 by Dorien Dixie LABOR, RN Outcome: Progressing 06/07/2024 2217 by Dorien Dixie A, RN Outcome: Progressing Goal: Ability to verbalize understanding of medication therapies will  improve 06/07/2024 2220 by Dorien Dixie LABOR, RN Outcome: Progressing 06/07/2024 2217 by Dorien Dixie LABOR, RN Outcome: Progressing Goal: Individualized Educational Video(s) 06/07/2024 2220 by Dorien Dixie LABOR, RN Outcome: Progressing 06/07/2024 2217 by Dorien Dixie LABOR, RN Outcome: Progressing   Problem: Activity: Goal: Capacity to carry out activities will improve 06/07/2024 2220 by Dorien Dixie A, RN Outcome: Progressing 06/07/2024 2217 by Dorien Dixie A, RN Outcome: Progressing   Problem: Cardiac: Goal: Ability to achieve and maintain adequate cardiopulmonary perfusion will improve 06/07/2024 2220 by Dorien Dixie LABOR, RN Outcome: Progressing 06/07/2024 2217 by Dorien Dixie LABOR, RN Outcome: Progressing

## 2024-06-07 NOTE — Progress Notes (Signed)
 Heart Failure Navigator Progress Note  Assessed for Heart & Vascular TOC clinic readiness.  Patient does not meet criteria due to current [facility] patient.   Navigator will sign off at this time.  Roxy Horseman, RN, BSN [facility] Heart Failure Navigator Secure Chat Only

## 2024-06-07 NOTE — Consult Note (Signed)
 [facility] CLINIC CARDIOLOGY CONSULT NOTE       Patient ID: Laura [name] MRN: [id number] DOB/AGE: June 14, 1951 73 y.o.  Admit date: 06/06/2024 Referring Physician Dr. Lanetta Primary Physician Null Agent, MD Primary Cardiologist Dr. Wilburn Reason for Consultation New CHF  HPI: Laura [name] is a 73 y.o. 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 Patient has no known history of heart failure. Cardiology was consulted for further evaluation.   Patient presented to the ED with bilateral lower extremity edema, 2 pillow orthopnea, and associated shortness of breath. Work up in the ED notable for nat 143, K 4.0, Cr 0.74, albumin 3.1, total protein 5.8, Iron 26, TIBC 468, Hgb 8.1, WBC 4.2. CXR with mild cardiomegaly and pulmonary vascular congestion. BNP at upper limit of normal at 100. Trops minimally elevated and flat 16 > 18. EKG in ED with sinus arrhthymia without acute ischemic changes. Patient received 1x IV lasix 60 mg with no documented UOP in chart.   At the time of my evaluation this AM, patient was resting comfortably in hospital bed in no acute distress. We discussed patients sxs in further detail. Patient states she's noticed worsening lower extremity swelling for the past week. Patient states she was recent;y at an outpatient vascular appointment and her provider recommended that she come to ED due to lower extremity sweling. Patient endorses SOB and orthopnea, however states these sxs do not occur daily, more intermittent. Patient states she's had SOB more awhile but denies worsening SOB. Patient endorses some chest discomfort that has been intermittent for months. Patient states currently she has mild chest discomfort on left side  of chest that worsens with deep breathing and with exertion.   Pertinent Cardiac History (Most recent) Stress test: 01/11/2024 LVEF= 54%  Regional wall motion:  reveals normal myocardial thickening and wall  motion.  The overall quality of the study is good.   Artifacts noted: no  Left ventricular cavity: normal.    Review of systems complete and found to be negative unless listed above    Past Medical History:  Diagnosis Date   Allergy    Anxiety    Asthma    Chronic back pain    Chronic cough 07/01/10   PFT  FEV1 2.20 (93%), FEV 1% 81, TLC 4,12 (81%), DLCO 78%, no BD. 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   Depression    Diabetes mellitus type 2, uncomplicated (HCC)    Diabetes mellitus without complication (HCC)    Diabetic neuropathy (HCC)    Diverticulosis    Dysrhythmia    H/O PALPITATIONS-SINCE ABLATION IN 2013 AT [facility], PALPITATIONS MUCH BETTER   GERD (gastroesophageal reflux disease)    NO MEDS   Hemorrhoids    HTN (hypertension)    NAFLD (nonalcoholic fatty liver disease) 97/88/7980   Neuropathy    Osteoarthritis    Sleep apnea    NO CPAP    Past Surgical History:  Procedure Laterality Date   BRONCHOSCOPY  07/25/10   CARDIAC ELECTROPHYSIOLOGY STUDY AND ABLATION     CARPAL TUNNEL RELEASE     CATARACT EXTRACTION Bilateral Jan 2017   cathater ablation     for arrhythmia   COLONOSCOPY  2014   COLONOSCOPY WITH PROPOFOL  N/A 09/07/2019  Procedure: COLONOSCOPY WITH PROPOFOL ;  Surgeon: Toledo, Ladell POUR, MD;  Location: [facility] ENDOSCOPY;  Service: Gastroenterology;  Laterality: N/A;   ESOPHAGOGASTRODUODENOSCOPY (EGD) WITH PROPOFOL  N/A 07/05/2018   Procedure: ESOPHAGOGASTRODUODENOSCOPY (EGD) WITH PROPOFOL ;  Surgeon: Viktoria Lamar DASEN, MD;  Location: [facility] ENDOSCOPY;  Service: Endoscopy;  Laterality: N/A;   FOOT SURGERY     HEMORROIDECTOMY     LIPOMA EXCISION Right 10/20/2016   Procedure: EXCISION LIPOMA;  Surgeon: Louanne KANDICE Muse, MD;  Location: [facility] ORS;  Service: General;  Laterality: Right;   PARTIAL HYSTERECTOMY     TOENAIL EXCISION Right     Medications Prior to Admission  Medication Sig Dispense Refill Last Dose/Taking   benzonatate  (TESSALON ) 200 MG capsule Take 200 mg by mouth 3 (three) times daily as needed for cough.   Unknown   CONSTULOSE  10 GM/15ML solution Take 20 g by mouth 2 (two) times daily.   06/06/2024   ferrous sulfate  325 (65 FE) MG tablet Take 1 tablet (325 mg total) by mouth daily with breakfast. 30 tablet 2 06/06/2024   gabapentin  (NEURONTIN ) 300 MG capsule TAKE 2 CAPSULES BY MOUTH 4 TIMES DAILY (Patient taking differently: Take 600 mg by mouth 4 (four) times daily. TAKE 3 CAPSULES BY MOUTH TWICE TIMES DAILY) 240 capsule 0 06/06/2024   glipiZIDE  (GLUCOTROL  XL) 5 MG 24 hr tablet Take 5-10 mg by mouth 2 (two) times daily. Take 10 mg every morning and 5 mg every evening   06/06/2024   hydrochlorothiazide  (HYDRODIURIL ) 25 MG tablet Take 25 mg by mouth daily.   06/06/2024   losartan  (COZAAR ) 50 MG tablet Take 50 mg by mouth daily.   06/06/2024   metFORMIN  (GLUCOPHAGE ) 500 MG tablet Take 500 mg by mouth 2 (two) times daily.   06/06/2024   NOVOLOG  FLEXPEN 100 UNIT/ML FlexPen Inject 0-60 Units into the skin 3 (three) times daily with meals. 60 units every morning and Sliding Scale for lunch and supper (0-24 units)   06/06/2024   pantoprazole  (PROTONIX ) 40 MG tablet Take 1 tablet (40 mg total) by mouth daily. 30 tablet 2 06/06/2024   rosuvastatin  (CRESTOR ) 10 MG tablet Take 10 mg by mouth daily.   06/06/2024   sertraline (ZOLOFT) 100 MG tablet Take 100 mg by mouth every morning.   06/06/2024   venlafaxine XR (EFFEXOR-XR) 150 MG 24 hr capsule Take 150 mg by mouth daily.   06/05/2024   clonazePAM  (KLONOPIN ) 1 MG tablet Take 1 mg by mouth 2 (two) times daily. (Patient not taking: Reported on 06/06/2024)   Not Taking   Social History   Socioeconomic History   Marital status: Widowed    Spouse name: Not on file    Number of children: Not on file   Years of education: Not on file   Highest education level: Not on file  Occupational History   Not on file  Tobacco Use   Smoking status: Never   Smokeless tobacco: Never  Vaping Use   Vaping status: Never Used  Substance and Sexual Activity   Alcohol use: No    Alcohol/week: 0.0 standard drinks of alcohol   Drug use: No   Sexual activity: Not on file  Other Topics Concern   Not on file  Social History Narrative   Married, 2 children. Works in day care center   Cell: [phone]    Social Drivers of Health   Financial Resource Strain: Medium Risk (04/25/2024)   Received from [facility]   Overall Financial Resource Strain (CARDIA)  Difficulty of Paying Living Expenses: Somewhat hard  Food Insecurity: No Food Insecurity (06/06/2024)   Hunger Vital Sign    Worried About Running Out of Food in the Last Year: Never true    Ran Out of Food in the Last Year: Never true  Recent Concern: Food Insecurity - Food Insecurity Present (04/25/2024)   Received from [facility]   Hunger Vital Sign    Within the past 12 months, you worried that your food would run out before you got the money to buy more.: Sometimes true    Within the past 12 months, the food you bought just didn't last and you didn't have money to get more.: Sometimes true  Transportation Needs: No Transportation Needs (06/06/2024)   PRAPARE - Administrator, Civil Service (Medical): No    Lack of Transportation (Non-Medical): No  Recent Concern: Transportation Needs - Unmet Transportation Needs (04/25/2024)   Received from [facility] - Transportation    In the past 12 months, has lack of transportation kept you from medical appointments or from getting medications?: Yes    Lack of Transportation (Non-Medical): No  Physical Activity: Not on file  Stress: Not on file  Social Connections: Socially Isolated  (06/06/2024)   Social Connection and Isolation Panel    Frequency of Communication with Friends and Family: Never    Frequency of Social Gatherings with Friends and Family: Never    Attends Religious Services: 1 to 4 times per year    Active Member of [employer] or Organizations: No    Attends Banker Meetings: Never    Marital Status: Widowed  Intimate Partner Violence: Not At Risk (06/06/2024)   Humiliation, Afraid, Rape, and Kick questionnaire    Fear of Current or Ex-Partner: No    Emotionally Abused: No    Physically Abused: No    Sexually Abused: No    Family History  Problem Relation Age of Onset   Cancer Mother        stomach   Diabetes Mother    Cancer Father        brain   Heart disease Father    Cancer Brother        mouth   Heart disease Brother    Ovarian cancer Maternal Grandmother      Vitals:   06/06/24 2347 06/07/24 0505 06/07/24 0743 06/07/24 1146  BP: 119/60 (!) 139/57 (!) 132/51 (!) 130/30  Pulse: 73 72 74 67  Resp: 16 18    Temp: 98.8 F (37.1 C) (!) 97.1 F (36.2 C) 97.8 F (36.6 C) (!) 97.5 F (36.4 C)  TempSrc:      SpO2: 98% 94% 93% 97%  Weight:  94.3 kg    Height:        PHYSICAL EXAM General: Well appearing elderly female, well nourished, in no acute distress. HEENT: Normocephalic and atraumatic. Neck: No JVD.   Lungs: Normal respiratory effort on room air. Diminished breath sounds bilaterally. Heart: HRRR. Normal S1 and S2 without gallops or murmurs.  Abdomen: Non-distended appearing.  Msk: Normal strength and tone for age. Extremities: Warm and well perfused. No clubbing, cyanosis. 1+ pedal edema.  Neuro: Alert and oriented X 3. Psych: Answers questions appropriately.   Labs: Basic Metabolic Panel: Recent Labs    06/06/24 1421 06/07/24 0344  NA 143 142  K 4.0 3.9  CL 108 105  CO2 25 29  GLUCOSE 227* 125*  BUN 21  21  CREATININE 0.74 0.68  CALCIUM  8.8* 8.7*   Liver Function Tests: Recent Labs    06/06/24 1421   AST 30  ALT 16  ALKPHOS 65  BILITOT 0.6  PROT 5.8*  ALBUMIN 3.1*   No results for input(s): LIPASE, AMYLASE in the last 72 hours. CBC: Recent Labs    06/06/24 1421 06/07/24 0344  WBC 4.2 4.7  HGB 8.1* 7.8*  HCT 26.8* 25.6*  MCV 84.5 82.3  PLT 162 154   Cardiac Enzymes: Recent Labs    06/06/24 1421 06/06/24 1613  TROPONINIHS 16 18*   BNP: Recent Labs    06/06/24 1421  BNP 99.9   D-Dimer: No results for input(s): DDIMER in the last 72 hours. Hemoglobin A1C: Recent Labs    06/07/24 0344  HGBA1C 7.2*   Fasting Lipid Panel: No results for input(s): CHOL, HDL, LDLCALC, TRIG, CHOLHDL, LDLDIRECT in the last 72 hours. Thyroid  Function Tests: No results for input(s): TSH, T4TOTAL, T3FREE, THYROIDAB in the last 72 hours.  Invalid input(s): FREET3 Anemia Panel: Recent Labs    06/06/24 1421 06/07/24 1104  FOLATE  --  36.0  TIBC 468*  --   IRON 26*  --      Radiology: ECHOCARDIOGRAM COMPLETE Result Date: 06/07/2024    ECHOCARDIOGRAM REPORT   Patient Name:   Laura [name] Date of Exam: 06/07/2024 Medical Rec #:  [id number]       Height:       66.0 in Accession #:    [id number]      Weight:       207.9 lb Date of Birth:  28-Jan-1951        BSA:          2.033 m Patient Age:    73 years        BP:           132/51 mmHg Patient Gender: F               HR:           74 bpm. Exam Location:  [facility] Procedure: 2D Echo, Cardiac Doppler, Color Doppler, 3D Echo and Strain Analysis            (Both Spectral and Color Flow Doppler were utilized during            procedure). Indications:     CHF---acute diastolic I50.31  History:         Patient has no prior history of Echocardiogram examinations.                  Risk Factors:Hypertension, Diabetes and Sleep Apnea.  Sonographer:     Christopher Furnace Referring Phys:  [id number] UNRYLXTL AGBATA Diagnosing Phys: Keller Paterson  Sonographer Comments: Global longitudinal strain was attempted. IMPRESSIONS  1. Left ventricular  ejection fraction, by estimation, is 50 to 55%. The left ventricle has low normal function. There is mild left ventricular hypertrophy. Left ventricular diastolic parameters are indeterminate.  2. Right ventricular systolic function is normal. The right ventricular size is normal. There is mildly elevated pulmonary artery systolic pressure. The estimated right ventricular systolic pressure is 45.0 mmHg.  3. Left atrial size was mildly dilated.  4. The mitral valve is normal in structure. Mild mitral valve regurgitation. Mild mitral stenosis. Moderate mitral annular calcification.  5. The aortic valve is tricuspid. Aortic valve regurgitation is mild. Mild aortic valve stenosis.  6. The inferior vena cava is normal in size with greater than  50% respiratory variability, suggesting right atrial pressure of 3 mmHg. FINDINGS  Left Ventricle: Left ventricular ejection fraction, by estimation, is 50 to 55%. The left ventricle has low normal function. The left ventricular internal cavity size was normal in size. There is mild left ventricular hypertrophy. Left ventricular diastolic parameters are indeterminate.  LV Wall Scoring: The apical septal segment and apical inferior segment are hypokinetic. The entire anterior wall, entire lateral wall, anterior septum, inferior wall, mid inferoseptal segment, basal inferoseptal segment, and apex are normal. Right Ventricle: The right ventricular size is normal. No increase in right ventricular wall thickness. Right ventricular systolic function is normal. There is mildly elevated pulmonary artery systolic pressure. The tricuspid regurgitant velocity is 3.24  m/s, and with an assumed right atrial pressure of 3 mmHg, the estimated right ventricular systolic pressure is 45.0 mmHg. Left Atrium: Left atrial size was mildly dilated. Right Atrium: Right atrial size was normal in size. Pericardium: There is no evidence of pericardial effusion. Mitral Valve: The mitral valve is normal in  structure. There is mild thickening of the mitral valve leaflet(s). Moderate mitral annular calcification. Mild mitral valve regurgitation. Mild mitral valve stenosis. MV peak gradient, 11.7 mmHg. The mean mitral valve gradient is 5.0 mmHg. Tricuspid Valve: The tricuspid valve is normal in structure. Tricuspid valve regurgitation is mild. Aortic Valve: The aortic valve is tricuspid. Aortic valve regurgitation is mild. Aortic regurgitation PHT measures 632 msec. Mild aortic stenosis is present. Aortic valve mean gradient measures 18.8 mmHg. Aortic valve peak gradient measures 28.9 mmHg. Aortic valve area, by VTI measures 1.20 cm. Pulmonic Valve: The pulmonic valve was not well visualized. Pulmonic valve regurgitation is not visualized. Aorta: The aortic root and ascending aorta are structurally normal, with no evidence of dilitation. Venous: The inferior vena cava is normal in size with greater than 50% respiratory variability, suggesting right atrial pressure of 3 mmHg. IAS/Shunts: The atrial septum is grossly normal.  LEFT VENTRICLE PLAX 2D LVIDd:         5.70 cm   Diastology LVIDs:         3.60 cm   LV e' medial:    5.55 cm/s LV PW:         1.20 cm   LV E/e' medial:  26.8 LV IVS:        1.30 cm   LV e' lateral:   8.49 cm/s LVOT diam:     2.00 cm   LV E/e' lateral: 17.6 LV SV:         78 LV SV Index:   38 LVOT Area:     3.14 cm  RIGHT VENTRICLE RV Basal diam:  3.20 cm RV Mid diam:    2.90 cm RV S prime:     16.30 cm/s TAPSE (M-mode): 3.2 cm LEFT ATRIUM             Index        RIGHT ATRIUM           Index LA diam:        3.60 cm 1.77 cm/m   RA Area:     15.20 cm LA Vol (A2C):   67.2 ml 33.05 ml/m  RA Volume:   34.80 ml  17.12 ml/m LA Vol (A4C):   63.6 ml 31.28 ml/m LA Biplane Vol: 67.5 ml 33.20 ml/m  AORTIC VALVE AV Area (Vmax):    1.01 cm AV Area (Vmean):   1.07 cm AV Area (VTI):     1.20 cm AV  Vmax:           269.00 cm/s AV Vmean:          204.250 cm/s AV VTI:            0.648 m AV Peak Grad:      28.9  mmHg AV Mean Grad:      18.8 mmHg LVOT Vmax:         86.20 cm/s LVOT Vmean:        69.300 cm/s LVOT VTI:          0.247 m LVOT/AV VTI ratio: 0.38 AI PHT:            632 msec  AORTA Ao Root diam: 2.80 cm MITRAL VALVE                TRICUSPID VALVE MV Area (PHT): 3.65 cm     TR Peak grad:   42.0 mmHg MV Area VTI:   1.44 cm     TR Vmax:        324.00 cm/s MV Peak grad:  11.7 mmHg MV Mean grad:  5.0 mmHg     SHUNTS MV Vmax:       1.71 m/s     Systemic VTI:  0.25 m MV Vmean:      108.0 cm/s   Systemic Diam: 2.00 cm MV Decel Time: 208 msec MV E velocity: 149.00 cm/s MV A velocity: 152.00 cm/s MV E/A ratio:  0.98 Keller Paterson Electronically signed by Keller Paterson Signature Date/Time: 06/07/2024/12:59:30 PM    Final    DG Chest 2 View Result Date: 06/06/2024 CLINICAL DATA:  Chest pain and shortness of breath. EXAM: CHEST - 2 VIEW COMPARISON:  Chest radiograph dated 04/19/2023. FINDINGS: Mild cardiomegaly with mild vascular congestion. No focal consolidation, pleural effusion or pneumothorax. No acute osseous pathology. IMPRESSION: Mild cardiomegaly with mild vascular congestion. Electronically Signed   By: Vanetta Chou M.D.   On: 06/06/2024 15:00    ECHO pending  TELEMETRY reviewed by me 06/07/2024: sinus rhythm, rate 70s  EKG reviewed by me: sinus arrhthymia, rate 84 bpm with non specific ST-T wave changes  Data reviewed by me 06/07/2024: last 24h vitals tele labs imaging I/O ED provider note, admission H&P.  Principal Problem:   Acute CHF (congestive heart failure) (HCC) Active Problems:   ANXIETY DEPRESSION   Essential hypertension   Iron deficiency anemia   Diabetes mellitus without complication (HCC)    ASSESSMENT AND PLAN:  Laura [name] is a 73 y.o. 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 Patient has no known history of heart failure. Cardiology was consulted for further evaluation.   # New Congestive Heart Failure?  Patient presents with lower extremity edema and intermittent SOB, orthopnea. CXR with mild cardiomegaly and pulmonary vascular congestion. BNP at upper limit of normal at 100. Echo this admission with pEF (50-55%), low normal LV function, mild LVH, mildly elevated PASP, mild MR, mild MS with moderate mitral annular calcification, apical septal segment, apical inferior segment are hypokinetic.  -Monitor and replenish electrolytes for a goal K >4, Mag >2  -Continue IV lasix 40 mg twice daily. Closely monitor renal function and UOP. -Ordered spironolactone 25 mg daily.  -Continue home losartan  50 mg daily. -Continue metoprolol  tartrate 25 mg twice  daily.   # Coronary artery disease (per CCTA 04/2023) # Hypertension # Hyperlipidemia Patient without worsening chest discomfort. Stress test on 01/2024 negative for ischemia and preserved EF. Trops minimally elevated and flat 16 > 18. EKG in ED with sinus arrhthymia without acute ischemic changes.  -losartan , metoprolol  as stated above.  -Continue rosuvastatin  10 mg daily.  -Minimally elevated and flat trops is most consistent with demand/supply mismatch and not ACS   # Nonalcoholic fatty liver disease  # Liver cirrhosis Abdomen US  from this admission with evidence of cirrhotic morphology of the liver, these finding also on US  on 07/2022. -Management per primary team.    This patient's plan of care was discussed and created with Dr. Federico and he is in agreement.  Signed: Dorene Comfort, PA-C  06/07/2024, 2:01 PM [facility]

## 2024-06-07 NOTE — Plan of Care (Signed)
  Problem: Coping: Goal: Ability to adjust to condition or change in health will improve Outcome: Progressing   Problem: Health Behavior/Discharge Planning: Goal: Ability to identify and utilize available resources and services will improve Outcome: Progressing   Problem: Nutritional: Goal: Maintenance of adequate nutrition will improve Outcome: Progressing

## 2024-06-07 NOTE — Telephone Encounter (Signed)
 Patient Product/process development scientist completed.    The patient is insured through [employer]. Patient has Medicare and is not eligible for a copay card, but may be able to apply for patient assistance or Medicare RX Payment Plan (Patient Must reach out to their plan, if eligible for payment plan), if available.    Ran test claim for Farxiga 10 mg and the current 30 day co-pay is $282.02.  Ran test claim for Jardiance 10 mg and the current 30 day co-pay is $47.00.  This test claim was processed through [facility]- copay amounts may vary at other pharmacies due to pharmacy/plan contracts, or as the patient moves through the different stages of their insurance plan.     Reyes Sharps, CPHT Pharmacy Technician III Certified Patient Advocate [facility] Patient Advocate Team Direct Number: [phone]  Fax: [phone]

## 2024-06-07 NOTE — Progress Notes (Incomplete)
 Heart Failure Stewardship Pharmacy Note  PCP: Valora Agent, MD PCP-Cardiologist: None  HPI: Laura [name] is a 73 y.o. 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 On admission, BNP was 99.9, HS-troponin was 16, and TSAT was 6 (no ferritin). Chest x-ray noted mild vascular congestion.   Pertinent cardiac history: Echo in 06/2022 with LVEF >55% and mild LVH. Stress test in 20/2025 was negative for ischemia with LVEF 54%.   Pertinent Lab Values: Creatinine, Ser  Date Value Ref Range Status  06/07/2024 0.68 0.44 - 1.00 mg/dL Final   BUN  Date Value Ref Range Status  06/07/2024 21 8 - 23 mg/dL Final   Potassium  Date Value Ref Range Status  06/07/2024 3.9 3.5 - 5.1 mmol/L Final   Sodium  Date Value Ref Range Status  06/07/2024 142 135 - 145 mmol/L Final   B Natriuretic Peptide  Date Value Ref Range Status  06/06/2024 99.9 0.0 - 100.0 pg/mL Final    Comment:    Performed at [facility], [address] [place], [place] [address]   Magnesium   Date Value Ref Range Status  04/29/2021 1.9 1.7 - 2.4 mg/dL Final    Comment:    Performed at [facility], [address] [place], [place] [address]   Hgb A1c MFr Bld  Date Value Ref Range Status  04/27/2021 11.9 (H) 4.8 - 5.6 % Final    Comment:    (NOTE) Pre diabetes:          5.7%-6.4%  Diabetes:              >6.4%  Glycemic control for   <7.0% adults with diabetes    TSH  Date Value Ref Range Status  04/19/2023 2.620 0.350 - 4.500 uIU/mL Final    Comment:    Performed by a 3rd Generation assay with a functional sensitivity of <=0.01 uIU/mL. Performed at [facility], [address] [place], [place] [address]    LDH  Date Value Ref Range Status   04/27/2021 134 98 - 192 U/L Final    Comment:    Performed at [facility], [address] [place], [place] [address]    Vital Signs:  Temp:  [97.1 F (36.2 C)-98.8 F (37.1 C)] 97.8 F (36.6 C) (07/01 0743) Pulse Rate:  [66-96] 74 (07/01 0743) Cardiac Rhythm: Normal sinus rhythm (07/01 0738) Resp:  [12-20] 18 (07/01 0505) BP: (108-144)/(51-72) 132/51 (07/01 0743) SpO2:  [93 %-100 %] 93 % (07/01 0743) Weight:  [94.3 kg (207 lb 14.3 oz)-94.9 kg (209 lb 4 oz)] 94.3 kg (207 lb 14.3 oz) (07/01 0505)  Intake/Output Summary (Last 24 hours) at 06/07/2024 0917 Last data filed at 06/07/2024 0400 Gross per 24 hour  Intake 720 ml  Output --  Net 720 ml    Current Heart Failure Medications:  Loop diuretic: Beta-Blocker: ACEI/ARB/ARNI: MRA: SGLT2i: Other:  Prior to admission Heart Failure Medications:  Loop diuretic: none Beta-Blocker: none ACEI/ARB/ARNI: losartan  50 mg daily MRA: none SGLT2i: none Other: hydrochlorothiazide   Assessment: 1. {CHL AMB Acute or Acute on chronic:210917277} {Systolic/diastolic/combined systolic/diastolic:210917278} heart failure (LVEF ***%) ***, due to ***. NYHA class *** symptoms.   - Plan: 1) Medication changes recommended at this time:  2) Patient assistance:   3) Education: -To be completed prior to discharge.  *** Medication  Assistance / Insurance Benefits Check: Does the patient have prescription insurance?    Type of insurance plan:  Does the patient qualify for medication assistance through manufacturers or grants? {CHL AMB Yes/No/Pending:210917269}  Eligible grants and/or patient assistance programs: ***  Medication assistance applications in progress: ***  Medication assistance applications approved: *** Approved medication assistance renewals will be completed by: ***  Outpatient Pharmacy: Prior to admission outpatient pharmacy: ***      ***

## 2024-06-08 ENCOUNTER — Other Ambulatory Visit: Payer: Self-pay

## 2024-06-08 DIAGNOSIS — E119 Type 2 diabetes mellitus without complications: Secondary | ICD-10-CM | POA: Diagnosis not present

## 2024-06-08 DIAGNOSIS — I10 Essential (primary) hypertension: Secondary | ICD-10-CM | POA: Diagnosis not present

## 2024-06-08 DIAGNOSIS — D508 Other iron deficiency anemias: Secondary | ICD-10-CM

## 2024-06-08 DIAGNOSIS — I5031 Acute diastolic (congestive) heart failure: Secondary | ICD-10-CM | POA: Diagnosis not present

## 2024-06-08 LAB — BASIC METABOLIC PANEL WITH GFR
Anion gap: 8 (ref 5–15)
BUN: 21 mg/dL (ref 8–23)
CO2: 30 mmol/L (ref 22–32)
Calcium: 8.6 mg/dL — ABNORMAL LOW (ref 8.9–10.3)
Chloride: 103 mmol/L (ref 98–111)
Creatinine, Ser: 0.75 mg/dL (ref 0.44–1.00)
GFR, Estimated: >60 mL/min (ref >60)
Glucose, Bld: 138 mg/dL — ABNORMAL HIGH (ref 70–99)
Potassium: 3.6 mmol/L (ref 3.5–5.1)
Sodium: 141 mmol/L (ref 135–145)

## 2024-06-08 LAB — CBC
HCT: 26.6 % — ABNORMAL LOW (ref 36.0–46.0)
Hemoglobin: 8.1 g/dL — ABNORMAL LOW (ref 12.0–15.0)
MCH: 25 pg — ABNORMAL LOW (ref 26.0–34.0)
MCHC: 30.5 g/dL (ref 30.0–36.0)
MCV: 82.1 fL (ref 80.0–100.0)
Platelets: 170 10*3/uL (ref 150–400)
RBC: 3.24 MIL/uL — ABNORMAL LOW (ref 3.87–5.11)
RDW: 14.5 % (ref 11.5–15.5)
WBC: 4.4 10*3/uL (ref 4.0–10.5)
nRBC: 0 % (ref 0.0–0.2)

## 2024-06-08 LAB — GLUCOSE, CAPILLARY
Glucose-Capillary: 156 mg/dL — ABNORMAL HIGH (ref 70–99)
Glucose-Capillary: 217 mg/dL — ABNORMAL HIGH (ref 70–99)

## 2024-06-08 MED ORDER — FUROSEMIDE 40 MG PO TABS
40.0000 mg | ORAL_TABLET | Freq: Every day | ORAL | 2 refills | Status: AC
  Filled 2024-06-08: qty 30, 30d supply, fill #0

## 2024-06-08 MED ORDER — EMPAGLIFLOZIN 10 MG PO TABS
10.0000 mg | ORAL_TABLET | Freq: Every day | ORAL | 0 refills | Status: AC
  Filled 2024-06-08: qty 30, 30d supply, fill #0

## 2024-06-08 MED ORDER — METOPROLOL TARTRATE 25 MG PO TABS
25.0000 mg | ORAL_TABLET | Freq: Two times a day (BID) | ORAL | 0 refills | Status: AC
  Filled 2024-06-08: qty 60, 30d supply, fill #0

## 2024-06-08 MED ORDER — FERROUS SULFATE 325 (65 FE) MG PO TABS
325.0000 mg | ORAL_TABLET | Freq: Every day | ORAL | 2 refills | Status: AC
  Filled 2024-06-08: qty 30, 30d supply, fill #0

## 2024-06-08 MED ORDER — SPIRONOLACTONE 25 MG PO TABS
25.0000 mg | ORAL_TABLET | Freq: Every day | ORAL | 0 refills | Status: AC
  Filled 2024-06-08: qty 30, 30d supply, fill #0

## 2024-06-08 MED ORDER — GLIPIZIDE ER 5 MG PO TB24
5.0000 mg | ORAL_TABLET | Freq: Two times a day (BID) | ORAL | 0 refills | Status: DC
  Filled 2024-06-08: qty 60, 30d supply, fill #0

## 2024-06-08 MED ORDER — POTASSIUM CHLORIDE CRYS ER 20 MEQ PO TBCR
40.0000 meq | EXTENDED_RELEASE_TABLET | Freq: Once | ORAL | Status: AC
  Administered 2024-06-08: 40 meq via ORAL
  Filled 2024-06-08: qty 2

## 2024-06-08 MED ORDER — EMPAGLIFLOZIN 10 MG PO TABS
10.0000 mg | ORAL_TABLET | Freq: Every day | ORAL | Status: DC
  Administered 2024-06-08: 10 mg via ORAL
  Filled 2024-06-08: qty 1

## 2024-06-08 MED ORDER — GLIPIZIDE 10 MG PO TABS
10.0000 mg | ORAL_TABLET | Freq: Every day | ORAL | 0 refills | Status: AC
  Filled 2024-06-08: qty 30, 30d supply, fill #0

## 2024-06-08 MED ORDER — LACTULOSE 10 GM/15ML PO SOLN
20.0000 g | Freq: Two times a day (BID) | ORAL | 0 refills | Status: AC
  Filled 2024-06-08: qty 1419, 24d supply, fill #0

## 2024-06-08 MED ORDER — GABAPENTIN 300 MG PO CAPS
600.0000 mg | ORAL_CAPSULE | Freq: Four times a day (QID) | ORAL | 0 refills | Status: AC
  Filled 2024-06-08: qty 240, 30d supply, fill #0

## 2024-06-08 NOTE — TOC CM/SW Note (Signed)
 Transition of Care [facility]) - Inpatient Brief Assessment   Patient Details  Name: RANIAH KARAN MRN: [id number] Date of Birth: [birth year]  Transition of Care [facility]) CM/SW Contact:    Lauraine JAYSON Carpen, LCSW Phone Number: 06/08/2024, 9:25 AM   Clinical Narrative: CSW reviewed chart. SDOH flag for social isolation. Added resources to AVS. No other TOC needs identified so far. CSW will continue to follow progress. Please place [facility] consult if any needs arise.  Transition of Care Asessment: Insurance and Status: Insurance coverage has been reviewed Patient has primary care physician: Yes Home environment has been reviewed: Single family home Prior level of function:: Not documented Prior/Current Home Services: No current home services Social Drivers of Health Review: SDOH reviewed interventions complete Readmission risk has been reviewed: Yes Transition of care needs: no transition of care needs at this time

## 2024-06-08 NOTE — Discharge Instructions (Signed)

## 2024-06-08 NOTE — Progress Notes (Addendum)
 [facility] CLINIC CARDIOLOGY PROGRESS NOTE       Patient ID: Laura [name] MRN: [id number] DOB/AGE: [birth year] 73 y.o.  Admit date: 06/06/2024 Referring Physician Dr. Lanetta Primary Physician Null Agent, MD Primary Cardiologist Dr. Wilburn Reason for Consultation New CHF  HPI: Laura [name] is a 73 y.o. 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 Patient has no known history of heart failure. Cardiology was consulted for further evaluation.   Interval History: -Patient seen and examined this AM and sitting up eating breakfast. Patient states she feels good this AM with much improved SOB.  -Patients BP and HR stable this AM. Overnight Tele showed no significant events.  -No documented UOP, patient reports good UOP and wt is down since admission. Renal function is stable. -Electrolytes are stable. -Patient remains on room air with stable SpO2.    Pertinent Cardiac History (Most recent) Stress test: 01/11/2024 LVEF= 54%  Regional wall motion:  reveals normal myocardial thickening and wall  motion.  The overall quality of the study is good.   Artifacts noted: no  Left ventricular cavity: normal.    Review of systems complete and found to be negative unless listed above    Past Medical History:  Diagnosis Date   Allergy    Anxiety    Asthma    Chronic back pain    Chronic cough 07/01/10   PFT  FEV1 2.20 (93%), FEV 1% 81, TLC 4,12 (81%), DLCO 78%, no BD. 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   Depression    Diabetes mellitus type 2, uncomplicated (HCC)    Diabetes mellitus without complication (HCC)    Diabetic neuropathy (HCC)    Diverticulosis    Dysrhythmia    H/O  PALPITATIONS-SINCE ABLATION IN 2013 AT [facility], PALPITATIONS MUCH BETTER   GERD (gastroesophageal reflux disease)    NO MEDS   Hemorrhoids    HTN (hypertension)    NAFLD (nonalcoholic fatty liver disease) 97/88/7980   Neuropathy    Osteoarthritis    Sleep apnea    NO CPAP    Past Surgical History:  Procedure Laterality Date   BRONCHOSCOPY  07/25/10   CARDIAC ELECTROPHYSIOLOGY STUDY AND ABLATION     CARPAL TUNNEL RELEASE     CATARACT EXTRACTION Bilateral Jan 2017   cathater ablation     for arrhythmia   COLONOSCOPY  2014   COLONOSCOPY WITH PROPOFOL  N/A 09/07/2019   Procedure: COLONOSCOPY WITH PROPOFOL ;  Surgeon: Toledo, Ladell POUR, MD;  Location: [facility] ENDOSCOPY;  Service: Gastroenterology;  Laterality: N/A;   ESOPHAGOGASTRODUODENOSCOPY (EGD) WITH PROPOFOL  N/A 07/05/2018   Procedure: ESOPHAGOGASTRODUODENOSCOPY (EGD) WITH PROPOFOL ;  Surgeon: Viktoria Lamar DASEN, MD;  Location: [facility] ENDOSCOPY;  Service: Endoscopy;  Laterality: N/A;   FOOT SURGERY     HEMORROIDECTOMY     LIPOMA EXCISION Right 10/20/2016   Procedure: EXCISION LIPOMA;  Surgeon: Louanne KANDICE Muse, MD;  Location: [facility] ORS;  Service: General;  Laterality: Right;   PARTIAL HYSTERECTOMY     TOENAIL EXCISION Right     Medications Prior to Admission  Medication Sig Dispense Refill Last Dose/Taking   benzonatate  (TESSALON ) 200 MG capsule Take 200 mg by mouth 3 (three) times daily as needed for cough.   Unknown   CONSTULOSE  10 GM/15ML solution Take  20 g by mouth 2 (two) times daily.   06/06/2024   ferrous sulfate  325 (65 FE) MG tablet Take 1 tablet (325 mg total) by mouth daily with breakfast. 30 tablet 2 06/06/2024   gabapentin  (NEURONTIN ) 300 MG capsule TAKE 2 CAPSULES BY MOUTH 4 TIMES DAILY (Patient taking differently: Take 600 mg by mouth 4 (four) times daily. TAKE 3 CAPSULES BY MOUTH TWICE TIMES DAILY) 240 capsule 0 06/06/2024   glipiZIDE  (GLUCOTROL  XL) 5 MG 24 hr tablet Take 5-10 mg by mouth 2 (two) times daily. Take 10 mg  every morning and 5 mg every evening   06/06/2024   hydrochlorothiazide  (HYDRODIURIL ) 25 MG tablet Take 25 mg by mouth daily.   06/06/2024   losartan  (COZAAR ) 50 MG tablet Take 50 mg by mouth daily.   06/06/2024   metFORMIN  (GLUCOPHAGE ) 500 MG tablet Take 500 mg by mouth 2 (two) times daily.   06/06/2024   NOVOLOG  FLEXPEN 100 UNIT/ML FlexPen Inject 0-60 Units into the skin 3 (three) times daily with meals. 60 units every morning and Sliding Scale for lunch and supper (0-24 units)   06/06/2024   pantoprazole  (PROTONIX ) 40 MG tablet Take 1 tablet (40 mg total) by mouth daily. 30 tablet 2 06/06/2024   rosuvastatin  (CRESTOR ) 10 MG tablet Take 10 mg by mouth daily.   06/06/2024   sertraline (ZOLOFT) 100 MG tablet Take 100 mg by mouth every morning.   06/06/2024   clonazePAM  (KLONOPIN ) 1 MG tablet Take 1 mg by mouth 2 (two) times daily. (Patient not taking: Reported on 06/06/2024)   Not Taking   venlafaxine XR (EFFEXOR-XR) 150 MG 24 hr capsule Take 150 mg by mouth daily. (Patient not taking: Reported on 06/07/2024)   Not Taking   Social History   Socioeconomic History   Marital status: Widowed    Spouse name: Not on file   Number of children: Not on file   Years of education: Not on file   Highest education level: Not on file  Occupational History   Not on file  Tobacco Use   Smoking status: Never   Smokeless tobacco: Never  Vaping Use   Vaping status: Never Used  Substance and Sexual Activity   Alcohol use: No    Alcohol/week: 0.0 standard drinks of alcohol   Drug use: No   Sexual activity: Not on file  Other Topics Concern   Not on file  Social History Narrative   Married, 2 children. Works in day care center   Cell: [phone]    Social Drivers of Health   Financial Resource Strain: Medium Risk (04/25/2024)   Received from [facility]   Overall Financial Resource Strain (CARDIA)    Difficulty of Paying Living Expenses: Somewhat hard  Food Insecurity: No Food Insecurity  (06/06/2024)   Hunger Vital Sign    Worried About Running Out of Food in the Last Year: Never true    Ran Out of Food in the Last Year: Never true  Recent Concern: Food Insecurity - Food Insecurity Present (04/25/2024)   Received from [facility]   Hunger Vital Sign    Within the past 12 months, you worried that your food would run out before you got the money to buy more.: Sometimes true    Within the past 12 months, the food you bought just didn't last and you didn't have money to get more.: Sometimes true  Transportation Needs: No Transportation Needs (06/06/2024)   PRAPARE - Transportation  Lack of Transportation (Medical): No    Lack of Transportation (Non-Medical): No  Recent Concern: Transportation Needs - Unmet Transportation Needs (04/25/2024)   Received from [facility] - Transportation    In the past 12 months, has lack of transportation kept you from medical appointments or from getting medications?: Yes    Lack of Transportation (Non-Medical): No  Physical Activity: Not on file  Stress: Not on file  Social Connections: Socially Isolated (06/06/2024)   Social Connection and Isolation Panel    Frequency of Communication with Friends and Family: Never    Frequency of Social Gatherings with Friends and Family: Never    Attends Religious Services: 1 to 4 times per year    Active Member of [employer] or Organizations: No    Attends Banker Meetings: Never    Marital Status: Widowed  Intimate Partner Violence: Not At Risk (06/06/2024)   Humiliation, Afraid, Rape, and Kick questionnaire    Fear of Current or Ex-Partner: No    Emotionally Abused: No    Physically Abused: No    Sexually Abused: No    Family History  Problem Relation Age of Onset   Cancer Mother        stomach   Diabetes Mother    Cancer Father        brain   Heart disease Father    Cancer Brother        mouth   Heart disease Brother    Ovarian cancer  Maternal Grandmother      Vitals:   06/07/24 2352 06/08/24 0446 06/08/24 0512 06/08/24 0727  BP: 120/69 (!) 135/51  136/70  Pulse: 66 65  70  Resp: 18 20    Temp: 98 F (36.7 C) 98.4 F (36.9 C)  98 F (36.7 C)  TempSrc:      SpO2: 94% 95%  93%  Weight:   90 kg   Height:        PHYSICAL EXAM General: Well appearing elderly female, well nourished, in no acute distress. HEENT: Normocephalic and atraumatic. Neck: No JVD.   Lungs: Normal respiratory effort on room air. CTAB Heart: HRRR. Normal S1 and S2 without gallops or murmurs.  Abdomen: Non-distended appearing.  Msk: Normal strength and tone for age. Extremities: Warm and well perfused. No clubbing, cyanosis, edema.  Neuro: Alert and oriented X 3. Psych: Answers questions appropriately.   Labs: Basic Metabolic Panel: Recent Labs    06/07/24 0344 06/08/24 0401  NA 142 141  K 3.9 3.6  CL 105 103  CO2 29 30  GLUCOSE 125* 138*  BUN 21 21  CREATININE 0.68 0.75  CALCIUM  8.7* 8.6*   Liver Function Tests: Recent Labs    06/06/24 1421  AST 30  ALT 16  ALKPHOS 65  BILITOT 0.6  PROT 5.8*  ALBUMIN 3.1*   No results for input(s): LIPASE, AMYLASE in the last 72 hours. CBC: Recent Labs    06/07/24 0344 06/08/24 0401  WBC 4.7 4.4  HGB 7.8* 8.1*  HCT 25.6* 26.6*  MCV 82.3 82.1  PLT 154 170   Cardiac Enzymes: Recent Labs    06/06/24 1421 06/06/24 1613  TROPONINIHS 16 18*   BNP: Recent Labs    06/06/24 1421  BNP 99.9   D-Dimer: No results for input(s): DDIMER in the last 72 hours. Hemoglobin A1C: Recent Labs    06/07/24 0344  HGBA1C 7.2*   Fasting Lipid Panel: No results for input(s): CHOL, HDL,  LDLCALC, TRIG, CHOLHDL, LDLDIRECT in the last 72 hours. Thyroid  Function Tests: No results for input(s): TSH, T4TOTAL, T3FREE, THYROIDAB in the last 72 hours.  Invalid input(s): FREET3 Anemia Panel: Recent Labs    06/06/24 1421 06/07/24 1104  VITAMINB12  --  552   FOLATE  --  36.0  TIBC 468*  --   IRON 26*  --      Radiology: ECHOCARDIOGRAM COMPLETE Result Date: 06/07/2024    ECHOCARDIOGRAM REPORT   Patient Name:   GINETTE BRADWAY Date of Exam: 06/07/2024 Medical Rec #:  [id number]       Height:       66.0 in Accession #:    [id number]      Weight:       207.9 lb Date of Birth:  Jul 18, 1951        BSA:          2.033 m Patient Age:    73 years        BP:           132/51 mmHg Patient Gender: F               HR:           74 bpm. Exam Location:  [facility] Procedure: 2D Echo, Cardiac Doppler, Color Doppler, 3D Echo and Strain Analysis            (Both Spectral and Color Flow Doppler were utilized during            procedure). Indications:     CHF---acute diastolic I50.31  History:         Patient has no prior history of Echocardiogram examinations.                  Risk Factors:Hypertension, Diabetes and Sleep Apnea.  Sonographer:     Christopher Furnace Referring Phys:  [id number] UNRYLXTL AGBATA Diagnosing Phys: Keller Paterson  Sonographer Comments: Global longitudinal strain was attempted. IMPRESSIONS  1. Left ventricular ejection fraction, by estimation, is 50 to 55%. The left ventricle has low normal function. There is mild left ventricular hypertrophy. Left ventricular diastolic parameters are indeterminate.  2. Right ventricular systolic function is normal. The right ventricular size is normal. There is mildly elevated pulmonary artery systolic pressure. The estimated right ventricular systolic pressure is 45.0 mmHg.  3. Left atrial size was mildly dilated.  4. The mitral valve is normal in structure. Mild mitral valve regurgitation. Mild mitral stenosis. Moderate mitral annular calcification.  5. The aortic valve is tricuspid. Aortic valve regurgitation is mild. Mild aortic valve stenosis.  6. The inferior vena cava is normal in size with greater than 50% respiratory variability, suggesting right atrial pressure of 3 mmHg. FINDINGS  Left Ventricle: Left ventricular ejection fraction,  by estimation, is 50 to 55%. The left ventricle has low normal function. The left ventricular internal cavity size was normal in size. There is mild left ventricular hypertrophy. Left ventricular diastolic parameters are indeterminate.  LV Wall Scoring: The apical septal segment and apical inferior segment are hypokinetic. The entire anterior wall, entire lateral wall, anterior septum, inferior wall, mid inferoseptal segment, basal inferoseptal segment, and apex are normal. Right Ventricle: The right ventricular size is normal. No increase in right ventricular wall thickness. Right ventricular systolic function is normal. There is mildly elevated pulmonary artery systolic pressure. The tricuspid regurgitant velocity is 3.24  m/s, and with an assumed right atrial pressure of 3 mmHg, the estimated right ventricular systolic pressure is  45.0 mmHg. Left Atrium: Left atrial size was mildly dilated. Right Atrium: Right atrial size was normal in size. Pericardium: There is no evidence of pericardial effusion. Mitral Valve: The mitral valve is normal in structure. There is mild thickening of the mitral valve leaflet(s). Moderate mitral annular calcification. Mild mitral valve regurgitation. Mild mitral valve stenosis. MV peak gradient, 11.7 mmHg. The mean mitral valve gradient is 5.0 mmHg. Tricuspid Valve: The tricuspid valve is normal in structure. Tricuspid valve regurgitation is mild. Aortic Valve: The aortic valve is tricuspid. Aortic valve regurgitation is mild. Aortic regurgitation PHT measures 632 msec. Mild aortic stenosis is present. Aortic valve mean gradient measures 18.8 mmHg. Aortic valve peak gradient measures 28.9 mmHg. Aortic valve area, by VTI measures 1.20 cm. Pulmonic Valve: The pulmonic valve was not well visualized. Pulmonic valve regurgitation is not visualized. Aorta: The aortic root and ascending aorta are structurally normal, with no evidence of dilitation. Venous: The inferior vena cava is normal  in size with greater than 50% respiratory variability, suggesting right atrial pressure of 3 mmHg. IAS/Shunts: The atrial septum is grossly normal.  LEFT VENTRICLE PLAX 2D LVIDd:         5.70 cm   Diastology LVIDs:         3.60 cm   LV e' medial:    5.55 cm/s LV PW:         1.20 cm   LV E/e' medial:  26.8 LV IVS:        1.30 cm   LV e' lateral:   8.49 cm/s LVOT diam:     2.00 cm   LV E/e' lateral: 17.6 LV SV:         78 LV SV Index:   38 LVOT Area:     3.14 cm  RIGHT VENTRICLE RV Basal diam:  3.20 cm RV Mid diam:    2.90 cm RV S prime:     16.30 cm/s TAPSE (M-mode): 3.2 cm LEFT ATRIUM             Index        RIGHT ATRIUM           Index LA diam:        3.60 cm 1.77 cm/m   RA Area:     15.20 cm LA Vol (A2C):   67.2 ml 33.05 ml/m  RA Volume:   34.80 ml  17.12 ml/m LA Vol (A4C):   63.6 ml 31.28 ml/m LA Biplane Vol: 67.5 ml 33.20 ml/m  AORTIC VALVE AV Area (Vmax):    1.01 cm AV Area (Vmean):   1.07 cm AV Area (VTI):     1.20 cm AV Vmax:           269.00 cm/s AV Vmean:          204.250 cm/s AV VTI:            0.648 m AV Peak Grad:      28.9 mmHg AV Mean Grad:      18.8 mmHg LVOT Vmax:         86.20 cm/s LVOT Vmean:        69.300 cm/s LVOT VTI:          0.247 m LVOT/AV VTI ratio: 0.38 AI PHT:            632 msec  AORTA Ao Root diam: 2.80 cm MITRAL VALVE                TRICUSPID VALVE  MV Area (PHT): 3.65 cm     TR Peak grad:   42.0 mmHg MV Area VTI:   1.44 cm     TR Vmax:        324.00 cm/s MV Peak grad:  11.7 mmHg MV Mean grad:  5.0 mmHg     SHUNTS MV Vmax:       1.71 m/s     Systemic VTI:  0.25 m MV Vmean:      108.0 cm/s   Systemic Diam: 2.00 cm MV Decel Time: 208 msec MV E velocity: 149.00 cm/s MV A velocity: 152.00 cm/s MV E/A ratio:  0.98 Keller Paterson Electronically signed by Keller Paterson Signature Date/Time: 06/07/2024/12:59:30 PM    Final    DG Chest 2 View Result Date: 06/06/2024 CLINICAL DATA:  Chest pain and shortness of breath. EXAM: CHEST - 2 VIEW COMPARISON:  Chest radiograph dated  04/19/2023. FINDINGS: Mild cardiomegaly with mild vascular congestion. No focal consolidation, pleural effusion or pneumothorax. No acute osseous pathology. IMPRESSION: Mild cardiomegaly with mild vascular congestion. Electronically Signed   By: Vanetta Chou M.D.   On: 06/06/2024 15:00    ECHO pending  TELEMETRY reviewed by me 06/08/2024: sinus rhythm, rate 70s  EKG reviewed by me: sinus arrhthymia, rate 84 bpm with non specific ST-T wave changes  Data reviewed by me 06/08/2024: last 24h vitals tele labs imaging I/O hospitalist progress notes.  Principal Problem:   Acute CHF (congestive heart failure) (HCC) Active Problems:   ANXIETY DEPRESSION   Essential hypertension   Iron deficiency anemia   Diabetes mellitus without complication (HCC)    ASSESSMENT AND PLAN:  Laura [name] is a 73 y.o. 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 Patient has no known history of heart failure. Cardiology was consulted for further evaluation.   # New Congestive Heart Failure with pEF Patient presents with lower extremity edema and intermittent SOB, orthopnea. CXR with mild cardiomegaly and pulmonary vascular congestion. BNP at upper limit of normal at 100. Echo this admission with pEF (50-55%), low normal LV function, mild LVH, mildly elevated PASP, mild MR, mild MS with moderate mitral annular calcification, apical septal segment, apical inferior segment are hypokinetic.  -Monitor and replenish electrolytes for a goal K >4, Mag >2  -Continue IV lasix 40 mg twice daily for today. Start PO lasix 40 mg daily tomorrow.  -Continue spironolactone 25 mg daily.  -Continue home losartan  50 mg daily. -Continue metoprolol  tartrate 25 mg twice daily.   -Ordered empagliflozin 10 mg daily. ([employer] $47)  # Coronary artery disease (per CCTA 04/2023) # Hypertension # Hyperlipidemia Patient without worsening chest discomfort. Stress test on 01/2024 negative for ischemia and preserved EF. Trops minimally elevated and flat 16 > 18. EKG in ED with sinus arrhthymia without acute ischemic changes.  -losartan , metoprolol  as stated above.  -Continue rosuvastatin  10 mg daily.  -Minimally elevated and flat trops is most consistent with demand/supply mismatch and not ACS   # Nonalcoholic fatty liver disease  # Liver cirrhosis Abdomen US  from this admission with evidence of cirrhotic morphology of the liver, these finding also on US  on 07/2022. -Management per primary team.   Follow-up scheduled with Caralyn Hudson on 07/09 at [facility] for HF and Dr. Paterson on 07/23 at 2:45 PM.  This patient's plan of care was discussed and created with Dr. Federico and he is in agreement.  Signed: Dorene Comfort, PA-C  06/08/2024, 11:03 AM [facility]

## 2024-06-08 NOTE — Progress Notes (Addendum)
 Heart Failure Stewardship Pharmacy Note  PCP: Valora Agent, MD PCP-Cardiologist: None  HPI: Laura [name] is a 73 y.o. 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 On admission, BNP was 99.9, HS-troponin was 16, and TSAT was 6 (no ferritin). Chest x-ray noted mild vascular congestion.   Of note, history is positive for spinal stenosis, carpal tunnel, neuropathy, and aortic stenosis. These are all consistent with amyloid and may warrant further evaluation.   Pertinent cardiac history: Echo in 06/2022 with LVEF >55% and mild LVH. Stress test in 20/2025 was negative for ischemia with LVEF 54%. Echo this admission with LVEF 50-55%, mild LVH, mild MR, mild MS, mild AR, mild AS.  Pertinent Lab Values: Creatinine, Ser  Date Value Ref Range Status  06/08/2024 0.75 0.44 - 1.00 mg/dL Final   BUN  Date Value Ref Range Status  06/08/2024 21 8 - 23 mg/dL Final   Potassium  Date Value Ref Range Status  06/08/2024 3.6 3.5 - 5.1 mmol/L Final   Sodium  Date Value Ref Range Status  06/08/2024 141 135 - 145 mmol/L Final   B Natriuretic Peptide  Date Value Ref Range Status  06/06/2024 99.9 0.0 - 100.0 pg/mL Final    Comment:    Performed at [facility], [address] [place], [place] [address]   Magnesium   Date Value Ref Range Status  04/29/2021 1.9 1.7 - 2.4 mg/dL Final    Comment:    Performed at [facility], [address] [place], [place] [address]   Hgb A1c MFr Bld  Date Value Ref Range Status  06/07/2024 7.2 (H) 4.8 - 5.6 % Final    Comment:    (NOTE) Diagnosis of Diabetes The following HbA1c ranges recommended by the American Diabetes Association (ADA) may be used as an aid in the diagnosis of diabetes  mellitus.  Hemoglobin             Suggested A1C NGSP%              Diagnosis  <5.7                   Non Diabetic  5.7-6.4                Pre-Diabetic  >6.4                   Diabetic  <7.0                   Glycemic control for                       adults with diabetes.     TSH  Date Value Ref Range Status  04/19/2023 2.620 0.350 - 4.500 uIU/mL Final    Comment:    Performed by a 3rd Generation assay with a functional sensitivity of <=0.01 uIU/mL. Performed at [facility], [address] [place], [place] [address]    LDH  Date Value Ref Range Status  04/27/2021 134 98 - 192 U/L Final    Comment:    Performed at [facility], [address] [place], [place] [address]    Vital Signs:  Temp:  [97.5 F (36.4 C)-98.4 F (36.9 C)] 98 F (36.7 C) (07/02 0727) Pulse Rate:  [65-75] 70 (  07/02 0727) Cardiac Rhythm: Normal sinus rhythm (07/01 1900) Resp:  [18-20] 20 (07/02 0446) BP: (120-136)/(30-70) 136/70 (07/02 0727) SpO2:  [93 %-97 %] 93 % (07/02 0727) Weight:  [90 kg (198 lb 6.6 oz)] 90 kg (198 lb 6.6 oz) (07/02 0512)  Intake/Output Summary (Last 24 hours) at 06/08/2024 1059 Last data filed at 06/07/2024 1424 Gross per 24 hour  Intake 0 ml  Output --  Net 0 ml    Current Heart Failure Medications:  Loop diuretic: furosemide 40 mg IV BID Beta-Blocker: metoprolol  tartrate 25 mg BID ACEI/ARB/ARNI: losartan  50 mg daily MRA: spironolactone 25 mg daily SGLT2i: none Other: none  Prior to admission Heart Failure Medications:  Loop diuretic: none Beta-Blocker: none ACEI/ARB/ARNI: losartan  50 mg daily MRA: none SGLT2i: none Other: hydrochlorothiazide   Assessment: 1. Acute on chronic diastolic heart failure (LVEF 50-55%)  , due to unknown etiology. Given history of spinal stenosis, carpal tunnel, neuropathy, and aortic stenosis, may benefit from evaluation for TTR amyloid out of abundance of caution. NYHA class II-III symptoms.  -Symptoms:  Reports shortness of breath is improved. Mild LEE. Appetite is fair. -Volume: Appears mildly hypervolemic on exam. Mild LEE. Agree with furosemide 40 mg IV BID. -Hemodynamics: BP elevated, may require titration of losartan . -SGLT2i: Consider adding Jardiance 10 mg daily. A1c 7.2 -MRA: Continue spironolactone 25 mg daily -ARNI: Can consider ARNI if hypertensive on max losartan . -Patient received Venofer this admission for low iron indices. This formulation has low to no impact on CMRI compared to other formulations.  Plan: 1) Medication changes recommended at this time: -Consider adding Jardiance 10 mg daily  2) Patient assistance: -Copay for Bernadine is $47. If started today, please reach out so that a grant can be applied for. -[employer] grant pre-approved: Card No. [id number] BIN [id number] PCN [id number] PC Group [id number]  3) Education: - Patient has been educated on current HF medications and potential additions to HF medication regimen - Patient verbalizes understanding that over the next few months, these medication doses may change and more medications may be added to optimize HF regimen - Patient has been educated on basic disease state pathophysiology and goals of therapy  Medication Assistance / Insurance Benefits Check: Does the patient have prescription insurance?    Type of insurance plan:  Does the patient qualify for medication assistance through manufacturers or grants? Yes   Outpatient Pharmacy: Prior to admission outpatient pharmacy: [facility]      Please do not hesitate to reach out with questions or concerns,  Jaun Bash, PharmD, CPP, BCPS Heart Failure Pharmacist  Phone - [phone] 06/08/2024 10:59 AM

## 2024-06-08 NOTE — Care Management Important Message (Signed)
 Important Message  Patient Details  Name: Laura [name] MRN: [id number] Date of Birth: May 20, 1951   Important Message Given:  Yes - Medicare IM     Rojelio SHAUNNA Rattler 06/08/2024, 1:14 PM

## 2024-06-09 NOTE — Discharge Summary (Signed)
 Physician Discharge Summary   Patient: Laura [name] MRN: [id number] DOB: Feb 28, 1951  Admit date:     06/06/2024  Discharge date: 06/08/2024  Discharge Physician: Cresencio Fairly   PCP: Null Agent, MD   Recommendations at discharge:   Follow-up with outpatient providers as requested  Discharge Diagnoses: Principal Problem:   Acute CHF (congestive heart failure) (HCC) Active Problems:   Iron deficiency anemia   ANXIETY DEPRESSION   Essential hypertension   Diabetes mellitus without complication [facility])   Hospital Course: Assessment and Plan:  73 y.o. 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 Patient notes that she has had lower extremity swelling over the last 1 week associated with shortness Of breath mostly with exertion.  She has 2 pillow orthopnea and chest discomfort. She denies having any nausea, no vomiting, no abdominal pain, no cough, no fever, no chills, no blurred vision, no urinary symptoms or focal deficit. Abnormal labs include hemoglobin of 8.1 down from 11.3 from a year ago, BNP 99 Chest x-ray reviewed by me shows Mild cardiomegaly with mild vascular congestion.  Twelve-lead EKG reviewed by me shows sinus rhythm with nonspecific ST and T wave changes. Patient received a dose of Lasix 60 mg IV in the ER and will be admitted to the hospital for further evaluation  New congestive heart failure with preserved ejection fraction Presented with lower extremity edema and intermittent SOB, orthopnea. CXR with mild cardiomegaly and pulmonary vascular congestion. BNP at upper limit of normal at 100. Echo this admission with pEF (50-55%), low normal LV function, mild LVH, mildly elevated PASP, mild MR, mild MS with moderate mitral annular  calcification, apical septal segment, apical inferior segment are hypokinetic.  - Diuresed with IV Lasix and discharged on oral Lasix -Continue spironolactone 25 mg daily.  -Continue home losartan  50 mg daily. -Continue metoprolol  tartrate 25 mg twice daily.   Iron deficiency anemia Patient noted to have a drop in her hemoglobin from 11.3-8.1 IV iron Venofer 300 mg x 1 while in the hospital Will benefit from GI follow-up on discharge.  Patient would like to make her own appointment Per daughter, patient has already been recommended EGD and c scope with Dr. Aundria, but she has not been able to schedule yet   ANXIETY DEPRESSION Continue venlafaxine   Essential hypertension Diabetes mellitus without complication (HCC)    Hepatic cirrhosis Continue home lactulose  Titrate to 3-4 soft BM daily       Consultants: Cardiology Disposition: Home Diet recommendation:  Discharge Diet Orders (From admission, onward)     Start     Ordered   06/08/24 0000  Diet - low sodium heart healthy        06/08/24 1115           Carb modified diet DISCHARGE MEDICATION: Allergies as of 06/08/2024       Reactions   Atenolol Palpitations   Clarithromycin Other (See Comments), Nausea And Vomiting   Makes the tongue raw REACTION: nausea   Doxycycline Palpitations   Makes her heart beat fast Other reaction(s): GI Upset (intolerance) Loose bowels REACTION: loose stool   Codeine Itching   Erythromycin Base Nausea And Vomiting   Prednisone Other (See Comments)   Mood swings   Penicillins Rash   Has patient had a PCN reaction causing immediate rash, facial/tongue/throat swelling, SOB  or lightheadedness with hypotension: Yes Has patient had a PCN reaction causing severe rash involving mucus membranes or skin necrosis: No Has patient had a PCN reaction that required hospitalization No Has patient had a PCN reaction occurring within the last 10 years: No If all of the above answers are NO,  then may proceed with Cephalosporin use. REACTION: rash        Medication List     STOP taking these medications    benzonatate  200 MG capsule Commonly known as: TESSALON    clonazePAM  1 MG tablet Commonly known as: KLONOPIN    glipiZIDE  5 MG 24 hr tablet Commonly known as: GLUCOTROL  XL Replaced by: glipiZIDE  10 MG tablet   hydrochlorothiazide  25 MG tablet Commonly known as: HYDRODIURIL    venlafaxine XR 150 MG 24 hr capsule Commonly known as: EFFEXOR-XR       TAKE these medications    FeroSul 325 (65 Fe) MG tablet Generic drug: ferrous sulfate  Take 1 tablet (325 mg total) by mouth daily with breakfast.   furosemide 40 MG tablet Commonly known as: Lasix Take 1 tablet (40 mg total) by mouth daily.   gabapentin  300 MG capsule Commonly known as: NEURONTIN  Take 2 capsules (600 mg total) by mouth 4 (four) times daily. What changed:  how much to take how to take this when to take this additional instructions   glipiZIDE  10 MG tablet Commonly known as: GLUCOTROL  Take 1 tablet (10 mg total) by mouth daily before breakfast. Replaces: glipiZIDE  5 MG 24 hr tablet   Jardiance 10 MG Tabs tablet Generic drug: empagliflozin Take 1 tablet (10 mg total) by mouth daily.   lactulose  10 GM/15ML solution Commonly known as: CHRONULAC  Take 30 mLs (20 g total) by mouth 2 (two) times daily.   losartan  50 MG tablet Commonly known as: COZAAR  Take 50 mg by mouth daily.   metFORMIN  500 MG tablet Commonly known as: GLUCOPHAGE  Take 500 mg by mouth 2 (two) times daily.   metoprolol  tartrate 25 MG tablet Commonly known as: LOPRESSOR  Take 1 tablet (25 mg total) by mouth 2 (two) times daily.   NovoLOG  FlexPen 100 UNIT/ML FlexPen Generic drug: insulin  aspart Inject 0-60 Units into the skin 3 (three) times daily with meals. 60 units every morning and Sliding Scale for lunch and supper (0-24 units)   pantoprazole  40 MG tablet Commonly known as: PROTONIX  Take 1 tablet (40 mg  total) by mouth daily.   rosuvastatin  10 MG tablet Commonly known as: CRESTOR  Take 10 mg by mouth daily.   sertraline 100 MG tablet Commonly known as: ZOLOFT Take 100 mg by mouth every morning.   spironolactone 25 MG tablet Commonly known as: ALDACTONE Take 1 tablet (25 mg total) by mouth daily.        Follow-up Information     [place], Caralyn, PA-C. Go in 1 week(s).   Specialty: Cardiology Why: Appointment scheduled with [facility] on 7/9 at 2 PM Contact information: [address] [place] [address] [phone]         Wilburn Keller BROCKS, MD. Go in 3 week(s).   Specialty: Cardiology Why: Appointment scheduled with Dr. Wilburn at [facility] on 7/23 at 2:15 PM Contact information: [address] [place] [address] [phone]         Valora Agent, MD. Schedule an appointment as soon as possible for a visit in 1 week(s).   Specialty: Family Medicine Why: [facility] Discharge F/UP Contact information: [address] [place]  [facility] [place] [address] [phone]                Discharge Exam: Filed Weights   06/06/24 1418 06/07/24 0505 06/08/24 0512  Weight: 94.3 kg 94.3 kg 90 kg   General exam: Appears calm and comfortable  Respiratory system: Crackles.  Normal work of breathing.  Room air Cardiovascular system: S1-S2, RRR, no murmurs, trace pedal edema BLE Gastrointestinal system:, NT/ND, normal bowel sounds Central nervous system: Alert and oriented. No focal neurological deficits. Extremities: Symmetric 5 x 5 power. Skin: No rashes, lesions or ulcers Psychiatry: Judgement and insight appear normal. Mood & affect appropriate.   Condition at discharge: good  The results of significant diagnostics from this hospitalization (including imaging, microbiology, ancillary and laboratory) are listed below for reference.   Imaging Studies: ECHOCARDIOGRAM COMPLETE Result  Date: 06/07/2024    ECHOCARDIOGRAM REPORT   Patient Name:   Laura [name] Date of Exam: 06/07/2024 Medical Rec #:  [id number]       Height:       66.0 in Accession #:    [id number]      Weight:       207.9 lb Date of Birth:  1951/01/14        BSA:          2.033 m Patient Age:    73 years        BP:           132/51 mmHg Patient Gender: F               HR:           74 bpm. Exam Location:  [facility] Procedure: 2D Echo, Cardiac Doppler, Color Doppler, 3D Echo and Strain Analysis            (Both Spectral and Color Flow Doppler were utilized during            procedure). Indications:     CHF---acute diastolic I50.31  History:         Patient has no prior history of Echocardiogram examinations.                  Risk Factors:Hypertension, Diabetes and Sleep Apnea.  Sonographer:     Christopher Furnace Referring Phys:  [id number] UNRYLXTL AGBATA Diagnosing Phys: Keller Paterson  Sonographer Comments: Global longitudinal strain was attempted. IMPRESSIONS  1. Left ventricular ejection fraction, by estimation, is 50 to 55%. The left ventricle has low normal function. There is mild left ventricular hypertrophy. Left ventricular diastolic parameters are indeterminate.  2. Right ventricular systolic function is normal. The right ventricular size is normal. There is mildly elevated pulmonary artery systolic pressure. The estimated right ventricular systolic pressure is 45.0 mmHg.  3. Left atrial size was mildly dilated.  4. The mitral valve is normal in structure. Mild mitral valve regurgitation. Mild mitral stenosis. Moderate mitral annular calcification.  5. The aortic valve is tricuspid. Aortic valve regurgitation is mild. Mild aortic valve stenosis.  6. The inferior vena cava is normal in size with greater than 50% respiratory variability, suggesting right atrial pressure of 3 mmHg. FINDINGS  Left Ventricle: Left ventricular ejection fraction, by estimation, is 50 to 55%. The left ventricle has low normal function. The left ventricular internal  cavity size was normal in size. There is mild left ventricular hypertrophy. Left ventricular diastolic parameters are indeterminate.  LV Wall Scoring: The apical septal segment and apical inferior segment are hypokinetic. The entire anterior wall,  entire lateral wall, anterior septum, inferior wall, mid inferoseptal segment, basal inferoseptal segment, and apex are normal. Right Ventricle: The right ventricular size is normal. No increase in right ventricular wall thickness. Right ventricular systolic function is normal. There is mildly elevated pulmonary artery systolic pressure. The tricuspid regurgitant velocity is 3.24  m/s, and with an assumed right atrial pressure of 3 mmHg, the estimated right ventricular systolic pressure is 45.0 mmHg. Left Atrium: Left atrial size was mildly dilated. Right Atrium: Right atrial size was normal in size. Pericardium: There is no evidence of pericardial effusion. Mitral Valve: The mitral valve is normal in structure. There is mild thickening of the mitral valve leaflet(s). Moderate mitral annular calcification. Mild mitral valve regurgitation. Mild mitral valve stenosis. MV peak gradient, 11.7 mmHg. The mean mitral valve gradient is 5.0 mmHg. Tricuspid Valve: The tricuspid valve is normal in structure. Tricuspid valve regurgitation is mild. Aortic Valve: The aortic valve is tricuspid. Aortic valve regurgitation is mild. Aortic regurgitation PHT measures 632 msec. Mild aortic stenosis is present. Aortic valve mean gradient measures 18.8 mmHg. Aortic valve peak gradient measures 28.9 mmHg. Aortic valve area, by VTI measures 1.20 cm. Pulmonic Valve: The pulmonic valve was not well visualized. Pulmonic valve regurgitation is not visualized. Aorta: The aortic root and ascending aorta are structurally normal, with no evidence of dilitation. Venous: The inferior vena cava is normal in size with greater than 50% respiratory variability, suggesting right atrial pressure of 3 mmHg.  IAS/Shunts: The atrial septum is grossly normal.  LEFT VENTRICLE PLAX 2D LVIDd:         5.70 cm   Diastology LVIDs:         3.60 cm   LV e' medial:    5.55 cm/s LV PW:         1.20 cm   LV E/e' medial:  26.8 LV IVS:        1.30 cm   LV e' lateral:   8.49 cm/s LVOT diam:     2.00 cm   LV E/e' lateral: 17.6 LV SV:         78 LV SV Index:   38 LVOT Area:     3.14 cm  RIGHT VENTRICLE RV Basal diam:  3.20 cm RV Mid diam:    2.90 cm RV S prime:     16.30 cm/s TAPSE (M-mode): 3.2 cm LEFT ATRIUM             Index        RIGHT ATRIUM           Index LA diam:        3.60 cm 1.77 cm/m   RA Area:     15.20 cm LA Vol (A2C):   67.2 ml 33.05 ml/m  RA Volume:   34.80 ml  17.12 ml/m LA Vol (A4C):   63.6 ml 31.28 ml/m LA Biplane Vol: 67.5 ml 33.20 ml/m  AORTIC VALVE AV Area (Vmax):    1.01 cm AV Area (Vmean):   1.07 cm AV Area (VTI):     1.20 cm AV Vmax:           269.00 cm/s AV Vmean:          204.250 cm/s AV VTI:            0.648 m AV Peak Grad:      28.9 mmHg AV Mean Grad:      18.8 mmHg LVOT Vmax:  86.20 cm/s LVOT Vmean:        69.300 cm/s LVOT VTI:          0.247 m LVOT/AV VTI ratio: 0.38 AI PHT:            632 msec  AORTA Ao Root diam: 2.80 cm MITRAL VALVE                TRICUSPID VALVE MV Area (PHT): 3.65 cm     TR Peak grad:   42.0 mmHg MV Area VTI:   1.44 cm     TR Vmax:        324.00 cm/s MV Peak grad:  11.7 mmHg MV Mean grad:  5.0 mmHg     SHUNTS MV Vmax:       1.71 m/s     Systemic VTI:  0.25 m MV Vmean:      108.0 cm/s   Systemic Diam: 2.00 cm MV Decel Time: 208 msec MV E velocity: 149.00 cm/s MV A velocity: 152.00 cm/s MV E/A ratio:  0.98 Keller Paterson Electronically signed by Keller Paterson Signature Date/Time: 06/07/2024/12:59:30 PM    Final    DG Chest 2 View Result Date: 06/06/2024 CLINICAL DATA:  Chest pain and shortness of breath. EXAM: CHEST - 2 VIEW COMPARISON:  Chest radiograph dated 04/19/2023. FINDINGS: Mild cardiomegaly with mild vascular congestion. No focal consolidation, pleural  effusion or pneumothorax. No acute osseous pathology. IMPRESSION: Mild cardiomegaly with mild vascular congestion. Electronically Signed   By: Vanetta Chou M.D.   On: 06/06/2024 15:00    Microbiology: Results for orders placed or performed during the hospital encounter of 08/30/21  Urine Culture     Status: Abnormal   Collection Time: 08/30/21 10:32 AM   Specimen: Urine, Random  Result Value Ref Range Status   Specimen Description   Final    URINE, RANDOM Performed at [facility], [address] [place], [place] [address]    Special Requests   Final    NONE Performed at [facility], [address] [place], [place] [address]    Culture >=100,000 COLONIES/mL KLEBSIELLA PNEUMONIAE (A)  Final   Report Status 09/02/2021 FINAL  Final   Organism ID, Bacteria KLEBSIELLA PNEUMONIAE (A)  Final      Susceptibility   Klebsiella pneumoniae - MIC*    AMPICILLIN >=32 RESISTANT Resistant     CEFAZOLIN <=4 SENSITIVE Sensitive     CEFEPIME <=0.12 SENSITIVE Sensitive     CEFTRIAXONE  <=0.25 SENSITIVE Sensitive     CIPROFLOXACIN <=0.25 SENSITIVE Sensitive     GENTAMICIN <=1 SENSITIVE Sensitive     IMIPENEM <=0.25 SENSITIVE Sensitive     NITROFURANTOIN 128 RESISTANT Resistant     TRIMETH/SULFA >=320 RESISTANT Resistant     AMPICILLIN/SULBACTAM 4 SENSITIVE Sensitive     PIP/TAZO <=4 SENSITIVE Sensitive     * >=100,000 COLONIES/mL KLEBSIELLA PNEUMONIAE    Labs: CBC: Recent Labs  Lab 06/06/24 1421 06/07/24 0344 06/08/24 0401  WBC 4.2 4.7 4.4  HGB 8.1* 7.8* 8.1*  HCT 26.8* 25.6* 26.6*  MCV 84.5 82.3 82.1  PLT 162 154 170   Basic Metabolic Panel: Recent Labs  Lab 06/06/24 1421 06/07/24 0344 06/08/24 0401  NA 143 142 141  K 4.0 3.9 3.6  CL 108 105 103  CO2 25 29 30   GLUCOSE 227* 125* 138*  BUN 21 21 21   CREATININE 0.74 0.68 0.75  CALCIUM  8.8* 8.7* 8.6*   Liver Function Tests: Recent Labs  Lab 06/06/24 1421  AST 30  ALT 16  ALKPHOS 65  BILITOT 0.6   PROT 5.8*  ALBUMIN 3.1*   CBG: Recent Labs  Lab 06/07/24 1149 06/07/24 1650 06/07/24 2119 06/08/24 0729 06/08/24 1204  GLUCAP 242* 151* 164* 156* 217*    Discharge time spent: greater than 30 minutes.  Signed: Cresencio Fairly, MD [facility] 06/09/2024

## 2024-06-28 ENCOUNTER — Other Ambulatory Visit (HOSPITAL_COMMUNITY): Payer: Self-pay

## 2024-07-08 ENCOUNTER — Inpatient Hospital Stay: Attending: Oncology | Admitting: Oncology

## 2024-07-08 ENCOUNTER — Ambulatory Visit: Payer: Self-pay | Admitting: Oncology

## 2024-07-08 ENCOUNTER — Inpatient Hospital Stay

## 2024-07-08 ENCOUNTER — Encounter: Payer: Self-pay | Admitting: Oncology

## 2024-07-08 VITALS — BP 127/51 | HR 53 | Temp 97.4°F | Resp 18 | Wt 191.6 lb

## 2024-07-08 DIAGNOSIS — K7460 Unspecified cirrhosis of liver: Secondary | ICD-10-CM | POA: Insufficient documentation

## 2024-07-08 DIAGNOSIS — Z139 Encounter for screening, unspecified: Secondary | ICD-10-CM

## 2024-07-08 DIAGNOSIS — Z8041 Family history of malignant neoplasm of ovary: Secondary | ICD-10-CM | POA: Insufficient documentation

## 2024-07-08 DIAGNOSIS — D500 Iron deficiency anemia secondary to blood loss (chronic): Secondary | ICD-10-CM

## 2024-07-08 DIAGNOSIS — D509 Iron deficiency anemia, unspecified: Secondary | ICD-10-CM | POA: Insufficient documentation

## 2024-07-08 DIAGNOSIS — K921 Melena: Secondary | ICD-10-CM | POA: Diagnosis not present

## 2024-07-08 DIAGNOSIS — Z808 Family history of malignant neoplasm of other organs or systems: Secondary | ICD-10-CM | POA: Diagnosis not present

## 2024-07-08 LAB — RETIC PANEL
Immature Retic Fract: 18.3 % — ABNORMAL HIGH (ref 2.3–15.9)
RBC.: 4.28 MIL/uL (ref 3.87–5.11)
Retic Count, Absolute: 92.4 K/uL (ref 19.0–186.0)
Retic Ct Pct: 2.2 % (ref 0.4–3.1)
Reticulocyte Hemoglobin: 28 pg (ref >27.9)

## 2024-07-08 LAB — CBC WITH DIFFERENTIAL/PLATELET
Abs Immature Granulocytes: 0.02 K/uL (ref 0.00–0.07)
Basophils Absolute: 0 K/uL (ref 0.0–0.1)
Basophils Relative: 1 %
Eosinophils Absolute: 0.1 K/uL (ref 0.0–0.5)
Eosinophils Relative: 2 %
HCT: 34.9 % — ABNORMAL LOW (ref 36.0–46.0)
Hemoglobin: 10.6 g/dL — ABNORMAL LOW (ref 12.0–15.0)
Immature Granulocytes: 0 %
Lymphocytes Relative: 15 %
Lymphs Abs: 0.8 K/uL (ref 0.7–4.0)
MCH: 24.8 pg — ABNORMAL LOW (ref 26.0–34.0)
MCHC: 30.4 g/dL (ref 30.0–36.0)
MCV: 81.7 fL (ref 80.0–100.0)
Monocytes Absolute: 0.6 K/uL (ref 0.1–1.0)
Monocytes Relative: 10 %
Neutro Abs: 3.8 K/uL (ref 1.7–7.7)
Neutrophils Relative %: 72 %
Platelets: 152 K/uL (ref 150–400)
RBC: 4.27 MIL/uL (ref 3.87–5.11)
RDW: 14.9 % (ref 11.5–15.5)
WBC: 5.3 K/uL (ref 4.0–10.5)
nRBC: 0 % (ref 0.0–0.2)

## 2024-07-08 LAB — FERRITIN: Ferritin: 13 ng/mL (ref 11–307)

## 2024-07-08 LAB — PROTIME-INR
INR: 1.2 (ref 0.8–1.2)
Prothrombin Time: 15.9 s — ABNORMAL HIGH (ref 11.4–15.2)

## 2024-07-08 LAB — IRON AND TIBC
Iron: 131 ug/dL (ref 28–170)
Saturation Ratios: 25 % (ref 10.4–31.8)
TIBC: 524 ug/dL — ABNORMAL HIGH (ref 250–450)
UIBC: 393 ug/dL

## 2024-07-08 LAB — APTT: aPTT: 37 s — ABNORMAL HIGH (ref 24–36)

## 2024-07-08 NOTE — Assessment & Plan Note (Signed)
 Labs are reviewed and discussed with patient. Lab Results  Component Value Date   HGB 10.6 (L) 07/08/2024   TIBC 524 (H) 07/08/2024   IRONPCTSAT 25 07/08/2024   FERRITIN 13 07/08/2024    Patient has tolerated iron  infusion during her recent hospitalization. [place] iron  level showed increased TIBC 524, borderline ferritin of 13, hemoglobin 10.6.  I recommend IV Venofer  weekly x 3 to further improve her iron  stores.

## 2024-07-08 NOTE — Addendum Note (Signed)
 Addended by: BABARA CALL on: 07/08/2024 02:42 PM   Modules accepted: Orders

## 2024-07-08 NOTE — Addendum Note (Signed)
 Addended by: BARI FONDA MATSU on: 07/08/2024 03:15 PM   Modules accepted: Orders

## 2024-07-08 NOTE — Assessment & Plan Note (Signed)
 Recommend GI work up

## 2024-07-08 NOTE — Assessment & Plan Note (Signed)
 Recommend patient to follow-up with gastroenterology for EGD and colonoscopy. Check AFP, PT PTT.

## 2024-07-08 NOTE — Progress Notes (Signed)
 Hematology/Oncology Consult note [phone] [phone]        REFERRING PROVIDER: Valora Agent, MD   CHIEF COMPLAINTS/REASON FOR VISIT:  Evaluation of iron  deficiency anemia.    ASSESSMENT & PLAN:   Iron  deficiency anemia Labs are reviewed and discussed with patient. Lab Results  Component Value Date   HGB 10.6 (L) 07/08/2024   TIBC 524 (H) 07/08/2024   IRONPCTSAT 25 07/08/2024   FERRITIN 13 07/08/2024    Patient has tolerated iron  infusion during her recent hospitalization. Vidaza iron  level showed increased TIBC 524, borderline ferritin of 13, hemoglobin 10.6.  I recommend IV Venofer  weekly x 3 to further improve her iron  stores.  Cirrhosis of liver without ascites (HCC) Recommend patient to follow-up with gastroenterology for EGD and colonoscopy. Check AFP, PT PTT.  Blood in the stool Recommend GI workup   Orders Placed This Encounter  Procedures   Iron  and TIBC    Standing Status:   Future    Number of Occurrences:   1    Expected Date:   07/08/2024    Expiration Date:   10/06/2024   Ferritin    Standing Status:   Future    Number of Occurrences:   1    Expected Date:   07/08/2024    Expiration Date:   10/06/2024   Retic Panel    Standing Status:   Future    Number of Occurrences:   1    Expected Date:   07/08/2024    Expiration Date:   10/06/2024   CBC with Differential/Platelet    Standing Status:   Future    Number of Occurrences:   1    Expected Date:   07/08/2024    Expiration Date:   10/06/2024   AFP tumor marker    Standing Status:   Future    Number of Occurrences:   1    Expected Date:   07/08/2024    Expiration Date:   10/06/2024   APTT    Standing Status:   Future    Number of Occurrences:   1    Expected Date:   07/08/2024    Expiration Date:   10/06/2024   Protime-INR    Standing Status:   Future    Number of Occurrences:   1    Expected Date:   07/08/2024    Expiration Date:   10/06/2024   Follow-up in 3  months. All questions were answered. The patient knows to call the clinic with any problems, questions or concerns.  Zelphia Cap, MD, PhD [facility] Hematology Oncology 07/08/2024   HISTORY OF PRESENTING ILLNESS:   Laura [name] is a  73 y.o.  female with PMH listed below was seen in consultation at the request of  Valora Agent, MD  for evaluation of iron  deficiency anemia.   Discussed the use of AI scribe software for clinical note transcription with the patient, who gave verbal consent to proceed.   Daughter was connected during the encounter per patient's request.  Patient  has a history of chronic anemia since at least 2021 and has been on oral iron  supplements for several years.  June 2025 she was hospitalized due to acute CHF.  06/06/2024 iron  panel showed saturation of 6, TIBC 468. 06/07/2024 ferritin 36 Hb 7.8. She received one dose of Venofer  300mg  x 1 and tolerated well without any side effects.   She reports occasional blood in her stool. She has a history of liver cirrhosis  and hepatic encephalopathy.  She was seen by GI Dr. Aundria in January 2025 and was recommend to get EGD/colonoscopy done.  She is overdue.  Due to transportation issue, patient has deferred her studies.  Daughter is aware about this and the plan to stop the patient to arrange studies done when she is able to provide transportation.   MEDICAL HISTORY:  Past Medical History:  Diagnosis Date   Allergy    Anxiety    Asthma    Chronic back pain    Chronic cough 07/01/10   PFT  FEV1 2.20 (93%), FEV 1% 81, TLC 4,12 (81%), DLCO 78%, no BD. 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   Depression    Diabetes mellitus type 2, uncomplicated (HCC)    Diabetes mellitus without complication (HCC)    Diabetic neuropathy (HCC)    Diverticulosis    Dysrhythmia    H/O PALPITATIONS-SINCE ABLATION IN 2013 AT [facility], PALPITATIONS MUCH BETTER   GERD (gastroesophageal reflux disease)    NO  MEDS   Hemorrhoids    HTN (hypertension)    NAFLD (nonalcoholic fatty liver disease) 97/88/7980   Neuropathy    Osteoarthritis    Sleep apnea    NO CPAP    SURGICAL HISTORY: Past Surgical History:  Procedure Laterality Date   BRONCHOSCOPY  07/25/10   CARDIAC ELECTROPHYSIOLOGY STUDY AND ABLATION     CARPAL TUNNEL RELEASE     CATARACT EXTRACTION Bilateral Jan 2017   cathater ablation     for arrhythmia   COLONOSCOPY  2014   COLONOSCOPY WITH PROPOFOL  N/A 09/07/2019   Procedure: COLONOSCOPY WITH PROPOFOL ;  Surgeon: Toledo, Ladell POUR, MD;  Location: [facility] ENDOSCOPY;  Service: Gastroenterology;  Laterality: N/A;   ESOPHAGOGASTRODUODENOSCOPY (EGD) WITH PROPOFOL  N/A 07/05/2018   Procedure: ESOPHAGOGASTRODUODENOSCOPY (EGD) WITH PROPOFOL ;  Surgeon: Viktoria Lamar DASEN, MD;  Location: [facility] ENDOSCOPY;  Service: Endoscopy;  Laterality: N/A;   FOOT SURGERY     HEMORROIDECTOMY     LIPOMA EXCISION Right 10/20/2016   Procedure: EXCISION LIPOMA;  Surgeon: Louanne KANDICE Muse, MD;  Location: [facility] ORS;  Service: General;  Laterality: Right;   PARTIAL HYSTERECTOMY     TOENAIL EXCISION Right     SOCIAL HISTORY: Social History   Socioeconomic History   Marital status: Widowed    Spouse name: Not on file   Number of children: Not on file   Years of education: Not on file   Highest education level: Not on file  Occupational History   Not on file  Tobacco Use   Smoking status: Never   Smokeless tobacco: Never  Vaping Use   Vaping status: Never Used  Substance and Sexual Activity   Alcohol use: No    Alcohol/week: 0.0 standard drinks of alcohol   Drug use: No   Sexual activity: Not on file  Other Topics Concern   Not on file  Social History Narrative   Married, 2 children. Works in day care center   Cell: [phone]    Social Drivers of Health   Financial Resource Strain: Medium Risk (04/25/2024)   Received from [facility]   Overall Financial Resource Strain  (CARDIA)    Difficulty of Paying Living Expenses: Somewhat hard  Food Insecurity: No Food Insecurity (06/06/2024)   Hunger Vital Sign    Worried About Running Out of Food in the Last Year: Never true    Ran Out of Food in the Last Year: Never true  Recent Concern:  Food Insecurity - Food Insecurity Present (04/25/2024)   Received from [facility]   Hunger Vital Sign    Within the past 12 months, you worried that your food would run out before you got the money to buy more.: Sometimes true    Within the past 12 months, the food you bought just didn't last and you didn't have money to get more.: Sometimes true  Transportation Needs: No Transportation Needs (06/06/2024)   PRAPARE - Administrator, Civil Service (Medical): No    Lack of Transportation (Non-Medical): No  Recent Concern: Transportation Needs - Unmet Transportation Needs (04/25/2024)   Received from [facility] - Transportation    In the past 12 months, has lack of transportation kept you from medical appointments or from getting medications?: Yes    Lack of Transportation (Non-Medical): No  Physical Activity: Not on file  Stress: Not on file  Social Connections: Socially Isolated (06/06/2024)   Social Connection and Isolation Panel    Frequency of Communication with Friends and Family: Never    Frequency of Social Gatherings with Friends and Family: Never    Attends Religious Services: 1 to 4 times per year    Active Member of [employer] or Organizations: No    Attends Banker Meetings: Never    Marital Status: Widowed  Intimate Partner Violence: Not At Risk (06/06/2024)   Humiliation, Afraid, Rape, and Kick questionnaire    Fear of Current or Ex-Partner: No    Emotionally Abused: No    Physically Abused: No    Sexually Abused: No    FAMILY HISTORY: Family History  Problem Relation Age of Onset   Cancer Mother        stomach   Diabetes Mother    Cancer  Father        brain   Heart disease Father    Cancer Brother        mouth   Heart disease Brother    Ovarian cancer Maternal Grandmother     ALLERGIES:  is allergic to atenolol, clarithromycin, doxycycline, codeine, erythromycin base, prednisone, and penicillins.  MEDICATIONS:  Current Outpatient Medications  Medication Sig Dispense Refill   empagliflozin  (JARDIANCE ) 10 MG TABS tablet Take 1 tablet (10 mg total) by mouth daily. 30 tablet 0   ferrous sulfate  325 (65 FE) MG tablet Take 1 tablet (325 mg total) by mouth daily with breakfast. 30 tablet 2   furosemide  (LASIX ) 40 MG tablet Take 1 tablet (40 mg total) by mouth daily. 30 tablet 2   gabapentin  (NEURONTIN ) 300 MG capsule Take 2 capsules (600 mg total) by mouth 4 (four) times daily. 240 capsule 0   glipiZIDE  (GLUCOTROL ) 10 MG tablet Take 1 tablet (10 mg total) by mouth daily before breakfast. 30 tablet 0   lactulose  (CHRONULAC ) 10 GM/15ML solution Take 30 mLs (20 g total) by mouth 2 (two) times daily. 1800 mL 0   losartan  (COZAAR ) 50 MG tablet Take 50 mg by mouth daily.     metFORMIN  (GLUCOPHAGE ) 500 MG tablet Take 500 mg by mouth 2 (two) times daily.     metoprolol  tartrate (LOPRESSOR ) 25 MG tablet Take 1 tablet (25 mg total) by mouth 2 (two) times daily. 60 tablet 0   NOVOLOG  FLEXPEN 100 UNIT/ML FlexPen Inject 0-60 Units into the skin 3 (three) times daily with meals. 60 units every morning and Sliding Scale for lunch and supper (0-24 units)  pantoprazole  (PROTONIX ) 40 MG tablet Take 1 tablet (40 mg total) by mouth daily. 30 tablet 2   rosuvastatin  (CRESTOR ) 10 MG tablet Take 10 mg by mouth daily.     sertraline  (ZOLOFT ) 100 MG tablet Take 100 mg by mouth every morning.     spironolactone  (ALDACTONE ) 25 MG tablet Take 1 tablet (25 mg total) by mouth daily. 30 tablet 0   No current facility-administered medications for this visit.    Review of Systems  Constitutional:  Positive for fatigue. Negative for appetite change,  chills and fever.  HENT:   Negative for hearing loss and voice change.   Eyes:  Negative for eye problems.  Respiratory:  Negative for chest tightness and cough.   Cardiovascular:  Negative for chest pain.  Gastrointestinal:  Positive for blood in stool. Negative for abdominal distention and abdominal pain.  Endocrine: Negative for hot flashes.  Genitourinary:  Negative for difficulty urinating and frequency.   Musculoskeletal:  Negative for arthralgias.  Skin:  Negative for itching and rash.  Neurological:  Negative for extremity weakness.  Hematological:  Negative for adenopathy.  Psychiatric/Behavioral:  Negative for confusion.    PHYSICAL EXAMINATION:  Vitals:   07/08/24 0932  BP: (!) 127/51  Pulse: (!) 53  Resp: 18  Temp: (!) 97.4 F (36.3 C)  SpO2: 98%   Filed Weights   07/08/24 0932  Weight: 191 lb 9.6 oz (86.9 kg)    Physical Exam Constitutional:      General: She is not in acute distress. HENT:     Head: Normocephalic and atraumatic.  Eyes:     General: No scleral icterus. Cardiovascular:     Rate and Rhythm: Normal rate and regular rhythm.     Heart sounds: Normal heart sounds.  Pulmonary:     Effort: Pulmonary effort is normal. No respiratory distress.     Breath sounds: No wheezing.  Abdominal:     General: Bowel sounds are normal. There is no distension.     Palpations: Abdomen is soft.  Musculoskeletal:        General: No deformity. Normal range of motion.     Cervical back: Normal range of motion and neck supple.  Skin:    General: Skin is warm and dry.     Findings: No erythema or rash.  Neurological:     Mental Status: She is alert and oriented to person, place, and time. Mental status is at baseline.  Psychiatric:        Mood and Affect: Mood normal.     LABORATORY DATA:  I have reviewed the data as listed    Latest Ref Rng & Units 07/08/2024   10:26 AM 06/08/2024    4:01 AM 06/07/2024    3:44 AM  CBC  WBC 4.0 - 10.5 K/uL 5.3  4.4  4.7    Hemoglobin 12.0 - 15.0 g/dL 89.3  8.1  7.8   Hematocrit 36.0 - 46.0 % 34.9  26.6  25.6   Platelets 150 - 400 K/uL 152  170  154       Latest Ref Rng & Units 06/08/2024    4:01 AM 06/07/2024    3:44 AM 06/06/2024    2:21 PM  CMP  Glucose 70 - 99 mg/dL 861  874  772   BUN 8 - 23 mg/dL 21  21  21    Creatinine 0.44 - 1.00 mg/dL 9.24  9.31  9.25   Sodium 135 - 145 mmol/L 141  142  143   Potassium 3.5 - 5.1 mmol/L 3.6  3.9  4.0   Chloride 98 - 111 mmol/L 103  105  108   CO2 22 - 32 mmol/L 30  29  25    Calcium  8.9 - 10.3 mg/dL 8.6  8.7  8.8   Total Protein 6.5 - 8.1 g/dL   5.8   Total Bilirubin 0.0 - 1.2 mg/dL   0.6   Alkaline Phos 38 - 126 U/L   65   AST 15 - 41 U/L   30   ALT 0 - 44 U/L   16       RADIOGRAPHIC STUDIES: I have personally reviewed the radiological images as listed and agreed with the findings in the report. ECHOCARDIOGRAM COMPLETE Result Date: 06/07/2024    ECHOCARDIOGRAM REPORT   Patient Name:   Laura [name] Date of Exam: 06/07/2024 Medical Rec #:  [id number]       Height:       66.0 in Accession #:    [id number]      Weight:       207.9 lb Date of Birth:  [birth year]        BSA:          2.033 m Patient Age:    73 years        BP:           132/51 mmHg Patient Gender: F               HR:           74 bpm. Exam Location:  [facility] Procedure: 2D Echo, Cardiac Doppler, Color Doppler, 3D Echo and Strain Analysis            (Both Spectral and Color Flow Doppler were utilized during            procedure). Indications:     CHF---acute diastolic I50.31  History:         Patient has no prior history of Echocardiogram examinations.                  Risk Factors:Hypertension, Diabetes and Sleep Apnea.  Sonographer:     Christopher Furnace Referring Phys:  [id number] UNRYLXTL AGBATA Diagnosing Phys: Keller Paterson  Sonographer Comments: Global longitudinal strain was attempted. IMPRESSIONS  1. Left ventricular ejection fraction, by estimation, is 50 to 55%. The left ventricle has low normal function. There is  mild left ventricular hypertrophy. Left ventricular diastolic parameters are indeterminate.  2. Right ventricular systolic function is normal. The right ventricular size is normal. There is mildly elevated pulmonary artery systolic pressure. The estimated right ventricular systolic pressure is 45.0 mmHg.  3. Left atrial size was mildly dilated.  4. The mitral valve is normal in structure. Mild mitral valve regurgitation. Mild mitral stenosis. Moderate mitral annular calcification.  5. The aortic valve is tricuspid. Aortic valve regurgitation is mild. Mild aortic valve stenosis.  6. The inferior vena cava is normal in size with greater than 50% respiratory variability, suggesting right atrial pressure of 3 mmHg. FINDINGS  Left Ventricle: Left ventricular ejection fraction, by estimation, is 50 to 55%. The left ventricle has low normal function. The left ventricular internal cavity size was normal in size. There is mild left ventricular hypertrophy. Left ventricular diastolic parameters are indeterminate.  LV Wall Scoring: The apical septal segment and apical inferior segment are hypokinetic. The entire anterior wall, entire lateral wall, anterior septum, inferior wall, mid inferoseptal segment, basal inferoseptal segment, and  apex are normal. Right Ventricle: The right ventricular size is normal. No increase in right ventricular wall thickness. Right ventricular systolic function is normal. There is mildly elevated pulmonary artery systolic pressure. The tricuspid regurgitant velocity is 3.24  m/s, and with an assumed right atrial pressure of 3 mmHg, the estimated right ventricular systolic pressure is 45.0 mmHg. Left Atrium: Left atrial size was mildly dilated. Right Atrium: Right atrial size was normal in size. Pericardium: There is no evidence of pericardial effusion. Mitral Valve: The mitral valve is normal in structure. There is mild thickening of the mitral valve leaflet(s). Moderate mitral annular  calcification. Mild mitral valve regurgitation. Mild mitral valve stenosis. MV peak gradient, 11.7 mmHg. The mean mitral valve gradient is 5.0 mmHg. Tricuspid Valve: The tricuspid valve is normal in structure. Tricuspid valve regurgitation is mild. Aortic Valve: The aortic valve is tricuspid. Aortic valve regurgitation is mild. Aortic regurgitation PHT measures 632 msec. Mild aortic stenosis is present. Aortic valve mean gradient measures 18.8 mmHg. Aortic valve peak gradient measures 28.9 mmHg. Aortic valve area, by VTI measures 1.20 cm. Pulmonic Valve: The pulmonic valve was not well visualized. Pulmonic valve regurgitation is not visualized. Aorta: The aortic root and ascending aorta are structurally normal, with no evidence of dilitation. Venous: The inferior vena cava is normal in size with greater than 50% respiratory variability, suggesting right atrial pressure of 3 mmHg. IAS/Shunts: The atrial septum is grossly normal.  LEFT VENTRICLE PLAX 2D LVIDd:         5.70 cm   Diastology LVIDs:         3.60 cm   LV e' medial:    5.55 cm/s LV PW:         1.20 cm   LV E/e' medial:  26.8 LV IVS:        1.30 cm   LV e' lateral:   8.49 cm/s LVOT diam:     2.00 cm   LV E/e' lateral: 17.6 LV SV:         78 LV SV Index:   38 LVOT Area:     3.14 cm  RIGHT VENTRICLE RV Basal diam:  3.20 cm RV Mid diam:    2.90 cm RV S prime:     16.30 cm/s TAPSE (M-mode): 3.2 cm LEFT ATRIUM             Index        RIGHT ATRIUM           Index LA diam:        3.60 cm 1.77 cm/m   RA Area:     15.20 cm LA Vol (A2C):   67.2 ml 33.05 ml/m  RA Volume:   34.80 ml  17.12 ml/m LA Vol (A4C):   63.6 ml 31.28 ml/m LA Biplane Vol: 67.5 ml 33.20 ml/m  AORTIC VALVE AV Area (Vmax):    1.01 cm AV Area (Vmean):   1.07 cm AV Area (VTI):     1.20 cm AV Vmax:           269.00 cm/s AV Vmean:          204.250 cm/s AV VTI:            0.648 m AV Peak Grad:      28.9 mmHg AV Mean Grad:      18.8 mmHg LVOT Vmax:         86.20 cm/s LVOT Vmean:        69.300  cm/s  LVOT VTI:          0.247 m LVOT/AV VTI ratio: 0.38 AI PHT:            632 msec  AORTA Ao Root diam: 2.80 cm MITRAL VALVE                TRICUSPID VALVE MV Area (PHT): 3.65 cm     TR Peak grad:   42.0 mmHg MV Area VTI:   1.44 cm     TR Vmax:        324.00 cm/s MV Peak grad:  11.7 mmHg MV Mean grad:  5.0 mmHg     SHUNTS MV Vmax:       1.71 m/s     Systemic VTI:  0.25 m MV Vmean:      108.0 cm/s   Systemic Diam: 2.00 cm MV Decel Time: 208 msec MV E velocity: 149.00 cm/s MV A velocity: 152.00 cm/s MV E/A ratio:  0.98 Keller Paterson Electronically signed by Keller Paterson Signature Date/Time: 06/07/2024/12:59:30 PM    Final    DG Chest 2 View Result Date: 06/06/2024 CLINICAL DATA:  Chest pain and shortness of breath. EXAM: CHEST - 2 VIEW COMPARISON:  Chest radiograph dated 04/19/2023. FINDINGS: Mild cardiomegaly with mild vascular congestion. No focal consolidation, pleural effusion or pneumothorax. No acute osseous pathology. IMPRESSION: Mild cardiomegaly with mild vascular congestion. Electronically Signed   By: Vanetta Chou M.D.   On: 06/06/2024 15:00

## 2024-07-09 LAB — AFP TUMOR MARKER: AFP, Serum, Tumor Marker: 4.5 ng/mL (ref 0.0–9.2)

## 2024-07-11 ENCOUNTER — Inpatient Hospital Stay

## 2024-07-11 NOTE — Progress Notes (Signed)
 [facility] Clinical Social Work  Clinical Social Work was referred by medical provider for SDOH needs.  Clinical Social Worker attempted to contact patient by phone to offer support and assess for needs.  Spoke with patient's daughter, Lolita, who stated answers all of patient's calls.     Interventions: Per referral, discussed SDOH screening.  Lolita reported patient has all of her needs met.  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  Both Lolita and her son assist patient with food and patient drives her own care.      Follow Up Plan:  Patient will contact CSW with any support or resource needs    Macario CHRISTELLA Au, LCSW  Clinical Social Worker [facility]

## 2024-07-15 ENCOUNTER — Ambulatory Visit

## 2024-07-15 VITALS — BP 109/78 | HR 54 | Temp 97.6°F | Resp 18

## 2024-07-15 DIAGNOSIS — D509 Iron deficiency anemia, unspecified: Secondary | ICD-10-CM | POA: Diagnosis not present

## 2024-07-15 DIAGNOSIS — D500 Iron deficiency anemia secondary to blood loss (chronic): Secondary | ICD-10-CM

## 2024-07-15 MED ORDER — SODIUM CHLORIDE 0.9% FLUSH
10.0000 mL | Freq: Once | INTRAVENOUS | Status: AC | PRN
  Administered 2024-07-15: 10 mL
  Filled 2024-07-15: qty 10

## 2024-07-15 MED ORDER — IRON SUCROSE 20 MG/ML IV SOLN
200.0000 mg | Freq: Once | INTRAVENOUS | Status: AC
  Administered 2024-07-15: 200 mg via INTRAVENOUS
  Filled 2024-07-15: qty 10

## 2024-07-15 NOTE — Patient Instructions (Signed)

## 2024-07-22 ENCOUNTER — Inpatient Hospital Stay

## 2024-07-22 VITALS — BP 128/55 | HR 61 | Temp 99.0°F | Resp 18

## 2024-07-22 DIAGNOSIS — D500 Iron deficiency anemia secondary to blood loss (chronic): Secondary | ICD-10-CM

## 2024-07-22 DIAGNOSIS — D509 Iron deficiency anemia, unspecified: Secondary | ICD-10-CM | POA: Diagnosis not present

## 2024-07-22 MED ORDER — IRON SUCROSE 20 MG/ML IV SOLN
200.0000 mg | Freq: Once | INTRAVENOUS | Status: AC
  Administered 2024-07-22: 200 mg via INTRAVENOUS
  Filled 2024-07-22: qty 10

## 2024-07-29 ENCOUNTER — Inpatient Hospital Stay

## 2024-07-29 VITALS — BP 129/36 | HR 54 | Temp 98.2°F | Resp 18

## 2024-07-29 DIAGNOSIS — D500 Iron deficiency anemia secondary to blood loss (chronic): Secondary | ICD-10-CM

## 2024-07-29 MED ORDER — IRON SUCROSE 20 MG/ML IV SOLN
200.0000 mg | Freq: Once | INTRAVENOUS | Status: DC
  Filled 2024-07-29: qty 10

## 2024-07-29 NOTE — Progress Notes (Signed)
 Unable to obtain iv access. Appt rescheduled

## 2024-08-05 ENCOUNTER — Inpatient Hospital Stay

## 2024-08-05 VITALS — BP 106/60 | HR 52 | Temp 98.2°F | Resp 17

## 2024-08-05 DIAGNOSIS — D509 Iron deficiency anemia, unspecified: Secondary | ICD-10-CM | POA: Diagnosis not present

## 2024-08-05 DIAGNOSIS — D500 Iron deficiency anemia secondary to blood loss (chronic): Secondary | ICD-10-CM

## 2024-08-05 MED ORDER — IRON SUCROSE 20 MG/ML IV SOLN
200.0000 mg | Freq: Once | INTRAVENOUS | Status: AC
  Administered 2024-08-05: 200 mg via INTRAVENOUS

## 2024-08-12 ENCOUNTER — Inpatient Hospital Stay: Attending: Oncology

## 2024-08-12 VITALS — BP 125/77 | HR 56 | Temp 97.3°F | Resp 18

## 2024-08-12 DIAGNOSIS — D509 Iron deficiency anemia, unspecified: Secondary | ICD-10-CM | POA: Diagnosis present

## 2024-08-12 DIAGNOSIS — D500 Iron deficiency anemia secondary to blood loss (chronic): Secondary | ICD-10-CM

## 2024-08-12 MED ORDER — IRON SUCROSE 20 MG/ML IV SOLN
200.0000 mg | Freq: Once | INTRAVENOUS | Status: AC
  Administered 2024-08-12: 200 mg via INTRAVENOUS
  Filled 2024-08-12: qty 10

## 2024-08-12 NOTE — Patient Instructions (Signed)

## 2024-10-07 ENCOUNTER — Other Ambulatory Visit: Payer: Self-pay | Admitting: Family Medicine

## 2024-10-07 DIAGNOSIS — Z1231 Encounter for screening mammogram for malignant neoplasm of breast: Secondary | ICD-10-CM

## 2024-10-11 ENCOUNTER — Inpatient Hospital Stay: Attending: Oncology

## 2024-10-11 DIAGNOSIS — D509 Iron deficiency anemia, unspecified: Secondary | ICD-10-CM | POA: Diagnosis present

## 2024-10-11 DIAGNOSIS — K7460 Unspecified cirrhosis of liver: Secondary | ICD-10-CM

## 2024-10-11 DIAGNOSIS — K921 Melena: Secondary | ICD-10-CM

## 2024-10-11 DIAGNOSIS — D500 Iron deficiency anemia secondary to blood loss (chronic): Secondary | ICD-10-CM

## 2024-10-11 LAB — RETIC PANEL
Immature Retic Fract: 13.4 % (ref 2.3–15.9)
RBC.: 4.44 MIL/uL (ref 3.87–5.11)
Retic Count, Absolute: 69.3 K/uL (ref 19.0–186.0)
Retic Ct Pct: 1.6 % (ref 0.4–3.1)
Reticulocyte Hemoglobin: 27.8 pg — ABNORMAL LOW (ref >27.9)

## 2024-10-11 LAB — IRON AND TIBC
Iron: 31 ug/dL (ref 28–170)
Saturation Ratios: 7 % — ABNORMAL LOW (ref 10.4–31.8)
TIBC: 473 ug/dL — ABNORMAL HIGH (ref 250–450)
UIBC: 442 ug/dL

## 2024-10-11 LAB — CBC (CANCER CENTER ONLY)
HCT: 36.3 % (ref 36.0–46.0)
Hemoglobin: 11.3 g/dL — ABNORMAL LOW (ref 12.0–15.0)
MCH: 25 pg — ABNORMAL LOW (ref 26.0–34.0)
MCHC: 31.1 g/dL (ref 30.0–36.0)
MCV: 80.3 fL (ref 80.0–100.0)
Platelet Count: 156 K/uL (ref 150–400)
RBC: 4.52 MIL/uL (ref 3.87–5.11)
RDW: 14.9 % (ref 11.5–15.5)
WBC Count: 6.3 K/uL (ref 4.0–10.5)
nRBC: 0 % (ref 0.0–0.2)

## 2024-10-11 LAB — FERRITIN: Ferritin: 18 ng/mL (ref 11–307)

## 2024-10-14 ENCOUNTER — Inpatient Hospital Stay: Admitting: Oncology

## 2024-10-14 ENCOUNTER — Inpatient Hospital Stay

## 2024-10-14 ENCOUNTER — Encounter: Payer: Self-pay | Admitting: Oncology

## 2024-10-14 VITALS — BP 134/70 | HR 55 | Resp 18

## 2024-10-14 VITALS — BP 129/61 | HR 55 | Temp 97.6°F | Resp 18 | Wt 187.7 lb

## 2024-10-14 DIAGNOSIS — D500 Iron deficiency anemia secondary to blood loss (chronic): Secondary | ICD-10-CM

## 2024-10-14 DIAGNOSIS — K7460 Unspecified cirrhosis of liver: Secondary | ICD-10-CM

## 2024-10-14 DIAGNOSIS — K921 Melena: Secondary | ICD-10-CM | POA: Diagnosis not present

## 2024-10-14 DIAGNOSIS — D509 Iron deficiency anemia, unspecified: Secondary | ICD-10-CM | POA: Diagnosis not present

## 2024-10-14 DIAGNOSIS — K7682 Hepatic encephalopathy: Secondary | ICD-10-CM | POA: Diagnosis not present

## 2024-10-14 MED ORDER — IRON SUCROSE 20 MG/ML IV SOLN
200.0000 mg | Freq: Once | INTRAVENOUS | Status: AC
  Administered 2024-10-14: 200 mg via INTRAVENOUS
  Filled 2024-10-14: qty 10

## 2024-10-14 NOTE — Progress Notes (Signed)
 Hematology/Oncology Consult note [phone] [phone]        REFERRING PROVIDER: Valora Lynwood FALCON, MD   CHIEF COMPLAINTS/REASON FOR VISIT:  Evaluation of iron  deficiency anemia.    ASSESSMENT & PLAN:   Iron  deficiency anemia Labs are reviewed and discussed with patient. Lab Results  Component Value Date   HGB 11.3 (L) 10/11/2024   TIBC 473 (H) 10/11/2024   IRONPCTSAT 7 (L) 10/11/2024   FERRITIN 18 10/11/2024    Improved hemoglobin, persistent iron  deficient.  I recommend IV Venofer  weekly x 3 to further improve her iron  stores.  Cirrhosis of liver without ascites (HCC) Recommend patient to follow-up with gastroenterology    Blood in the stool Recommend patient to follow up with GI  Hepatic encephalopathy (HCC) Not complaint with lactulose .  Discussed about importance of medication compliance.    Orders Placed This Encounter  Procedures   CBC with Differential (Cancer Center Only)    Standing Status:   Future    Expected Date:   01/14/2025    Expiration Date:   04/14/2025   Iron  and TIBC    Standing Status:   Future    Expected Date:   01/14/2025    Expiration Date:   04/14/2025   Ferritin    Standing Status:   Future    Expected Date:   01/14/2025    Expiration Date:   04/14/2025   Follow-up in 3 months. All questions were answered. The patient knows to call the clinic with any problems, questions or concerns.  Laura Cap, MD, PhD [facility] Hematology Oncology 10/14/2024   HISTORY OF PRESENTING ILLNESS:   Laura [name] is a  73 y.o.  female with PMH listed below was seen in consultation at the request of  Laura Lynwood FALCON, MD  for evaluation of iron  deficiency anemia.   Discussed the use of AI scribe software for clinical note transcription with the patient, who gave verbal consent to proceed.   [name] was connected during the encounter per patient's request.  Patient  has a history of chronic anemia since at least 2021 and has been  on oral iron  supplements for several years.  June 2025 she was hospitalized due to acute CHF.  06/06/2024 iron  panel showed saturation of 6, TIBC 468. 06/07/2024 ferritin 36 Hb 7.8.  She received f Venofer   tolerated well without any side effects.     INTERVAL HISTORY Laura [name] is a 73 y.o. female who has above history reviewed by me today presents for follow up visit for iron  deficiency anemia.   She reports occasional blood in her stool. She has a history of liver cirrhosis and hepatic encephalopathy.  She was seen by GI Dr. Aundria in January 2025 and was recommend to get EGD/colonoscopy done.  She is overdue.   She appears to be slightly confused today. I called [name] Laura [name] reports that patient is not compliant with lactulose .    MEDICAL HISTORY:  Past Medical History:  Diagnosis Date   Allergy    Anxiety    Asthma    Chronic back pain    Chronic cough 07/01/10   PFT  FEV1 2.20 (93%), FEV 1% 81, TLC 4,12 (81%), DLCO 78%, no BD. 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   Depression    Diabetes mellitus type 2, uncomplicated (HCC)    Diabetes mellitus without complication (HCC)    Diabetic neuropathy (HCC)    Diverticulosis    Dysrhythmia  H/O PALPITATIONS-SINCE ABLATION IN 2013 AT [facility], PALPITATIONS MUCH BETTER   GERD (gastroesophageal reflux disease)    NO MEDS   Hemorrhoids    HTN (hypertension)    NAFLD (nonalcoholic fatty liver disease) 97/88/7980   Neuropathy    Osteoarthritis    Sleep apnea    NO CPAP    SURGICAL HISTORY: Past Surgical History:  Procedure Laterality Date   BRONCHOSCOPY  07/25/10   CARDIAC ELECTROPHYSIOLOGY STUDY AND ABLATION     CARPAL TUNNEL RELEASE     CATARACT EXTRACTION Bilateral Jan 2017   cathater ablation     for arrhythmia   COLONOSCOPY  2014   COLONOSCOPY WITH PROPOFOL  N/A 09/07/2019   Procedure: COLONOSCOPY WITH PROPOFOL ;  Surgeon: Toledo, Ladell POUR, MD;  Location: [facility]  ENDOSCOPY;  Service: Gastroenterology;  Laterality: N/A;   ESOPHAGOGASTRODUODENOSCOPY (EGD) WITH PROPOFOL  N/A 07/05/2018   Procedure: ESOPHAGOGASTRODUODENOSCOPY (EGD) WITH PROPOFOL ;  Surgeon: Viktoria Lamar DASEN, MD;  Location: [facility] ENDOSCOPY;  Service: Endoscopy;  Laterality: N/A;   FOOT SURGERY     HEMORROIDECTOMY     LIPOMA EXCISION Right 10/20/2016   Procedure: EXCISION LIPOMA;  Surgeon: Louanne KANDICE Muse, MD;  Location: [facility] ORS;  Service: General;  Laterality: Right;   PARTIAL HYSTERECTOMY     TOENAIL EXCISION Right     SOCIAL HISTORY: Social History   Socioeconomic History   Marital status: Widowed    Spouse name: Not on file   Number of children: Not on file   Years of education: Not on file   Highest education level: Not on file  Occupational History   Not on file  Tobacco Use   Smoking status: Never   Smokeless tobacco: Never  Vaping Use   Vaping status: Never Used  Substance and Sexual Activity   Alcohol use: No    Alcohol/week: 0.0 standard drinks of alcohol   Drug use: No   Sexual activity: Not on file  Other Topics Concern   Not on file  Social History Narrative   Married, 2 children. Works in day care center   Cell: [phone]    Social Drivers of Health   Financial Resource Strain: Medium Risk (07/08/2024)   Overall Financial Resource Strain (CARDIA)    Difficulty of Paying Living Expenses: Somewhat hard  Food Insecurity: Food Insecurity Present (07/08/2024)   Hunger Vital Sign    Worried About Running Out of Food in the Last Year: Often true    Ran Out of Food in the Last Year: Often true  Transportation Needs: No Transportation Needs (07/08/2024)   PRAPARE - Administrator, Civil Service (Medical): No    Lack of Transportation (Non-Medical): No  Recent Concern: Transportation Needs - Unmet Transportation Needs (04/25/2024)   Received from [facility] - Transportation    In the past 12 months, has lack of  transportation kept you from medical appointments or from getting medications?: Yes    Lack of Transportation (Non-Medical): No  Physical Activity: Not on file  Stress: Stress Concern Present (07/08/2024)   [employer] of Occupational Health - Occupational Stress Questionnaire    Feeling of Stress: To some extent  Social Connections: Socially Isolated (06/06/2024)   Social Connection and Isolation Panel    Frequency of Communication with Friends and Family: Never    Frequency of Social Gatherings with Friends and Family: Never    Attends Religious Services: 1 to 4 times per year    Active Member of Clubs or  Organizations: No    Attends Banker Meetings: Never    Marital Status: Widowed  Intimate Partner Violence: Not At Risk (07/08/2024)   Humiliation, Afraid, Rape, and Kick questionnaire    Fear of Current or Ex-Partner: No    Emotionally Abused: No    Physically Abused: No    Sexually Abused: No    FAMILY HISTORY: Family History  Problem Relation Age of Onset   Cancer Mother        stomach   Diabetes Mother    Cancer Father        brain   Heart disease Father    Cancer Brother        mouth   Heart disease Brother    Ovarian cancer Maternal Grandmother     ALLERGIES:  is allergic to atenolol, clarithromycin, doxycycline, codeine, erythromycin base, prednisone, and penicillins.  MEDICATIONS:  Current Outpatient Medications  Medication Sig Dispense Refill   ferrous sulfate  325 (65 FE) MG tablet Take 1 tablet (325 mg total) by mouth daily with breakfast. 30 tablet 2   furosemide  (LASIX ) 40 MG tablet Take 1 tablet (40 mg total) by mouth daily. 30 tablet 2   gabapentin  (NEURONTIN ) 300 MG capsule Take 2 capsules (600 mg total) by mouth 4 (four) times daily. 240 capsule 0   glipiZIDE  (GLUCOTROL ) 10 MG tablet Take 1 tablet (10 mg total) by mouth daily before breakfast. 30 tablet 0   losartan  (COZAAR ) 50 MG tablet Take 50 mg by mouth daily.     metFORMIN   (GLUCOPHAGE ) 500 MG tablet Take 500 mg by mouth 2 (two) times daily.     metoprolol  tartrate (LOPRESSOR ) 25 MG tablet Take 1 tablet (25 mg total) by mouth 2 (two) times daily. 60 tablet 0   NOVOLOG  FLEXPEN 100 UNIT/ML FlexPen Inject 0-60 Units into the skin 3 (three) times daily with meals. 60 units every morning and Sliding Scale for lunch and supper (0-24 units)     pantoprazole  (PROTONIX ) 40 MG tablet Take 1 tablet (40 mg total) by mouth daily. 30 tablet 2   rosuvastatin  (CRESTOR ) 10 MG tablet Take 10 mg by mouth daily.     sertraline  (ZOLOFT ) 100 MG tablet Take 100 mg by mouth every morning.     spironolactone  (ALDACTONE ) 25 MG tablet Take 1 tablet (25 mg total) by mouth daily. 30 tablet 0   No current facility-administered medications for this visit.    Review of Systems  Constitutional:  Positive for fatigue. Negative for appetite change, chills and fever.  HENT:   Negative for hearing loss and voice change.   Eyes:  Negative for eye problems.  Respiratory:  Negative for chest tightness and cough.   Cardiovascular:  Negative for chest pain.  Gastrointestinal:  Positive for blood in stool. Negative for abdominal distention and abdominal pain.  Endocrine: Negative for hot flashes.  Genitourinary:  Negative for difficulty urinating and frequency.   Musculoskeletal:  Negative for arthralgias.  Skin:  Negative for itching and rash.  Neurological:  Negative for extremity weakness.  Hematological:  Negative for adenopathy.  Psychiatric/Behavioral:  Negative for confusion.    PHYSICAL EXAMINATION:  Vitals:   10/14/24 1103  BP: 129/61  Pulse: (!) 55  Resp: 18  Temp: 97.6 F (36.4 C)   Filed Weights   10/14/24 1103  Weight: 187 lb 11.2 oz (85.1 kg)    Physical Exam Constitutional:      General: She is not in acute distress. HENT:  Head: Normocephalic and atraumatic.  Eyes:     General: No scleral icterus. Cardiovascular:     Rate and Rhythm: Normal rate and regular  rhythm.     Heart sounds: Normal heart sounds.  Pulmonary:     Effort: Pulmonary effort is normal. No respiratory distress.     Breath sounds: No wheezing.  Abdominal:     General: Bowel sounds are normal. There is no distension.     Palpations: Abdomen is soft.  Musculoskeletal:        General: No deformity. Normal range of motion.     Cervical back: Normal range of motion and neck supple.  Skin:    General: Skin is warm and dry.     Findings: No erythema or rash.  Neurological:     Mental Status: She is alert.     Comments: Slightly confused. Oriented x 2  Psychiatric:        Mood and Affect: Mood normal.     LABORATORY DATA:  I have reviewed the data as listed    Latest Ref Rng & Units 10/11/2024   10:26 AM 07/08/2024   10:26 AM 06/08/2024    4:01 AM  CBC  WBC 4.0 - 10.5 K/uL 6.3  5.3  4.4   Hemoglobin 12.0 - 15.0 g/dL 88.6  89.3  8.1   Hematocrit 36.0 - 46.0 % 36.3  34.9  26.6   Platelets 150 - 400 K/uL 156  152  170       Latest Ref Rng & Units 06/08/2024    4:01 AM 06/07/2024    3:44 AM 06/06/2024    2:21 PM  CMP  Glucose 70 - 99 mg/dL 861  874  772   BUN 8 - 23 mg/dL 21  21  21    Creatinine 0.44 - 1.00 mg/dL 9.24  9.31  9.25   Sodium 135 - 145 mmol/L 141  142  143   Potassium 3.5 - 5.1 mmol/L 3.6  3.9  4.0   Chloride 98 - 111 mmol/L 103  105  108   CO2 22 - 32 mmol/L 30  29  25    Calcium  8.9 - 10.3 mg/dL 8.6  8.7  8.8   Total Protein 6.5 - 8.1 g/dL   5.8   Total Bilirubin 0.0 - 1.2 mg/dL   0.6   Alkaline Phos 38 - 126 U/L   65   AST 15 - 41 U/L   30   ALT 0 - 44 U/L   16       RADIOGRAPHIC STUDIES: I have personally reviewed the radiological images as listed and agreed with the findings in the report. No results found.

## 2024-10-14 NOTE — Assessment & Plan Note (Signed)
 Recommend patient to follow up with GI

## 2024-10-14 NOTE — Progress Notes (Signed)
 Patient tolerated iron infusion with no complaints voiced.  Peripheral IV site clean and dry with good blood return noted before and after infusion.  Band aid applied. Pt observed for 30 minutes post iron without complications.  VSS with discharge and left in satisfactory condition with no s/s of distress noted. All follow ups as scheduled.   Laura [name]

## 2024-10-14 NOTE — Assessment & Plan Note (Addendum)
 Labs are reviewed and discussed with patient. Lab Results  Component Value Date   HGB 11.3 (L) 10/11/2024   TIBC 473 (H) 10/11/2024   IRONPCTSAT 7 (L) 10/11/2024   FERRITIN 18 10/11/2024    Improved hemoglobin, persistent iron  deficient.  I recommend IV Venofer  weekly x 3 to further improve her iron  stores.

## 2024-10-14 NOTE — Assessment & Plan Note (Addendum)
 Not complaint with lactulose .  Discussed about importance of medication compliance.

## 2024-10-14 NOTE — Patient Instructions (Signed)

## 2024-10-14 NOTE — Assessment & Plan Note (Addendum)
 Recommend patient to follow-up with gastroenterology

## 2024-10-18 ENCOUNTER — Other Ambulatory Visit: Payer: Self-pay | Admitting: Internal Medicine

## 2024-10-18 DIAGNOSIS — K7460 Unspecified cirrhosis of liver: Secondary | ICD-10-CM

## 2024-10-18 DIAGNOSIS — K7682 Hepatic encephalopathy: Secondary | ICD-10-CM

## 2024-10-21 ENCOUNTER — Inpatient Hospital Stay

## 2024-10-21 VITALS — BP 114/47 | HR 59 | Temp 98.1°F | Resp 16

## 2024-10-21 DIAGNOSIS — D509 Iron deficiency anemia, unspecified: Secondary | ICD-10-CM | POA: Diagnosis not present

## 2024-10-21 DIAGNOSIS — D500 Iron deficiency anemia secondary to blood loss (chronic): Secondary | ICD-10-CM

## 2024-10-21 MED ORDER — IRON SUCROSE 20 MG/ML IV SOLN
200.0000 mg | Freq: Once | INTRAVENOUS | Status: AC
  Administered 2024-10-21: 200 mg via INTRAVENOUS
  Filled 2024-10-21: qty 10

## 2024-10-21 NOTE — Patient Instructions (Signed)

## 2024-10-28 ENCOUNTER — Inpatient Hospital Stay

## 2024-10-28 VITALS — BP 108/54 | HR 55 | Temp 97.4°F | Resp 20

## 2024-10-28 DIAGNOSIS — D509 Iron deficiency anemia, unspecified: Secondary | ICD-10-CM | POA: Diagnosis not present

## 2024-10-28 DIAGNOSIS — D500 Iron deficiency anemia secondary to blood loss (chronic): Secondary | ICD-10-CM

## 2024-10-28 MED ORDER — SODIUM CHLORIDE 0.9% FLUSH
10.0000 mL | Freq: Once | INTRAVENOUS | Status: AC | PRN
  Administered 2024-10-28: 10 mL
  Filled 2024-10-28: qty 10

## 2024-10-28 MED ORDER — IRON SUCROSE 20 MG/ML IV SOLN
200.0000 mg | Freq: Once | INTRAVENOUS | Status: AC
  Administered 2024-10-28: 200 mg via INTRAVENOUS
  Filled 2024-10-28: qty 10

## 2024-11-08 ENCOUNTER — Ambulatory Visit
Admission: RE | Admit: 2024-11-08 | Discharge: 2024-11-08 | Disposition: A | Source: Ambulatory Visit | Attending: Internal Medicine | Admitting: Internal Medicine

## 2024-11-08 ENCOUNTER — Encounter

## 2024-11-08 DIAGNOSIS — K7682 Hepatic encephalopathy: Secondary | ICD-10-CM

## 2024-11-08 DIAGNOSIS — K7460 Unspecified cirrhosis of liver: Secondary | ICD-10-CM

## 2024-11-13 ENCOUNTER — Observation Stay
Admission: EM | Admit: 2024-11-13 | Discharge: 2024-11-15 | DRG: 441 | Disposition: A | Attending: Emergency Medicine | Admitting: Emergency Medicine

## 2024-11-13 ENCOUNTER — Other Ambulatory Visit: Payer: Self-pay

## 2024-11-13 DIAGNOSIS — K7460 Unspecified cirrhosis of liver: Secondary | ICD-10-CM | POA: Diagnosis present

## 2024-11-13 DIAGNOSIS — G9340 Encephalopathy, unspecified: Secondary | ICD-10-CM | POA: Diagnosis not present

## 2024-11-13 DIAGNOSIS — K219 Gastro-esophageal reflux disease without esophagitis: Secondary | ICD-10-CM | POA: Diagnosis present

## 2024-11-13 DIAGNOSIS — E162 Hypoglycemia, unspecified: Secondary | ICD-10-CM

## 2024-11-13 DIAGNOSIS — K7682 Hepatic encephalopathy: Principal | ICD-10-CM | POA: Diagnosis present

## 2024-11-13 DIAGNOSIS — I10 Essential (primary) hypertension: Secondary | ICD-10-CM | POA: Diagnosis present

## 2024-11-13 DIAGNOSIS — E119 Type 2 diabetes mellitus without complications: Secondary | ICD-10-CM

## 2024-11-13 DIAGNOSIS — N390 Urinary tract infection, site not specified: Secondary | ICD-10-CM

## 2024-11-13 LAB — CBG MONITORING, ED
Glucose-Capillary: 96 mg/dL (ref 70–99)
Glucose-Capillary: 39 mg/dL — CL (ref 70–99)
Glucose-Capillary: 211 mg/dL — ABNORMAL HIGH (ref 70–99)
Glucose-Capillary: 86 mg/dL (ref 70–99)
Glucose-Capillary: 164 mg/dL — ABNORMAL HIGH (ref 70–99)
Glucose-Capillary: 218 mg/dL — ABNORMAL HIGH (ref 70–99)

## 2024-11-13 LAB — COMPREHENSIVE METABOLIC PANEL WITH GFR
ALT: 16 U/L (ref 0–44)
AST: 33 U/L (ref 15–41)
Albumin: 4.3 g/dL (ref 3.5–5.0)
Alkaline Phosphatase: 85 U/L (ref 38–126)
Anion gap: 12 (ref 5–15)
BUN: 18 mg/dL (ref 8–23)
CO2: 24 mmol/L (ref 22–32)
Calcium: 10.1 mg/dL (ref 8.9–10.3)
Chloride: 105 mmol/L (ref 98–111)
Creatinine, Ser: 0.84 mg/dL (ref 0.44–1.00)
GFR, Estimated: >60 mL/min (ref >60)
Glucose, Bld: 88 mg/dL (ref 70–99)
Potassium: 4.1 mmol/L (ref 3.5–5.1)
Sodium: 141 mmol/L (ref 135–145)
Total Bilirubin: 0.7 mg/dL (ref 0.0–1.2)
Total Protein: 7.1 g/dL (ref 6.5–8.1)

## 2024-11-13 LAB — URINALYSIS, ROUTINE W REFLEX MICROSCOPIC
Bilirubin Urine: NEGATIVE
Glucose, UA: >=500 mg/dL — AB
Hgb urine dipstick: NEGATIVE
Ketones, ur: NEGATIVE mg/dL
Nitrite: POSITIVE — AB
Protein, ur: NEGATIVE mg/dL
Specific Gravity, Urine: 1.025 (ref 1.005–1.030)
WBC, UA: >50 WBC/hpf (ref 0–5)
pH: 5 (ref 5.0–8.0)

## 2024-11-13 LAB — AMMONIA: Ammonia: 63 umol/L — ABNORMAL HIGH (ref 9–35)

## 2024-11-13 LAB — TROPONIN T, HIGH SENSITIVITY: Troponin T High Sensitivity: 19 ng/L (ref 0–19)

## 2024-11-13 LAB — CBC
HCT: 39.5 % (ref 36.0–46.0)
Hemoglobin: 12.3 g/dL (ref 12.0–15.0)
MCH: 25.6 pg — ABNORMAL LOW (ref 26.0–34.0)
MCHC: 31.1 g/dL (ref 30.0–36.0)
MCV: 82.3 fL (ref 80.0–100.0)
Platelets: 148 K/uL — ABNORMAL LOW (ref 150–400)
RBC: 4.8 MIL/uL (ref 3.87–5.11)
RDW: 16.6 % — ABNORMAL HIGH (ref 11.5–15.5)
WBC: 5.1 K/uL (ref 4.0–10.5)
nRBC: 0 % (ref 0.0–0.2)

## 2024-11-13 LAB — GLUCOSE, CAPILLARY: Glucose-Capillary: 192 mg/dL — ABNORMAL HIGH (ref 70–99)

## 2024-11-13 MED ORDER — ROSUVASTATIN CALCIUM 10 MG PO TABS
10.0000 mg | ORAL_TABLET | Freq: Every day | ORAL | Status: DC
  Administered 2024-11-14 – 2024-11-15 (×2): 10 mg via ORAL
  Filled 2024-11-13 (×2): qty 1

## 2024-11-13 MED ORDER — LACTULOSE 10 GM/15ML PO SOLN
20.0000 g | ORAL | Status: AC
  Administered 2024-11-13: 20 g via ORAL
  Filled 2024-11-13: qty 30

## 2024-11-13 MED ORDER — DEXTROSE 50 % IV SOLN
1.0000 | Freq: Once | INTRAVENOUS | Status: AC
  Administered 2024-11-13: 50 mL via INTRAVENOUS

## 2024-11-13 MED ORDER — FERROUS SULFATE 325 (65 FE) MG PO TABS
325.0000 mg | ORAL_TABLET | Freq: Every day | ORAL | Status: DC
  Administered 2024-11-14 – 2024-11-15 (×2): 325 mg via ORAL
  Filled 2024-11-13 (×2): qty 1

## 2024-11-13 MED ORDER — ACETAMINOPHEN 500 MG PO TABS: 500.0000 mg | ORAL_TABLET | Freq: Four times a day (QID) | ORAL | Status: DC | PRN

## 2024-11-13 MED ORDER — SERTRALINE HCL 50 MG PO TABS
100.0000 mg | ORAL_TABLET | Freq: Every morning | ORAL | Status: DC
  Administered 2024-11-14 – 2024-11-15 (×2): 100 mg via ORAL
  Filled 2024-11-13 (×2): qty 2

## 2024-11-13 MED ORDER — GABAPENTIN 300 MG PO CAPS
600.0000 mg | ORAL_CAPSULE | Freq: Four times a day (QID) | ORAL | Status: DC
  Administered 2024-11-13 – 2024-11-15 (×6): 600 mg via ORAL
  Filled 2024-11-13 (×6): qty 2

## 2024-11-13 MED ORDER — ONDANSETRON HCL 4 MG PO TABS: 4.0000 mg | ORAL_TABLET | Freq: Four times a day (QID) | ORAL | Status: DC | PRN

## 2024-11-13 MED ORDER — ONDANSETRON HCL 4 MG/2ML IJ SOLN: 4.0000 mg | Freq: Four times a day (QID) | INTRAMUSCULAR | Status: DC | PRN

## 2024-11-13 MED ORDER — INSULIN ASPART 100 UNIT/ML IJ SOLN
0.0000 [IU] | Freq: Three times a day (TID) | INTRAMUSCULAR | Status: DC
  Administered 2024-11-14 – 2024-11-15 (×3): 1 [IU] via SUBCUTANEOUS
  Filled 2024-11-13 (×3): qty 1

## 2024-11-13 MED ORDER — HEPARIN SODIUM (PORCINE) 5000 UNIT/ML IJ SOLN
5000.0000 [IU] | Freq: Three times a day (TID) | INTRAMUSCULAR | Status: DC
  Administered 2024-11-13 – 2024-11-15 (×5): 5000 [IU] via SUBCUTANEOUS
  Filled 2024-11-13 (×5): qty 1

## 2024-11-13 MED ORDER — SODIUM CHLORIDE 0.9 % IV SOLN
1.0000 g | INTRAVENOUS | Status: AC
  Administered 2024-11-13: 1 g via INTRAVENOUS
  Filled 2024-11-13: qty 10

## 2024-11-13 MED ORDER — INSULIN ASPART 100 UNIT/ML IJ SOLN: 0.0000 [IU] | Freq: Every day | INTRAMUSCULAR | Status: DC

## 2024-11-13 MED ORDER — LACTULOSE 10 GM/15ML PO SOLN
20.0000 g | Freq: Three times a day (TID) | ORAL | Status: DC
  Administered 2024-11-13 – 2024-11-15 (×5): 20 g via ORAL
  Filled 2024-11-13 (×5): qty 30

## 2024-11-13 MED ORDER — SODIUM CHLORIDE 0.9% FLUSH
3.0000 mL | Freq: Two times a day (BID) | INTRAVENOUS | Status: DC
  Administered 2024-11-13 – 2024-11-15 (×4): 3 mL via INTRAVENOUS

## 2024-11-13 MED ORDER — SODIUM CHLORIDE 0.9 % IV SOLN
1.0000 g | INTRAVENOUS | Status: DC
  Administered 2024-11-14: 1 g via INTRAVENOUS
  Filled 2024-11-13 (×2): qty 10

## 2024-11-13 MED ORDER — PANTOPRAZOLE SODIUM 40 MG PO TBEC
40.0000 mg | DELAYED_RELEASE_TABLET | Freq: Every day | ORAL | Status: DC
  Administered 2024-11-14 – 2024-11-15 (×2): 40 mg via ORAL
  Filled 2024-11-13 (×2): qty 1

## 2024-11-13 MED ORDER — SENNOSIDES-DOCUSATE SODIUM 8.6-50 MG PO TABS: 1.0000 | ORAL_TABLET | Freq: Every evening | ORAL | Status: DC | PRN

## 2024-11-13 NOTE — ED Triage Notes (Addendum)
 Pt comes via [facility] from home with c/o hypoglycemia. Pt was on cough unresponsive and diaphoretic,. CBG was 55 by Fire, pt was able to follow commands and intake was 15 gram glucose and following sugar was 68 and then later 130 by EMS.   Pt also c/o dizziness few days prior with family.

## 2024-11-13 NOTE — ED Provider Notes (Signed)
 [facility] Provider Note    Event Date/Time   First MD Initiated Contact with Patient 11/13/24 1502     (approximate)   History   Hypoglycemia   HPI  Laura [name] is a 73 y.o. female with a complex medical history including diabetes, fatty liver, metabolic syndrome, hypertension, anxiety, prior encephalopathy with hepatic encephalopathy CHF, cirrhosis  Per PCP notes She is on Lantus  60 units in the morning and Novolog  14-16 units with meals. She does not take any insulin  for breakfast. She is also on glipizide  XL 10 mg daily, MTF 500 mg bid and Jardiance  10 mg daily. No changes made on 11/09/24 visit     Has been a bit fatigued not eating too well for a few days.  Daughter found her with decreased responsiveness on the couch.  Noted to have hypoglycemia.  Corrected with improvement but still feeling fatigued reports not eating and drinking too well for couple of days  She also has been noticed by her daughter to have a little bit of occasional tremor in her hands or feet at times and it is reminiscent of times when her liver has caused problems because she has not had her lactulose .  She has not had lactulose  today, and they believe she took her long-acting insulin  this morning but has not anything to eat or drink they were up long last night because a family ember had to come to the ER  Physical Exam   Triage Vital Signs: ED Triage Vitals  Encounter Vitals Group     BP 11/13/24 1225 (!) 100/57     Girls Systolic BP Percentile --      Girls Diastolic BP Percentile --      Boys Systolic BP Percentile --      Boys Diastolic BP Percentile --      Pulse Rate 11/13/24 1225 63     Resp 11/13/24 1225 20     Temp 11/13/24 1225 98.3 F (36.8 C)     Temp src --      SpO2 11/13/24 1225 98 %     Weight --      Height --      Head Circumference --      Peak Flow --      Pain Score 11/13/24 1438 0     Pain Loc --      Pain Education --      Exclude  from Growth Chart --     Most recent vital signs: Vitals:   11/13/24 1225  BP: (!) 100/57  Pulse: 63  Resp: 20  Temp: 98.3 F (36.8 C)  SpO2: 98%     General: Awake, no distress.  Pleasant alert but appears fatigued but in no distress CV:  Good peripheral perfusion.  Well-oriented.  Normal tone Resp:  Normal effort.  Clear bilateral Abd:  No distention.  Soft nontender on Other:  No focal neurologic deficits.  Mild asterixis involving the upper extremities bilateral   ED Results / Procedures / Treatments   Labs (all labs ordered are listed, but only abnormal results are displayed) Labs Reviewed  CBC - Abnormal; Notable for the following components:      Result Value   MCH 25.6 (*)    RDW 16.6 (*)    Platelets 148 (*)    All other components within normal limits  URINALYSIS, ROUTINE W REFLEX MICROSCOPIC - Abnormal; Notable for the following components:   Color, Urine YELLOW (*)  APPearance CLOUDY (*)    Glucose, UA >=500 (*)    Nitrite POSITIVE (*)    Leukocytes,Ua MODERATE (*)    Bacteria, UA MANY (*)    All other components within normal limits  AMMONIA - Abnormal; Notable for the following components:   Ammonia 63 (*)    All other components within normal limits  CBG MONITORING, ED - Abnormal; Notable for the following components:   Glucose-Capillary 39 (*)    All other components within normal limits  CBG MONITORING, ED - Abnormal; Notable for the following components:   Glucose-Capillary 211 (*)    All other components within normal limits  COMPREHENSIVE METABOLIC PANEL WITH GFR  CBG MONITORING, ED  CBG MONITORING, ED  CBG MONITORING, ED  CBG MONITORING, ED  CBG MONITORING, ED  CBG MONITORING, ED  CBG MONITORING, ED  CBG MONITORING, ED  TROPONIN T, HIGH SENSITIVITY     EKG  Inter by me at 3 PM heart rate 60 QRS 90 QTc 480 Normal sinus rhythm no evidence of acute ischemia.  Mild nonspecific change   RADIOLOGY  No indication for central  imaging fully awake alert oriented, mild asterixis.  No focal deficits no headache.  No respiratory complaint   PROCEDURES:  Critical Care performed: No  Procedures   MEDICATIONS ORDERED IN ED: Medications  lactulose  (CHRONULAC ) 10 GM/15ML solution 20 g (has no administration in time range)  cefTRIAXone  (ROCEPHIN ) 1 g in sodium chloride  0.9 % 100 mL IVPB (has no administration in time range)  dextrose  50 % solution 50 mL (50 mLs Intravenous Given 11/13/24 1427)     IMPRESSION / MDM / ASSESSMENT AND PLAN / ED COURSE  I reviewed the triage vital signs and the nursing notes.                              Differential diagnosis includes, but is not limited to, probable hypoglycemia in the setting of long acting insulin  with nothing to eat or drink today but also use of glipizide , she also appears to have mild asterixis will check ammonia.  Differential diagnosis quite broad but what is apparent after evaluation is that she is evidence of urinary tract infection, metabolic panel is reassuring.  No cardio or pulmonary abnormalities.  No central neurologic features but ammonia is elevated.  Pointing towards likely mild hepatic encephalopathy, decreased oral intake, urinary tract infection hypoglycemia etc.  Patient's presentation is most consistent with acute complicated illness / injury requiring diagnostic workup.      Clinical Course as of 11/13/24 1704  Sun Nov 13, 2024  1642 Slightly elevated ammonia in this setting I suspect patient likely mildly dehydrated, not eating well today, also evidence of UTI.  She does not meet sepsis criteria.  Started on Rocephin  based on her previous culture, only low risk allergy to penicillin no history of cephalosporin allergy.  Will admit for further care and treatment as I suspect she has mild encephalopathy developing in the setting of UTI elevated ammonia.  She has been eating is awake alert and oriented her blood sugars we will continue to  trend currently 83 [MQ]    Clinical Course User Index [MQ] Dicky Anes, MD    Patient accepted admission by Dr. Fernand.  Patient understanding agreeable with plan for admission FINAL CLINICAL IMPRESSION(S) / ED DIAGNOSES   Final diagnoses:  Hepatic encephalopathy (HCC)  Urinary tract infection, acute  Hypoglycemia     Rx /  DC Orders   ED Discharge Orders     None        Note:  This document was prepared using Dragon voice recognition software and may include unintentional dictation errors.   Dicky Anes, MD 11/13/24 [id number]

## 2024-11-13 NOTE — ED Notes (Signed)
BG 211

## 2024-11-13 NOTE — H&P (Addendum)
 History and Physical    Laura [name] FMW:[id number] DOB: [birth year] DOA: 11/13/2024  DOS: the patient was seen and examined on 11/13/2024  PCP: Null Lynwood FALCON, MD   Patient coming from: Home  I have personally briefly reviewed patient's old medical records in [facility] and CareEverywhere  HPI:   Laura [name] is a 73 y.o. 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   Pt able to provide history upon my evaluation. She states she has been feeling dizzy over the last week and her appetite is decreased. She has been adherent to her medications. When I inquired about her lactulose , she states she has been adherent to that as well. She reports only having 1-2 bowel movements daily and no bowel movements today.    On arrival to the ED patient was noted to be HDS stable. Lab work obtained. CBC without leukocytosis and slight thrombocytopenia. CMP unremarkable. Ammonia level elevated at 63. UA with signs of UTI. Pt initially found to be hypoglycemic in the 50s by EMS. Given pts confusion that appears to be multifactorial, [facility] contacted for admission.  Review of Systems: As mentioned in the history of present illness. All other systems reviewed and are negative.   Past Medical History:  Diagnosis Date   Allergy    Anxiety    Asthma    Chronic back pain    Chronic cough 07/01/10   PFT  FEV1 2.20 (93%), FEV 1% 81, TLC 4,12 (81%), DLCO 78%, no BD. 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   Depression    Diabetes mellitus type 2, uncomplicated (HCC)    Diabetes mellitus without complication (HCC)    Diabetic neuropathy (HCC)    Diverticulosis    Dysrhythmia    H/O PALPITATIONS-SINCE ABLATION IN 2013 AT [facility], PALPITATIONS MUCH BETTER   GERD (gastroesophageal reflux disease)    NO MEDS   Hemorrhoids    HTN (hypertension)    NAFLD  (nonalcoholic fatty liver disease) 97/88/7980   Neuropathy    Osteoarthritis    Sleep apnea    NO CPAP    Past Surgical History:  Procedure Laterality Date   BRONCHOSCOPY  07/25/10   CARDIAC ELECTROPHYSIOLOGY STUDY AND ABLATION     CARPAL TUNNEL RELEASE     CATARACT EXTRACTION Bilateral Jan 2017   cathater ablation     for arrhythmia   COLONOSCOPY  2014   COLONOSCOPY WITH PROPOFOL  N/A 09/07/2019   Procedure: COLONOSCOPY WITH PROPOFOL ;  Surgeon: Toledo, Ladell POUR, MD;  Location: [facility] ENDOSCOPY;  Service: Gastroenterology;  Laterality: N/A;   ESOPHAGOGASTRODUODENOSCOPY (EGD) WITH PROPOFOL  N/A 07/05/2018   Procedure: ESOPHAGOGASTRODUODENOSCOPY (EGD) WITH PROPOFOL ;  Surgeon: Viktoria Lamar DASEN, MD;  Location: [facility] ENDOSCOPY;  Service: Endoscopy;  Laterality: N/A;   FOOT SURGERY     HEMORROIDECTOMY     LIPOMA EXCISION Right 10/20/2016   Procedure: EXCISION LIPOMA;  Surgeon: Louanne KANDICE Muse, MD;  Location: [facility] ORS;  Service: General;  Laterality: Right;   PARTIAL HYSTERECTOMY     TOENAIL EXCISION Right      Allergies  Allergen Reactions   Atenolol Palpitations   Clarithromycin Other (See Comments) and Nausea And Vomiting    Makes the tongue raw REACTION: nausea   Doxycycline Palpitations    Makes her heart beat fast Other reaction(s): GI Upset (intolerance) Loose bowels REACTION: loose stool   Codeine  Itching   Erythromycin Base Nausea And Vomiting   Prednisone Other (See Comments)    Mood swings   Penicillins Rash    Has patient had a PCN reaction causing immediate rash, facial/tongue/throat swelling, SOB or lightheadedness with hypotension: Yes Has patient had a PCN reaction causing severe rash involving mucus membranes or skin necrosis: No Has patient had a PCN reaction that required hospitalization No Has patient had a PCN reaction occurring within the last 10 years: No If all of the above answers are NO, then may proceed with Cephalosporin use.  REACTION: rash      Family History  Problem Relation Age of Onset   Cancer Mother        stomach   Diabetes Mother    Cancer Father        brain   Heart disease Father    Cancer Brother        mouth   Heart disease Brother    Ovarian cancer Maternal Grandmother     Prior to Admission medications   Medication Sig Start Date End Date Taking? Authorizing Provider  ferrous sulfate  325 (65 FE) MG tablet Take 1 tablet (325 mg total) by mouth daily with breakfast. 06/08/24 10/14/24  Maree Hue, MD  furosemide  (LASIX ) 40 MG tablet Take 1 tablet (40 mg total) by mouth daily. 06/09/24 10/14/24  Maree Hue, MD  gabapentin  (NEURONTIN ) 300 MG capsule Take 2 capsules (600 mg total) by mouth 4 (four) times daily. 06/08/24 10/14/24  Maree Hue, MD  glipiZIDE  (GLUCOTROL ) 10 MG tablet Take 1 tablet (10 mg total) by mouth daily before breakfast. 06/08/24 10/14/24  Maree Hue, MD  losartan  (COZAAR ) 50 MG tablet Take 50 mg by mouth daily. 12/13/20   [provider]  metFORMIN  (GLUCOPHAGE ) 500 MG tablet Take 500 mg by mouth 2 (two) times daily. 12/15/20   [provider]  metoprolol  tartrate (LOPRESSOR ) 25 MG tablet Take 1 tablet (25 mg total) by mouth 2 (two) times daily. 06/08/24 10/14/24  Maree Hue, MD  NOVOLOG  FLEXPEN 100 UNIT/ML FlexPen Inject 0-60 Units into the skin 3 (three) times daily with meals. 60 units every morning and Sliding Scale for lunch and supper (0-24 units) 02/10/23   [provider]  pantoprazole  (PROTONIX ) 40 MG tablet Take 1 tablet (40 mg total) by mouth daily. 04/30/21 10/14/24  Arlice Reichert, MD  rosuvastatin  (CRESTOR ) 10 MG tablet Take 10 mg by mouth daily. 11/17/20   [provider]  sertraline  (ZOLOFT ) 100 MG tablet Take 100 mg by mouth every morning. 05/14/24   [provider]  spironolactone  (ALDACTONE ) 25 MG tablet Take 1 tablet (25 mg total) by mouth daily. 06/09/24 10/14/24  Maree Hue, MD    Social History:  reports that she has never smoked. She has never used  smokeless tobacco. She reports that she does not drink alcohol and does not use drugs.    Physical Exam: Vitals:   11/13/24 1225  BP: (!) 100/57  Pulse: 63  Resp: 20  Temp: 98.3 F (36.8 C)  SpO2: 98%   Gen: NAD HENT: NCAT CV: RRR, good radial pulse Resp: CTAB Abd: No TTP, normal bowel sounds, no ascities appreciated MSK: no asymmetry Skin: no rash or other lesions noted on examined skin Neuro: alert and oriented, asterixis present Psych: pleasant mood   Labs on Admission: I have personally reviewed following labs and imaging studies  CBC: Recent Labs  Lab 11/13/24 1218  WBC 5.1  HGB 12.3  HCT 39.5  MCV 82.3  PLT 148*   Basic Metabolic Panel: Recent Labs  Lab 11/13/24 1218  NA 141  K 4.1  CL 105  CO2 24  GLUCOSE 88  BUN 18  CREATININE 0.84  CALCIUM  10.1   GFR: CrCl cannot be calculated (Unknown ideal weight.). Liver Function Tests: Recent Labs  Lab 11/13/24 1218  AST 33  ALT 16  ALKPHOS 85  BILITOT 0.7  PROT 7.1  ALBUMIN 4.3   No results for input(s): LIPASE, AMYLASE in the last 168 hours. Recent Labs  Lab 11/13/24 1610  AMMONIA 63*   Coagulation Profile: No results for input(s): INR, PROTIME in the last 168 hours. Cardiac Enzymes: No results for input(s): CKTOTAL, CKMB, CKMBINDEX, TROPONINI, TROPONINIHS in the last 168 hours. BNP (last 3 results) Recent Labs    06/06/24 1421  BNP 99.9   HbA1C: No results for input(s): HGBA1C in the last 72 hours. CBG: Recent Labs  Lab 11/13/24 1225 11/13/24 1415 11/13/24 1438 11/13/24 1615  GLUCAP 96 39* 211* 86   Lipid Profile: No results for input(s): CHOL, HDL, LDLCALC, TRIG, CHOLHDL, LDLDIRECT in the last 72 hours. Thyroid  Function Tests: No results for input(s): TSH, T4TOTAL, FREET4, T3FREE, THYROIDAB in the last 72 hours. Anemia Panel: No results for input(s): VITAMINB12, FOLATE, FERRITIN, TIBC, IRON , RETICCTPCT in the last 72  hours. Urine analysis:    Component Value Date/Time   COLORURINE YELLOW (A) 11/13/2024 1558   APPEARANCEUR CLOUDY (A) 11/13/2024 1558   LABSPEC 1.025 11/13/2024 1558   PHURINE 5.0 11/13/2024 1558   GLUCOSEU >=500 (A) 11/13/2024 1558   HGBUR NEGATIVE 11/13/2024 1558   BILIRUBINUR NEGATIVE 11/13/2024 1558   KETONESUR NEGATIVE 11/13/2024 1558   PROTEINUR NEGATIVE 11/13/2024 1558   NITRITE POSITIVE (A) 11/13/2024 1558   LEUKOCYTESUR MODERATE (A) 11/13/2024 1558    Radiological Exams on Admission: I have personally reviewed images No results found.  EKG: My personal interpretation of EKG shows: NSR without any acute ST changes . Slightly prolonged Qtc at 479    Assessment/Plan Principal Problem:   Acute encephalopathy Active Problems:   Essential hypertension   GERD   Type 2 diabetes mellitus (HCC)   Hepatic encephalopathy (HCC)   Cirrhosis of liver without ascites (HCC)   Pt with acute encephalopathy that is multifactorial but likely driven by his hypoglycemia. He has received D50% and glucose is improved. Will monitor closely. There may be some developing encephalopathy from UTI vs hepatic encephalopathy that led to hypoglycemic event. Will treat with abx and lactoluse. Monitor pts mental status and culture data.   Decompensated Cirrhosis: plan as above and will restart home diuretic once blood pressure improves. Has followed up with GI outpatient. The plan is for repeat endoscopy that pt is deferring currently.   HTN: hold antihypertensive and monitor blood pressure. Her blood pressure are borderline hypotensive and may need adjustment to her antihypertensives.   Interval update: pts orthostatic vitals were obtained and were positive. Will hold diuretic and antihypertensive today and start slowly. May need to decrease her losartan  do prevent hypotension.   GERD: continue home PPI.   T2DM: on insulin  outpatient. Given hypoglycemic event, will monitor glucose and hold starting  off SSI at this time but if glucose is consistently elevated, will start SSI.  Holding antihypertensive given hypotensive blood pressures and positive orthostatic vitals and holding antihyperglycemic agents given severe hypoglycemic event and monitoring glucose. Will start sSSI. Can restart home regimen as able.   VTE prophylaxis:  SQ Heparin   Diet: HH  Code Status:  Full Code Telemetry:  Admission status: Observation, Med-Surg Patient is from: Home Anticipated d/c is to: Home Anticipated d/c is in: 1-2 days   Family Communication: Updated at bedside  Consults called: None   Severity of Illness: The appropriate patient status for this patient is OBSERVATION. Observation status is judged to be reasonable and necessary in order to provide the required intensity of service to ensure the patient's safety. The patient's presenting symptoms, physical exam findings, and initial radiographic and laboratory data in the context of their medical condition is felt to place them at decreased risk for further clinical deterioration. Furthermore, it is anticipated that the patient will be medically stable for discharge from the hospital within 2 midnights of admission.    Morene Bathe, MD [facility] DEL. [facility]

## 2024-11-14 DIAGNOSIS — F419 Anxiety disorder, unspecified: Secondary | ICD-10-CM | POA: Diagnosis present

## 2024-11-14 DIAGNOSIS — E11649 Type 2 diabetes mellitus with hypoglycemia without coma: Secondary | ICD-10-CM | POA: Diagnosis present

## 2024-11-14 DIAGNOSIS — Z9841 Cataract extraction status, right eye: Secondary | ICD-10-CM | POA: Diagnosis not present

## 2024-11-14 DIAGNOSIS — I110 Hypertensive heart disease with heart failure: Secondary | ICD-10-CM | POA: Diagnosis present

## 2024-11-14 DIAGNOSIS — K7682 Hepatic encephalopathy: Secondary | ICD-10-CM | POA: Diagnosis present

## 2024-11-14 DIAGNOSIS — Z794 Long term (current) use of insulin: Secondary | ICD-10-CM | POA: Diagnosis not present

## 2024-11-14 DIAGNOSIS — F32A Depression, unspecified: Secondary | ICD-10-CM | POA: Diagnosis present

## 2024-11-14 DIAGNOSIS — E1140 Type 2 diabetes mellitus with diabetic neuropathy, unspecified: Secondary | ICD-10-CM | POA: Diagnosis present

## 2024-11-14 DIAGNOSIS — Z9842 Cataract extraction status, left eye: Secondary | ICD-10-CM | POA: Diagnosis not present

## 2024-11-14 DIAGNOSIS — Z880 Allergy status to penicillin: Secondary | ICD-10-CM | POA: Diagnosis not present

## 2024-11-14 DIAGNOSIS — N390 Urinary tract infection, site not specified: Secondary | ICD-10-CM | POA: Diagnosis not present

## 2024-11-14 DIAGNOSIS — E785 Hyperlipidemia, unspecified: Secondary | ICD-10-CM | POA: Diagnosis present

## 2024-11-14 DIAGNOSIS — K7460 Unspecified cirrhosis of liver: Secondary | ICD-10-CM | POA: Diagnosis present

## 2024-11-14 DIAGNOSIS — N3000 Acute cystitis without hematuria: Secondary | ICD-10-CM | POA: Diagnosis present

## 2024-11-14 DIAGNOSIS — I5032 Chronic diastolic (congestive) heart failure: Secondary | ICD-10-CM | POA: Diagnosis present

## 2024-11-14 DIAGNOSIS — Z833 Family history of diabetes mellitus: Secondary | ICD-10-CM | POA: Diagnosis not present

## 2024-11-14 DIAGNOSIS — G9341 Metabolic encephalopathy: Secondary | ICD-10-CM | POA: Diagnosis present

## 2024-11-14 DIAGNOSIS — Z8249 Family history of ischemic heart disease and other diseases of the circulatory system: Secondary | ICD-10-CM | POA: Diagnosis not present

## 2024-11-14 DIAGNOSIS — Z90711 Acquired absence of uterus with remaining cervical stump: Secondary | ICD-10-CM | POA: Diagnosis not present

## 2024-11-14 DIAGNOSIS — J45909 Unspecified asthma, uncomplicated: Secondary | ICD-10-CM | POA: Diagnosis present

## 2024-11-14 DIAGNOSIS — G9340 Encephalopathy, unspecified: Secondary | ICD-10-CM | POA: Diagnosis not present

## 2024-11-14 DIAGNOSIS — Z79899 Other long term (current) drug therapy: Secondary | ICD-10-CM | POA: Diagnosis not present

## 2024-11-14 DIAGNOSIS — Z7984 Long term (current) use of oral hypoglycemic drugs: Secondary | ICD-10-CM | POA: Diagnosis not present

## 2024-11-14 DIAGNOSIS — K219 Gastro-esophageal reflux disease without esophagitis: Secondary | ICD-10-CM | POA: Diagnosis present

## 2024-11-14 DIAGNOSIS — Z8041 Family history of malignant neoplasm of ovary: Secondary | ICD-10-CM | POA: Diagnosis not present

## 2024-11-14 DIAGNOSIS — Z885 Allergy status to narcotic agent status: Secondary | ICD-10-CM | POA: Diagnosis not present

## 2024-11-14 DIAGNOSIS — E162 Hypoglycemia, unspecified: Secondary | ICD-10-CM | POA: Diagnosis not present

## 2024-11-14 LAB — PROTIME-INR
INR: 1.2 (ref 0.8–1.2)
Prothrombin Time: 15.6 s — ABNORMAL HIGH (ref 11.4–15.2)

## 2024-11-14 LAB — BASIC METABOLIC PANEL WITH GFR
Anion gap: 8 (ref 5–15)
BUN: 21 mg/dL (ref 8–23)
CO2: 28 mmol/L (ref 22–32)
Calcium: 9.4 mg/dL (ref 8.9–10.3)
Chloride: 105 mmol/L (ref 98–111)
Creatinine, Ser: 0.87 mg/dL (ref 0.44–1.00)
GFR, Estimated: >60 mL/min (ref >60)
Glucose, Bld: 146 mg/dL — ABNORMAL HIGH (ref 70–99)
Potassium: 4.6 mmol/L (ref 3.5–5.1)
Sodium: 141 mmol/L (ref 135–145)

## 2024-11-14 LAB — CBC
HCT: 37.1 % (ref 36.0–46.0)
Hemoglobin: 11.5 g/dL — ABNORMAL LOW (ref 12.0–15.0)
MCH: 25.8 pg — ABNORMAL LOW (ref 26.0–34.0)
MCHC: 31 g/dL (ref 30.0–36.0)
MCV: 83.4 fL (ref 80.0–100.0)
Platelets: 126 K/uL — ABNORMAL LOW (ref 150–400)
RBC: 4.45 MIL/uL (ref 3.87–5.11)
RDW: 16.7 % — ABNORMAL HIGH (ref 11.5–15.5)
WBC: 4.4 K/uL (ref 4.0–10.5)
nRBC: 0 % (ref 0.0–0.2)

## 2024-11-14 LAB — MAGNESIUM: Magnesium: 2.3 mg/dL (ref 1.7–2.4)

## 2024-11-14 LAB — GLUCOSE, CAPILLARY
Glucose-Capillary: 125 mg/dL — ABNORMAL HIGH (ref 70–99)
Glucose-Capillary: 150 mg/dL — ABNORMAL HIGH (ref 70–99)
Glucose-Capillary: 151 mg/dL — ABNORMAL HIGH (ref 70–99)
Glucose-Capillary: 163 mg/dL — ABNORMAL HIGH (ref 70–99)
Glucose-Capillary: 165 mg/dL — ABNORMAL HIGH (ref 70–99)
Glucose-Capillary: 168 mg/dL — ABNORMAL HIGH (ref 70–99)

## 2024-11-14 LAB — AMMONIA: Ammonia: 46 umol/L — ABNORMAL HIGH (ref 9–35)

## 2024-11-14 NOTE — Progress Notes (Signed)
 TOC Brief Assessment Note Admitted with metabolic encephalopathy likely secondary to hypoglycemic event. No TOC needs at this time.

## 2024-11-14 NOTE — Progress Notes (Signed)
 PROGRESS NOTE Laura [name]    DOB: [birth year], 73 y.o.  FMW:[id number]    Code Status: Full Code   DOA: 11/13/2024   LOS: 0  Brief hospital course  Laura [name] is a 73 y.o. 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 Adherent with lactulose  but has had decreased BM with none on day of presentation.   Patient was admitted to medicine service for further workup and management of metabolic encephalopathy as outlined in detail below.  11/14/24 -improving   Assessment & Plan  Principal Problem:   Acute encephalopathy Active Problems:   Essential hypertension   GERD   Type 2 diabetes mellitus (HCC)   Hepatic encephalopathy (HCC)   Cirrhosis of liver without ascites (HCC)  acute metabolic encephalopathy that is multifactorial including hypoglycemia, mild hepatic encephalopathy with increased ammonia, UTI. She is improved today - hypoglycemia has been corrected for with D5. Continue to monitor.  - ammonia improving with increased lactulose  administration   Acute cystitis as seen on admission UA.  - f/u cultures - continue Abx  Compensated Cirrhosis- - f/u GI outpatient. - follow ammonia - continue lactulose  with goal 3 Bms per day   HTN: hold antihypertensive and monitor blood pressure. Her blood pressure are borderline hypotensive and may need adjustment to her antihypertensives.  - orthostatics tomorrow    GERD: continue home PPI.    T2DM: on insulin  outpatient. Given hypoglycemic event, will monitor glucose and hold starting off SSI at this time but if glucose is consistently elevated, will start SSI.  There is no height or weight on file to calculate BMI.  VTE ppx: heparin  injection 5,000 Units Start: 11/13/24 2200  Diet:     Diet   Diet Heart Room service appropriate? Yes; Fluid consistency: Thin   Consultants: None   Subjective 11/14/24    Pt reports  feeling improved. Denies nausea, pain. No BM today yet   Objective  Blood pressure (!) 136/55, pulse (!) 56, temperature 97.9 F (36.6 C), resp. rate 16, SpO2 95%.  Intake/Output Summary (Last 24 hours) at 11/14/2024 9278 Last data filed at 11/13/2024 2243 Gross per 24 hour  Intake 109.67 ml  Output --  Net 109.67 ml   There were no vitals filed for this visit.   Physical Exam:  General: awake, alert, NAD HEENT: atraumatic, clear conjunctiva, anicteric sclera, MMM, hearing grossly normal Respiratory: normal respiratory effort. Cardiovascular: extremities well perfused, quick capillary refill, normal S1/S2, RRR, no JVD, murmurs Gastrointestinal: soft, NT, ND Nervous: A&O x3. no gross focal neurologic deficits, normal speech Extremities: moves all equally, no edema, normal tone Skin: dry, intact, normal temperature, normal color. No rashes, lesions or ulcers on exposed skin Psychiatry: normal mood, congruent affect  Labs   I have personally reviewed the following labs and imaging studies CBC    Component Value Date/Time   WBC 4.4 11/14/2024 0558   RBC 4.45 11/14/2024 0558   HGB 11.5 (L) 11/14/2024 0558   HGB 11.3 (L) 10/11/2024 1026   HCT 37.1 11/14/2024 0558   PLT 126 (L) 11/14/2024 0558   PLT 156 10/11/2024 1026   MCV 83.4 11/14/2024 0558   MCH 25.8 (L) 11/14/2024 0558   MCHC 31.0 11/14/2024 0558   RDW 16.7 (H) 11/14/2024 0558   LYMPHSABS 0.8 07/08/2024 1026   MONOABS 0.6 07/08/2024 1026   EOSABS 0.1 07/08/2024 1026   BASOSABS 0.0 07/08/2024 1026  Latest Ref Rng & Units 11/14/2024    5:58 AM 11/13/2024   12:18 PM 06/08/2024    4:01 AM  BMP  Glucose 70 - 99 mg/dL 853  88  861   BUN 8 - 23 mg/dL 21  18  21    Creatinine 0.44 - 1.00 mg/dL 9.12  9.15  9.24   Sodium 135 - 145 mmol/L 141  141  141   Potassium 3.5 - 5.1 mmol/L 4.6  4.1  3.6   Chloride 98 - 111 mmol/L 105  105  103   CO2 22 - 32 mmol/L 28  24  30    Calcium  8.9 - 10.3 mg/dL 9.4  89.8  8.6    No  results found.  Disposition Plan & Communication  Patient status: Observation  Admitted From: Home Planned disposition location: Home Anticipated discharge date: 12/9 pending clinical improvement   Family Communication: none at bedside    Author: Marien LITTIE Piety, DO [facility] 11/14/2024, 7:21 AM   Available by Epic secure chat 7AM-7PM. If 7PM-7AM, please contact night-coverage.  [facility] contact information found on [email or web address].

## 2024-11-14 NOTE — Plan of Care (Signed)

## 2024-11-15 DIAGNOSIS — E162 Hypoglycemia, unspecified: Secondary | ICD-10-CM

## 2024-11-15 DIAGNOSIS — N390 Urinary tract infection, site not specified: Secondary | ICD-10-CM

## 2024-11-15 LAB — COMPREHENSIVE METABOLIC PANEL WITH GFR
ALT: 17 U/L (ref 0–44)
AST: 30 U/L (ref 15–41)
Albumin: 4 g/dL (ref 3.5–5.0)
Alkaline Phosphatase: 80 U/L (ref 38–126)
Anion gap: 9 (ref 5–15)
BUN: 17 mg/dL (ref 8–23)
CO2: 26 mmol/L (ref 22–32)
Calcium: 9.2 mg/dL (ref 8.9–10.3)
Chloride: 104 mmol/L (ref 98–111)
Creatinine, Ser: 0.74 mg/dL (ref 0.44–1.00)
GFR, Estimated: >60 mL/min (ref >60)
Glucose, Bld: 144 mg/dL — ABNORMAL HIGH (ref 70–99)
Potassium: 4.2 mmol/L (ref 3.5–5.1)
Sodium: 139 mmol/L (ref 135–145)
Total Bilirubin: 0.7 mg/dL (ref 0.0–1.2)
Total Protein: 6.7 g/dL (ref 6.5–8.1)

## 2024-11-15 LAB — CBC
HCT: 38.5 % (ref 36.0–46.0)
Hemoglobin: 12.1 g/dL (ref 12.0–15.0)
MCH: 25.7 pg — ABNORMAL LOW (ref 26.0–34.0)
MCHC: 31.4 g/dL (ref 30.0–36.0)
MCV: 81.7 fL (ref 80.0–100.0)
Platelets: 127 K/uL — ABNORMAL LOW (ref 150–400)
RBC: 4.71 MIL/uL (ref 3.87–5.11)
RDW: 16.5 % — ABNORMAL HIGH (ref 11.5–15.5)
WBC: 5.2 K/uL (ref 4.0–10.5)
nRBC: 0 % (ref 0.0–0.2)

## 2024-11-15 LAB — AMMONIA: Ammonia: 58 umol/L — ABNORMAL HIGH (ref 9–35)

## 2024-11-15 LAB — GLUCOSE, CAPILLARY: Glucose-Capillary: 151 mg/dL — ABNORMAL HIGH (ref 70–99)

## 2024-11-15 MED ORDER — LOSARTAN POTASSIUM 50 MG PO TABS
50.0000 mg | ORAL_TABLET | Freq: Every day | ORAL | Status: DC
  Administered 2024-11-15: 50 mg via ORAL
  Filled 2024-11-15: qty 1

## 2024-11-15 MED ORDER — CEPHALEXIN 500 MG PO CAPS: 500.0000 mg | ORAL_CAPSULE | Freq: Three times a day (TID) | ORAL | 0 refills | Status: AC

## 2024-11-15 MED ORDER — SPIRONOLACTONE 25 MG PO TABS
25.0000 mg | ORAL_TABLET | Freq: Every day | ORAL | Status: DC
  Administered 2024-11-15: 25 mg via ORAL
  Filled 2024-11-15: qty 1

## 2024-11-15 MED ORDER — LACTULOSE 10 GM/15ML PO SOLN: 20.0000 g | Freq: Three times a day (TID) | ORAL | 0 refills | Status: AC

## 2024-11-15 NOTE — Progress Notes (Signed)
 Discharge instructions reviewed. Patient sent to the discharge lounge. Leretha Gerald, RN

## 2024-11-15 NOTE — Plan of Care (Signed)

## 2024-11-15 NOTE — Discharge Instructions (Signed)
 Please continue your antibiotic for 3 more days, until it is completed for treating your UTI.   You can stop taking insulin  at this time. Continue with your other diabetes treatments and get your blood sugars/A1c rechecked at your follow up appointment.   Please continue to take your lactulose  medication three times per day at 20mg  each time and increase to 30mg  if not having at least two bowel movements per day. Your goal is to have three per day.

## 2024-11-15 NOTE — Discharge Summary (Signed)
 Physician Discharge Summary  Patient: Laura [name] FMW:[id number] DOB: 1951/02/24   Code Status: Full Code Admit date: 11/13/2024 Discharge date: 11/15/2024 Disposition: Home, No home health services recommended PCP: Null Lynwood FALCON, MD  Recommendations for Outpatient Follow-up:  Follow up with PCP within 1-2 weeks Regarding general hospital follow up and preventative care Recommend checking hgb A1c for goal <8 since discontinuing insulin  Discuss ongoing lactulose  use and nutrition   Discharge Diagnoses:  Principal Problem:   Acute encephalopathy Active Problems:   Essential hypertension   GERD   Type 2 diabetes mellitus (HCC)   Hepatic encephalopathy (HCC)   Cirrhosis of liver without ascites [facility])  Brief Hospital Course Summary: Laura [name] is a 73 y.o. 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 Adherent with lactulose  but has had decreased BM with none on day of presentation.  ED course showed stable vitals.  Recent RUQ US  showed cirrhotic liver without focal lesion.   Her AMS is likely multifactorial given hypoglycemia, hepatic encephalopathy, and UTI. Did not meet sepsis criteria. Glucose was as low as 39 upon presentation.   Hypoglycemia- per her daughter, she has not had a good appetite recently. Had not eaten all day while waiting to be seen in ED. She was initially treated with D50 which was stopped when her glucose recovered to >100. She was able to tolerate PO intake. Her home insulin  was discontinued and glucose remained within acceptable standards. Most recent hgb A1c was 7.2 which is below goal of 8 for age. Her oral agents were continued.   Hepatic encephalopathy- ammonia elevated to 63 on admission. Reportedly had no BM the day pf presentation, only 1 the day before and has not been adherent with her lactulose . She was increased to 20mg  TID and ammonia  improved. Other liver labs relatively stable. Instructions provided for goal of 3BM per day and lactulose  adjustments as needed at home.   UTI- treated with 2 days IV Abx including CTX and transitioned to keflex  at dc to complete 5 day course.   All other chronic conditions were treated with home medications.    Discharge Condition: Good, improved Recommended discharge diet: Regular healthy diet  Consultations: None   Procedures/Studies: None   Discharge Instructions     Discharge patient   Complete by: As directed    Discharge disposition: 01-Home or Self Care   Discharge patient date: 11/15/2024      Allergies as of 11/15/2024       Reactions   Atenolol Palpitations   Clarithromycin Other (See Comments), Nausea And Vomiting   Makes the tongue raw REACTION: nausea   Doxycycline Palpitations   Makes her heart beat fast Other reaction(s): GI Upset (intolerance) Loose bowels REACTION: loose stool   Codeine Itching   Erythromycin Base Nausea And Vomiting   Prednisone Other (See Comments)   Mood swings   Penicillins Rash   Has patient had a PCN reaction causing immediate rash, facial/tongue/throat swelling, SOB or lightheadedness with hypotension: Yes Has patient had a PCN reaction causing severe rash involving mucus membranes or skin necrosis: No Has patient had a PCN reaction that required hospitalization No Has patient had a PCN reaction occurring within the last 10 years: No If all of the above answers are NO, then may proceed with Cephalosporin use. REACTION: rash        Medication List     STOP taking these  medications    NovoLOG  FlexPen 100 UNIT/ML FlexPen Generic drug: insulin  aspart       TAKE these medications    cephALEXin  500 MG capsule Commonly known as: KEFLEX  Take 1 capsule (500 mg total) by mouth 3 (three) times daily for 3 days.   FeroSul 325 (65 Fe) MG tablet Generic drug: ferrous sulfate  Take 1 tablet (325 mg total) by mouth daily  with breakfast.   furosemide  40 MG tablet Commonly known as: Lasix  Take 1 tablet (40 mg total) by mouth daily.   gabapentin  300 MG capsule Commonly known as: NEURONTIN  Take 2 capsules (600 mg total) by mouth 4 (four) times daily.   glipiZIDE  10 MG tablet Commonly known as: GLUCOTROL  Take 1 tablet (10 mg total) by mouth daily before breakfast.   Jardiance  10 MG Tabs tablet Generic drug: empagliflozin  Take 10 mg by mouth daily.   lactulose  10 GM/15ML solution Commonly known as: CHRONULAC  Take 30 mLs (20 g total) by mouth 3 (three) times daily. What changed: how much to take   losartan  50 MG tablet Commonly known as: COZAAR  Take 50 mg by mouth daily.   metFORMIN  500 MG tablet Commonly known as: GLUCOPHAGE  Take 500 mg by mouth 2 (two) times daily.   metoprolol  tartrate 25 MG tablet Commonly known as: LOPRESSOR  Take 1 tablet (25 mg total) by mouth 2 (two) times daily.   pantoprazole  40 MG tablet Commonly known as: PROTONIX  Take 1 tablet (40 mg total) by mouth daily.   rosuvastatin  10 MG tablet Commonly known as: CRESTOR  Take 10 mg by mouth daily.   sertraline  100 MG tablet Commonly known as: ZOLOFT  Take 100 mg by mouth every morning.   spironolactone  25 MG tablet Commonly known as: ALDACTONE  Take 1 tablet (25 mg total) by mouth daily.        Follow-up Information     Valora Lynwood FALCON, MD Follow up.   Specialty: Family Medicine Why: hospital follow up Contact information: [address] [facility] [place] [address] [phone]                 Subjective   Pt reports feeling well. Tolerating normal diet. Ambulating to and from bathroom in her room without concerns. Denies dysuria.   All questions and concerns were addressed at time of discharge.  Objective  Blood pressure (!) 148/66, pulse 69, temperature 98.4 F (36.9 C), resp. rate 16, height 5' 2 (1.575 m), weight 83.7 kg, SpO2 96%.   General: Pt is alert, awake, not in  acute distress Cardiovascular: RRR, S1/S2 +, no rubs, no gallops Respiratory: CTA bilaterally, no wheezing, no rhonchi Abdominal: Soft, NT, ND, bowel sounds + Extremities: no edema, no cyanosis  The results of significant diagnostics from this hospitalization (including imaging, microbiology, ancillary and laboratory) are listed below for reference.   Imaging studies: US  Abdomen Limited RUQ (LIVER/GB) Result Date: 11/11/2024 CLINICAL DATA:  Cirrhosis.  Screening for HCC. EXAM: ULTRASOUND ABDOMEN LIMITED RIGHT UPPER QUADRANT COMPARISON:  01/04/2024 FINDINGS: Gallbladder: Gallstones: None Sludge: None Gallbladder Wall: Within normal limits Pericholecystic fluid: None Sonographic Murphy's Sign: Negative per technologist Common bile duct: Diameter: 3 mm Liver: Parenchymal echogenicity: Diffusely coarsened Contours: Nodular Lesions: None Portal vein: Patent.  Hepatopetal flow Other: None. IMPRESSION: Cirrhotic liver morphology without focal hepatic lesion. Electronically Signed   By: Aliene Lloyd M.D.   On: 11/11/2024 11:25    Labs: Basic Metabolic Panel: Recent Labs  Lab 11/13/24 1218 11/13/24 2307 11/14/24 0558 11/15/24 0559  NA  141  --  141 139  K 4.1  --  4.6 4.2  CL 105  --  105 104  CO2 24  --  28 26  GLUCOSE 88  --  146* 144*  BUN 18  --  21 17  CREATININE 0.84  --  0.87 0.74  CALCIUM  10.1  --  9.4 9.2  MG  --  2.3  --   --    CBC: Recent Labs  Lab 11/13/24 1218 11/14/24 0558 11/15/24 0559  WBC 5.1 4.4 5.2  HGB 12.3 11.5* 12.1  HCT 39.5 37.1 38.5  MCV 82.3 83.4 81.7  PLT 148* 126* 127*   Microbiology: Results for orders placed or performed during the hospital encounter of 11/13/24  Urine Culture (for pregnant, neutropenic or urologic patients or patients with an indwelling urinary catheter)     Status: Abnormal (Preliminary result)   Collection Time: 11/13/24  3:58 PM   Specimen: Urine, Clean Catch  Result Value Ref Range Status   Specimen Description   Final     URINE, CLEAN CATCH Performed at [facility], [address], [place], [place] [address]    Special Requests   Final    NONE Performed at [facility], [address] [place], [place] [address]    Culture >=100,000 COLONIES/mL GRAM NEGATIVE RODS (A)  Final   Report Status PENDING  Incomplete    Time coordinating discharge: Over 30 minutes  Laura LITTIE Piety, MD  [facility] 11/15/2024, 10:33 AM

## 2024-11-16 LAB — URINE CULTURE: Culture: >=100000 — AB

## 2025-01-10 ENCOUNTER — Inpatient Hospital Stay

## 2025-01-10 ENCOUNTER — Telehealth: Payer: Self-pay | Admitting: Oncology

## 2025-01-13 ENCOUNTER — Inpatient Hospital Stay: Admitting: Oncology

## 2025-01-13 ENCOUNTER — Inpatient Hospital Stay

## 2025-02-01 ENCOUNTER — Ambulatory Visit: Admit: 2025-02-01 | Admitting: Internal Medicine

## 2025-02-13 ENCOUNTER — Inpatient Hospital Stay

## 2025-02-16 ENCOUNTER — Inpatient Hospital Stay: Admitting: Oncology

## 2025-02-16 ENCOUNTER — Inpatient Hospital Stay
